# Patient Record
Sex: Female | Born: 1950 | Race: White | Hispanic: No | Marital: Married | Smoking: Former smoker
Health system: Southern US, Community
[De-identification: ages and names within clinical notes are randomized; demographics above are authoritative.]

## PROBLEM LIST (undated history)

## (undated) DIAGNOSIS — F329 Major depressive disorder, single episode, unspecified: Secondary | ICD-10-CM

## (undated) DIAGNOSIS — M797 Fibromyalgia: Secondary | ICD-10-CM

## (undated) DIAGNOSIS — J302 Other seasonal allergic rhinitis: Secondary | ICD-10-CM

## (undated) DIAGNOSIS — C801 Malignant (primary) neoplasm, unspecified: Secondary | ICD-10-CM

## (undated) DIAGNOSIS — K219 Gastro-esophageal reflux disease without esophagitis: Secondary | ICD-10-CM

## (undated) DIAGNOSIS — Z86718 Personal history of other venous thrombosis and embolism: Secondary | ICD-10-CM

## (undated) DIAGNOSIS — J449 Chronic obstructive pulmonary disease, unspecified: Secondary | ICD-10-CM

## (undated) DIAGNOSIS — R0602 Shortness of breath: Secondary | ICD-10-CM

## (undated) DIAGNOSIS — R51 Headache: Secondary | ICD-10-CM

## (undated) DIAGNOSIS — Z9981 Dependence on supplemental oxygen: Secondary | ICD-10-CM

## (undated) DIAGNOSIS — E785 Hyperlipidemia, unspecified: Secondary | ICD-10-CM

## (undated) DIAGNOSIS — I10 Essential (primary) hypertension: Secondary | ICD-10-CM

## (undated) DIAGNOSIS — F419 Anxiety disorder, unspecified: Secondary | ICD-10-CM

## (undated) DIAGNOSIS — E039 Hypothyroidism, unspecified: Secondary | ICD-10-CM

## (undated) DIAGNOSIS — J189 Pneumonia, unspecified organism: Secondary | ICD-10-CM

## (undated) DIAGNOSIS — F32A Depression, unspecified: Secondary | ICD-10-CM

## (undated) DIAGNOSIS — M419 Scoliosis, unspecified: Secondary | ICD-10-CM

## (undated) DIAGNOSIS — IMO0001 Reserved for inherently not codable concepts without codable children: Secondary | ICD-10-CM

## (undated) DIAGNOSIS — M1990 Unspecified osteoarthritis, unspecified site: Secondary | ICD-10-CM

## (undated) HISTORY — PX: KNEE ARTHROTOMY: SUR107

---
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
## 1975-11-24 HISTORY — PX: BARTHOLIN GLAND CYST EXCISION: SHX565

## 1986-11-23 HISTORY — PX: HEMORRHOID SURGERY: SHX153

## 1987-11-24 HISTORY — PX: DIAGNOSTIC LAPAROSCOPY: SUR761

## 1988-11-23 HISTORY — PX: ABDOMINAL HYSTERECTOMY: SHX81

## 1990-11-23 HISTORY — PX: OTHER SURGICAL HISTORY: SHX169

## 1999-03-11 ENCOUNTER — Ambulatory Visit (HOSPITAL_COMMUNITY): Admission: RE | Admit: 1999-03-11 | Discharge: 1999-03-11 | Payer: Self-pay | Admitting: Orthopedic Surgery

## 1999-08-28 ENCOUNTER — Ambulatory Visit (HOSPITAL_COMMUNITY): Admission: RE | Admit: 1999-08-28 | Discharge: 1999-08-28 | Payer: Self-pay | Admitting: Neurosurgery

## 1999-09-11 ENCOUNTER — Ambulatory Visit (HOSPITAL_COMMUNITY): Admission: RE | Admit: 1999-09-11 | Discharge: 1999-09-11 | Payer: Self-pay | Admitting: Neurosurgery

## 1999-10-02 ENCOUNTER — Ambulatory Visit (HOSPITAL_COMMUNITY): Admission: RE | Admit: 1999-10-02 | Discharge: 1999-10-02 | Payer: Self-pay | Admitting: Neurosurgery

## 1999-11-19 ENCOUNTER — Ambulatory Visit (HOSPITAL_COMMUNITY): Admission: RE | Admit: 1999-11-19 | Discharge: 1999-11-19 | Payer: Self-pay | Admitting: Gastroenterology

## 1999-11-19 ENCOUNTER — Encounter (INDEPENDENT_AMBULATORY_CARE_PROVIDER_SITE_OTHER): Payer: Self-pay | Admitting: *Deleted

## 2001-03-30 ENCOUNTER — Ambulatory Visit (HOSPITAL_COMMUNITY): Admission: RE | Admit: 2001-03-30 | Discharge: 2001-03-30 | Payer: Self-pay | Admitting: Gastroenterology

## 2003-04-24 ENCOUNTER — Encounter: Admission: RE | Admit: 2003-04-24 | Discharge: 2003-04-24 | Payer: Self-pay | Admitting: Orthopedic Surgery

## 2003-04-24 ENCOUNTER — Encounter: Payer: Self-pay | Admitting: Orthopedic Surgery

## 2004-11-01 ENCOUNTER — Emergency Department (HOSPITAL_COMMUNITY): Admission: EM | Admit: 2004-11-01 | Discharge: 2004-11-01 | Payer: Self-pay | Admitting: Emergency Medicine

## 2006-11-01 ENCOUNTER — Encounter: Admission: RE | Admit: 2006-11-01 | Discharge: 2006-11-01 | Payer: Self-pay | Admitting: Family Medicine

## 2006-11-22 ENCOUNTER — Encounter: Admission: RE | Admit: 2006-11-22 | Discharge: 2006-11-22 | Payer: Self-pay | Admitting: Family Medicine

## 2007-11-24 HISTORY — PX: THYROID LOBECTOMY: SHX420

## 2008-06-18 ENCOUNTER — Encounter: Admission: RE | Admit: 2008-06-18 | Discharge: 2008-06-18 | Payer: Self-pay | Admitting: Gastroenterology

## 2008-06-22 ENCOUNTER — Encounter: Admission: RE | Admit: 2008-06-22 | Discharge: 2008-06-22 | Payer: Self-pay | Admitting: Gastroenterology

## 2008-10-26 ENCOUNTER — Ambulatory Visit (HOSPITAL_BASED_OUTPATIENT_CLINIC_OR_DEPARTMENT_OTHER): Admission: RE | Admit: 2008-10-26 | Discharge: 2008-10-26 | Payer: Self-pay | Admitting: Orthopedic Surgery

## 2008-10-26 HISTORY — PX: KNEE ARTHROSCOPY: SUR90

## 2010-07-31 ENCOUNTER — Inpatient Hospital Stay (HOSPITAL_COMMUNITY): Admission: RE | Admit: 2010-07-31 | Discharge: 2010-08-05 | Payer: Self-pay | Admitting: Orthopedic Surgery

## 2010-07-31 HISTORY — PX: TOTAL KNEE ARTHROPLASTY: SHX125

## 2010-09-10 ENCOUNTER — Ambulatory Visit (HOSPITAL_BASED_OUTPATIENT_CLINIC_OR_DEPARTMENT_OTHER): Admission: RE | Admit: 2010-09-10 | Discharge: 2010-09-10 | Payer: Self-pay | Admitting: Orthopedic Surgery

## 2010-09-10 HISTORY — PX: OTHER SURGICAL HISTORY: SHX169

## 2010-10-28 ENCOUNTER — Encounter: Admission: RE | Admit: 2010-10-28 | Discharge: 2010-10-28 | Payer: Self-pay | Admitting: Orthopedic Surgery

## 2010-12-15 ENCOUNTER — Encounter: Payer: Self-pay | Admitting: Gastroenterology

## 2011-02-05 LAB — PROTIME-INR
INR: 1.41 (ref 0.00–1.49)
INR: 1.76 — ABNORMAL HIGH (ref 0.00–1.49)
INR: 2.38 — ABNORMAL HIGH (ref 0.00–1.49)
Prothrombin Time: 17.5 seconds — ABNORMAL HIGH (ref 11.6–15.2)

## 2011-02-05 LAB — CBC
HCT: 30.5 % — ABNORMAL LOW (ref 36.0–46.0)
HCT: 31.3 % — ABNORMAL LOW (ref 36.0–46.0)
Hemoglobin: 10.1 g/dL — ABNORMAL LOW (ref 12.0–15.0)
Hemoglobin: 10.5 g/dL — ABNORMAL LOW (ref 12.0–15.0)
Hemoglobin: 11.1 g/dL — ABNORMAL LOW (ref 12.0–15.0)
Hemoglobin: 12.4 g/dL (ref 12.0–15.0)
MCH: 30.9 pg (ref 26.0–34.0)
MCH: 31 pg (ref 26.0–34.0)
MCH: 31.4 pg (ref 26.0–34.0)
MCV: 91.2 fL (ref 78.0–100.0)
MCV: 93.1 fL (ref 78.0–100.0)
Platelets: 154 10*3/uL (ref 150–400)
Platelets: 173 10*3/uL (ref 150–400)
Platelets: 189 10*3/uL (ref 150–400)
RBC: 3.59 MIL/uL — ABNORMAL LOW (ref 3.87–5.11)
RBC: 4.01 MIL/uL (ref 3.87–5.11)
WBC: 10.3 10*3/uL (ref 4.0–10.5)
WBC: 14.1 10*3/uL — ABNORMAL HIGH (ref 4.0–10.5)
WBC: 16.1 10*3/uL — ABNORMAL HIGH (ref 4.0–10.5)

## 2011-02-05 LAB — RAPID URINE DRUG SCREEN, HOSP PERFORMED
Amphetamines: NOT DETECTED
Barbiturates: POSITIVE — AB
Benzodiazepines: POSITIVE — AB
Opiates: POSITIVE — AB

## 2011-02-05 LAB — BASIC METABOLIC PANEL
BUN: 6 mg/dL (ref 6–23)
Calcium: 8.2 mg/dL — ABNORMAL LOW (ref 8.4–10.5)
GFR calc Af Amer: >60 mL/min (ref >60)
GFR calc non Af Amer: >60 mL/min (ref >60)
Potassium: 3.7 mEq/L (ref 3.5–5.1)
Sodium: 141 mEq/L (ref 135–145)

## 2011-02-06 LAB — CBC
HCT: 42.4 % (ref 36.0–46.0)
Hemoglobin: 14.2 g/dL (ref 12.0–15.0)
MCV: 92.1 fL (ref 78.0–100.0)
RBC: 4.6 MIL/uL (ref 3.87–5.11)
RDW: 17.4 % — ABNORMAL HIGH (ref 11.5–15.5)
WBC: 13.9 10*3/uL — ABNORMAL HIGH (ref 4.0–10.5)

## 2011-02-06 LAB — COMPREHENSIVE METABOLIC PANEL
Albumin: 3.1 g/dL — ABNORMAL LOW (ref 3.5–5.2)
Alkaline Phosphatase: 112 U/L (ref 39–117)
BUN: 10 mg/dL (ref 6–23)
CO2: 35 mEq/L — ABNORMAL HIGH (ref 19–32)
Chloride: 102 mEq/L (ref 96–112)
GFR calc non Af Amer: >60 mL/min (ref >60)
Potassium: 4.7 mEq/L (ref 3.5–5.1)
Total Bilirubin: 0.3 mg/dL (ref 0.3–1.2)

## 2011-02-06 LAB — SURGICAL PCR SCREEN: Staphylococcus aureus: POSITIVE — AB

## 2011-04-07 NOTE — Op Note (Signed)
NAMEDONNETTE, MACMULLEN               ACCOUNT NO.:  [id number]   MEDICAL RECORD NO.:  [id number]          PATIENT TYPE:  AMB   LOCATION:  [facility]                         FACILITY:  [facility]   PHYSICIAN:  Marlowe Kays, M.D.  DATE OF BIRTH:  08/31/1951   DATE OF PROCEDURE:  10/26/2008  DATE OF DISCHARGE:                               OPERATIVE REPORT   PREOPERATIVE DIAGNOSES:  1. Two retained painful tibial screws at tibial tubercle.  2. Internal derangement right knee with suspected lateral loose body.   POSTOPERATIVE DIAGNOSES:  1. Osteoarthritis.  2. Torn medial and lateral menisci.  3. Large osteophyte/fibrous attached loose body lateral patella.  4. Retained painful tibial screws, right knee.   OPERATION:  1. Right knee arthroscopy with partial medial and lateral meniscectomy      and removal of the large osteophyte/loose body of patella.  2. Removal of 2 screws, proximal tibia.   SURGEON:  Dr. Simonne Come.   ASSISTANT:  Nurse.   ANESTHESIA:  General.   PATHOLOGY/JUSTIFICATION FOR PROCEDURE:  She has had 2 prior orthopedic  procedures on her right knee with at age 69 and the last 9 years ago at  age 33 when she had a transfer of the tibial tubercle.  She has 2  painful screws there.  Also has a very protruding bony fragment from the  lateral patella which appeared to be intra-articular.  She also has  chronic pain problems with fibromyalgia.  She is having enough pain,  however, from these 2 that I felt that surgical correction was  appropriate.   PROCEDURE:  After satisfactory general anesthesia, Ace wrap and knee  stabilizer to the left lower extremity, right lower extremity tourniquet  applied and the leg was Esmarched out nonsterilely.  Thigh stabilizer  applied and the leg prepped with DuraPrep from stabilizer to ankle and  draped in sterile field.  Superior medial saline inflow, first through  an anteromedial portal, the lateral compartment knee joint was  evaluated.   She had either calcium or steroid stippling over a  thoroughly worn lateral femoral condyle.  Also, this was coating her  entire lateral meniscus which was badly torn throughout the posterior  half.  I resected this back to stable rim with baskets and shaved it  down until smooth with a 3.5 shaver.  Looking at the lateral gutter and  suprapatellar area, she had a large bony fragment from the lateral  patella which was movable with a fibrous union to the patella.  I  removed this using both anterior medial anterolateral portals with a  combination of baskets, 4-mm oval bur, and the 3.5 shaver with pre- and  post-films being taken.  She had full-thickness wear of her patella  which did not require shaving.  I then reversed portals.  In the medial  joint, she had a good bit of synovitis anteromedially.  She had a tear  with some of the same calcium-type deposition of the posterior curve and  posterior horn which I smoothed back to stable rim with a 3.5 shaver.  I  looked in the anterior joint from  both portals and did not find any  fragments, and I think the bone projection seen on plain x-rays was an  osteophyte and not in the joint and not causing any mechanical problems.  I then irrigated her knee joint until clear and closed the 2 anterior  portals with a 4-0 nylon.  I then injected through the inflow apparatus  20 mL of 0.50% Marcaine with adrenaline and 4 mg of morphine and closed  this portal with 4-0 nylon as well.  I then made 2 vertical incisions  over the 2 protuberant screws and removed them.  They will be safe for  the patient.  These wounds were likewise infiltrated with Marcaine and  morphine and closed with interrupted 4-0 nylon.  Betadine, Adaptic, and  dry dressing were applied.  She was given 30 mg of Toradol IV, placed in  a knee immobilizer, and at the time of this dictation was on her way to  recovery in satisfactory condition with no known complications.            ______________________________  Marlowe Kays, M.D.     JA/MEDQ  D:  10/26/2008  T:  10/26/2008  Job:  [id number]

## 2011-05-12 ENCOUNTER — Other Ambulatory Visit (HOSPITAL_COMMUNITY): Payer: Self-pay | Admitting: Family Medicine

## 2011-05-12 DIAGNOSIS — M898X1 Other specified disorders of bone, shoulder: Secondary | ICD-10-CM

## 2011-05-20 ENCOUNTER — Encounter (HOSPITAL_COMMUNITY)
Admission: RE | Admit: 2011-05-20 | Discharge: 2011-05-20 | Disposition: A | Discharge status: Home / Self Care | Payer: 59 | Source: Ambulatory Visit | Attending: Family Medicine | Admitting: Family Medicine

## 2011-05-20 DIAGNOSIS — M898X1 Other specified disorders of bone, shoulder: Secondary | ICD-10-CM

## 2011-05-20 DIAGNOSIS — M25519 Pain in unspecified shoulder: Secondary | ICD-10-CM | POA: Insufficient documentation

## 2011-05-20 MED ORDER — TECHNETIUM TC 99M MEDRONATE IV KIT
25.0000 | PACK | Freq: Once | INTRAVENOUS | Status: AC | PRN
  Administered 2011-05-20: 25 via INTRAVENOUS

## 2011-08-28 LAB — POCT HEMOGLOBIN-HEMACUE: Hemoglobin: 15.4 g/dL — ABNORMAL HIGH (ref 12.0–15.0)

## 2011-09-28 DIAGNOSIS — J383 Other diseases of vocal cords: Secondary | ICD-10-CM | POA: Insufficient documentation

## 2011-09-28 DIAGNOSIS — R490 Dysphonia: Secondary | ICD-10-CM | POA: Insufficient documentation

## 2012-02-23 ENCOUNTER — Other Ambulatory Visit: Payer: Self-pay | Admitting: Orthopedic Surgery

## 2012-02-23 ENCOUNTER — Encounter (HOSPITAL_COMMUNITY): Payer: Self-pay | Admitting: Pharmacy Technician

## 2012-02-29 ENCOUNTER — Encounter (HOSPITAL_COMMUNITY): Payer: Self-pay

## 2012-02-29 ENCOUNTER — Encounter (HOSPITAL_COMMUNITY)
Admission: RE | Admit: 2012-02-29 | Discharge: 2012-02-29 | Disposition: A | Discharge status: Home / Self Care | Payer: 59 | Source: Ambulatory Visit | Attending: Orthopedic Surgery | Admitting: Orthopedic Surgery

## 2012-02-29 ENCOUNTER — Ambulatory Visit (HOSPITAL_COMMUNITY)
Admission: RE | Admit: 2012-02-29 | Discharge: 2012-02-29 | Disposition: A | Discharge status: Home / Self Care | Payer: 59 | Source: Ambulatory Visit | Attending: Orthopedic Surgery | Admitting: Orthopedic Surgery

## 2012-02-29 ENCOUNTER — Ambulatory Visit (HOSPITAL_COMMUNITY): Payer: 59

## 2012-02-29 ENCOUNTER — Other Ambulatory Visit: Payer: Self-pay

## 2012-02-29 DIAGNOSIS — J4489 Other specified chronic obstructive pulmonary disease: Secondary | ICD-10-CM | POA: Insufficient documentation

## 2012-02-29 DIAGNOSIS — Z01812 Encounter for preprocedural laboratory examination: Secondary | ICD-10-CM | POA: Insufficient documentation

## 2012-02-29 DIAGNOSIS — Z01818 Encounter for other preprocedural examination: Secondary | ICD-10-CM | POA: Insufficient documentation

## 2012-02-29 DIAGNOSIS — M4120 Other idiopathic scoliosis, site unspecified: Secondary | ICD-10-CM | POA: Insufficient documentation

## 2012-02-29 DIAGNOSIS — J984 Other disorders of lung: Secondary | ICD-10-CM | POA: Insufficient documentation

## 2012-02-29 DIAGNOSIS — Z01810 Encounter for preprocedural cardiovascular examination: Secondary | ICD-10-CM | POA: Insufficient documentation

## 2012-02-29 DIAGNOSIS — R9431 Abnormal electrocardiogram [ECG] [EKG]: Secondary | ICD-10-CM | POA: Insufficient documentation

## 2012-02-29 DIAGNOSIS — J449 Chronic obstructive pulmonary disease, unspecified: Secondary | ICD-10-CM | POA: Insufficient documentation

## 2012-02-29 DIAGNOSIS — I498 Other specified cardiac arrhythmias: Secondary | ICD-10-CM | POA: Insufficient documentation

## 2012-02-29 HISTORY — DX: Hyperlipidemia, unspecified: E78.5

## 2012-02-29 HISTORY — DX: Fibromyalgia: M79.7

## 2012-02-29 HISTORY — DX: Hypothyroidism, unspecified: E03.9

## 2012-02-29 HISTORY — DX: Other seasonal allergic rhinitis: J30.2

## 2012-02-29 HISTORY — DX: Major depressive disorder, single episode, unspecified: F32.9

## 2012-02-29 HISTORY — DX: Shortness of breath: R06.02

## 2012-02-29 HISTORY — DX: Headache: R51

## 2012-02-29 HISTORY — DX: Depression, unspecified: F32.A

## 2012-02-29 HISTORY — DX: Pneumonia, unspecified organism: J18.9

## 2012-02-29 HISTORY — DX: Anxiety disorder, unspecified: F41.9

## 2012-02-29 HISTORY — DX: Gastro-esophageal reflux disease without esophagitis: K21.9

## 2012-02-29 HISTORY — DX: Chronic obstructive pulmonary disease, unspecified: J44.9

## 2012-02-29 LAB — CBC
MCH: 30 pg (ref 26.0–34.0)
MCHC: 32.6 g/dL (ref 30.0–36.0)
Platelets: 252 10*3/uL (ref 150–400)

## 2012-02-29 LAB — SURGICAL PCR SCREEN
MRSA, PCR: POSITIVE — AB
Staphylococcus aureus: POSITIVE — AB

## 2012-02-29 LAB — BASIC METABOLIC PANEL
Calcium: 8.5 mg/dL (ref 8.4–10.5)
GFR calc Af Amer: >90 mL/min (ref >90)
GFR calc non Af Amer: >90 mL/min (ref >90)
Sodium: 143 mEq/L (ref 135–145)

## 2012-02-29 NOTE — Patient Instructions (Signed)
[address]  02/29/2012   Your procedure is scheduled on:  03/02/12   Surgery    1200-1330  Wednesday      Report to [facility] at     1000  AM.  Call this number if you have problems the morning of surgery: [phone]     Or PST   [id number]  Deliah Goody   Remember:              Janan Halter WITH YOU TO HOSPITAL  Do not eat food:After Midnight.tuesday night  May have clear liquids: until midnight Tuesday NIGHT  Clear liquids include soda, tea, black coffee, apple or grape juice, broth.  Take these medicines the morning of surgery with A SIP OF WATER:  ACIPHEX,SYNTHROID,WELLBUTRIN, ALLEGRA, LEXAPRO, NEURONTIN                              MAY TAKE   MORPHINE, IR, ROBAXIN, FENTANYL patch-      MAY TAKE KLONIPIN  IF DR APLINGTON HAS NOT GIVEN ANY OTHER ANXIETY MED   Do not wear jewelry, make-up or nail polish.  Do not wear lotions, powders, or perfumes. You may wear deodorant.  Do not shave 48 hours prior to surgery.  Do not bring valuables to the hospital.  Contacts, dentures or bridgework may not be worn into surgery.  Leave suitcase in the car. After surgery it may be brought to your room.  For patients admitted to the hospital, checkout time is 11:00 AM the day of discharge.   Patients discharged the day of surgery will not be allowed to drive home.  Name and phone number of your driver:                                                                      Special Instructions: CHG Shower Use Special Wash: 1/2 bottle night before surgery and 1/2 bottle morning of surgery. REGULAR SOAP FACE AND PRIVATES              LADIES- NO SHAVING 48 HOURS BEFORE USING BETASEPT SOAP.                 MEN-MAY SHAVE FACE MORNING OF SURGERY  Please read over the following fact sheets that you were given: MRSA Information

## 2012-03-01 ENCOUNTER — Encounter (HOSPITAL_COMMUNITY): Payer: Self-pay

## 2012-03-01 NOTE — Pre-Procedure Instructions (Signed)
Spoke with husband at his request- states [name] was sleeping- notified him of pos nasal swab and she needs to  Begin Mupirocin today as instructed and utilize good handwashing

## 2012-03-01 NOTE — Pre-Procedure Instructions (Signed)
Preliminary EKG report reviewed with old EKG from 8/11 pre Dr Payton Doughty -   No final copy as of yet-   OK for OR

## 2012-03-02 ENCOUNTER — Encounter (HOSPITAL_COMMUNITY)
Admission: RE | Disposition: A | Discharge status: Home / Self Care | Payer: Self-pay | Source: Ambulatory Visit | Attending: Orthopedic Surgery

## 2012-03-02 ENCOUNTER — Ambulatory Visit (HOSPITAL_COMMUNITY): Payer: 59 | Admitting: Anesthesiology

## 2012-03-02 ENCOUNTER — Ambulatory Visit (HOSPITAL_COMMUNITY)
Admission: RE | Admit: 2012-03-02 | Discharge: 2012-03-03 | Disposition: A | Discharge status: Home / Self Care | Payer: 59 | Source: Ambulatory Visit | Attending: Orthopedic Surgery | Admitting: Orthopedic Surgery

## 2012-03-02 ENCOUNTER — Encounter (HOSPITAL_COMMUNITY): Payer: Self-pay | Admitting: Anesthesiology

## 2012-03-02 ENCOUNTER — Encounter (HOSPITAL_COMMUNITY): Payer: Self-pay | Admitting: *Deleted

## 2012-03-02 DIAGNOSIS — J4489 Other specified chronic obstructive pulmonary disease: Secondary | ICD-10-CM | POA: Insufficient documentation

## 2012-03-02 DIAGNOSIS — Z9889 Other specified postprocedural states: Secondary | ICD-10-CM

## 2012-03-02 DIAGNOSIS — M25819 Other specified joint disorders, unspecified shoulder: Secondary | ICD-10-CM | POA: Insufficient documentation

## 2012-03-02 DIAGNOSIS — M719 Bursopathy, unspecified: Secondary | ICD-10-CM | POA: Insufficient documentation

## 2012-03-02 DIAGNOSIS — Z79899 Other long term (current) drug therapy: Secondary | ICD-10-CM | POA: Insufficient documentation

## 2012-03-02 DIAGNOSIS — E039 Hypothyroidism, unspecified: Secondary | ICD-10-CM | POA: Insufficient documentation

## 2012-03-02 DIAGNOSIS — E785 Hyperlipidemia, unspecified: Secondary | ICD-10-CM | POA: Insufficient documentation

## 2012-03-02 DIAGNOSIS — M67919 Unspecified disorder of synovium and tendon, unspecified shoulder: Secondary | ICD-10-CM | POA: Insufficient documentation

## 2012-03-02 DIAGNOSIS — K219 Gastro-esophageal reflux disease without esophagitis: Secondary | ICD-10-CM | POA: Insufficient documentation

## 2012-03-02 DIAGNOSIS — J449 Chronic obstructive pulmonary disease, unspecified: Secondary | ICD-10-CM | POA: Insufficient documentation

## 2012-03-02 DIAGNOSIS — M24119 Other articular cartilage disorders, unspecified shoulder: Secondary | ICD-10-CM | POA: Insufficient documentation

## 2012-03-02 DIAGNOSIS — IMO0001 Reserved for inherently not codable concepts without codable children: Secondary | ICD-10-CM | POA: Insufficient documentation

## 2012-03-02 HISTORY — PX: OTHER SURGICAL HISTORY: SHX169

## 2012-03-02 SURGERY — SHOULDER ARTHROSCOPY WITH SUBACROMIAL DECOMPRESSION
Anesthesia: General | Site: Shoulder | Laterality: Left | Wound class: Clean

## 2012-03-02 MED ORDER — LACTATED RINGERS IV SOLN
INTRAVENOUS | Status: DC
  Administered 2012-03-02: 1000 mL via INTRAVENOUS

## 2012-03-02 MED ORDER — LORATADINE 10 MG PO TABS
10.0000 mg | ORAL_TABLET | Freq: Every day | ORAL | Status: DC
  Filled 2012-03-02 (×2): qty 1

## 2012-03-02 MED ORDER — MORPHINE SULFATE 15 MG PO TABS
15.0000 mg | ORAL_TABLET | ORAL | Status: DC | PRN
  Administered 2012-03-03: 15 mg via ORAL
  Filled 2012-03-02: qty 1

## 2012-03-02 MED ORDER — PHENYLEPHRINE HCL 10 MG/ML IJ SOLN
INTRAMUSCULAR | Status: DC | PRN
  Administered 2012-03-02: 80 ug via INTRAVENOUS
  Administered 2012-03-02: 160 ug via INTRAVENOUS

## 2012-03-02 MED ORDER — PROPOFOL 10 MG/ML IV EMUL
INTRAVENOUS | Status: DC | PRN
  Administered 2012-03-02: 200 mg via INTRAVENOUS

## 2012-03-02 MED ORDER — FENTANYL 75 MCG/HR TD PT72: 75.0000 ug | MEDICATED_PATCH | TRANSDERMAL | Status: DC

## 2012-03-02 MED ORDER — ONDANSETRON HCL 4 MG/2ML IJ SOLN
INTRAMUSCULAR | Status: DC | PRN
  Administered 2012-03-02: 4 mg via INTRAVENOUS

## 2012-03-02 MED ORDER — SUMATRIPTAN SUCCINATE 50 MG PO TABS
50.0000 mg | ORAL_TABLET | Freq: Once | ORAL | Status: AC
  Administered 2012-03-02: 50 mg via ORAL
  Filled 2012-03-02: qty 1

## 2012-03-02 MED ORDER — ACETAMINOPHEN 650 MG RE SUPP: 650.0000 mg | Freq: Four times a day (QID) | RECTAL | Status: DC | PRN

## 2012-03-02 MED ORDER — ACETAMINOPHEN 325 MG PO TABS: 650.0000 mg | ORAL_TABLET | Freq: Four times a day (QID) | ORAL | Status: DC | PRN

## 2012-03-02 MED ORDER — EPINEPHRINE HCL 1 MG/ML IJ SOLN
INTRAMUSCULAR | Status: AC
  Filled 2012-03-02: qty 2

## 2012-03-02 MED ORDER — LEVOTHYROXINE SODIUM 25 MCG PO TABS
25.0000 ug | ORAL_TABLET | Freq: Every day | ORAL | Status: DC
  Administered 2012-03-03: 25 ug via ORAL
  Filled 2012-03-02: qty 1

## 2012-03-02 MED ORDER — PHENOL 1.4 % MT LIQD: 1.0000 | OROMUCOSAL | Status: DC | PRN

## 2012-03-02 MED ORDER — EPINEPHRINE HCL 1 MG/ML IJ SOLN
INTRAMUSCULAR | Status: DC | PRN
  Administered 2012-03-02: 1 mg

## 2012-03-02 MED ORDER — PROPOFOL 10 MG/ML IV EMUL
INTRAVENOUS | Status: DC | PRN
  Administered 2012-03-02: 50 ug/kg/min via INTRAVENOUS

## 2012-03-02 MED ORDER — ONDANSETRON HCL 4 MG PO TABS: 4.0000 mg | ORAL_TABLET | Freq: Four times a day (QID) | ORAL | Status: DC | PRN

## 2012-03-02 MED ORDER — CLONAZEPAM 0.5 MG PO TABS
0.5000 mg | ORAL_TABLET | Freq: Two times a day (BID) | ORAL | Status: DC | PRN
  Administered 2012-03-02: 0.5 mg via ORAL
  Filled 2012-03-02: qty 1

## 2012-03-02 MED ORDER — HYDROMORPHONE HCL PF 1 MG/ML IJ SOLN
INTRAMUSCULAR | Status: AC
  Filled 2012-03-02: qty 1

## 2012-03-02 MED ORDER — MEPERIDINE HCL 50 MG/ML IJ SOLN: 6.2500 mg | INTRAMUSCULAR | Status: DC | PRN

## 2012-03-02 MED ORDER — DEXTROSE IN LACTATED RINGERS 5 % IV SOLN
INTRAVENOUS | Status: DC
  Administered 2012-03-02: 1000 mL via INTRAVENOUS

## 2012-03-02 MED ORDER — ONDANSETRON HCL 4 MG/2ML IJ SOLN: 4.0000 mg | Freq: Four times a day (QID) | INTRAMUSCULAR | Status: DC | PRN

## 2012-03-02 MED ORDER — SUFENTANIL CITRATE 50 MCG/ML IV SOLN
INTRAVENOUS | Status: DC | PRN
  Administered 2012-03-02: 10 ug via INTRAVENOUS
  Administered 2012-03-02: 5 ug via INTRAVENOUS
  Administered 2012-03-02 (×2): 10 ug via INTRAVENOUS

## 2012-03-02 MED ORDER — PROMETHAZINE HCL 25 MG/ML IJ SOLN: 6.2500 mg | INTRAMUSCULAR | Status: DC | PRN

## 2012-03-02 MED ORDER — MIDAZOLAM HCL 2 MG/2ML IJ SOLN
INTRAMUSCULAR | Status: AC
  Filled 2012-03-02: qty 2

## 2012-03-02 MED ORDER — LIDOCAINE HCL (CARDIAC) 20 MG/ML IV SOLN
INTRAVENOUS | Status: DC | PRN
  Administered 2012-03-02: 50 mg via INTRAVENOUS

## 2012-03-02 MED ORDER — CEFAZOLIN SODIUM 1-5 GM-% IV SOLN
1.0000 g | INTRAVENOUS | Status: AC
  Administered 2012-03-02: 1 g via INTRAVENOUS

## 2012-03-02 MED ORDER — GABAPENTIN 300 MG PO CAPS
300.0000 mg | ORAL_CAPSULE | Freq: Two times a day (BID) | ORAL | Status: DC
  Administered 2012-03-02: 300 mg via ORAL
  Filled 2012-03-02 (×3): qty 1

## 2012-03-02 MED ORDER — MENTHOL 3 MG MT LOZG: 1.0000 | LOZENGE | OROMUCOSAL | Status: DC | PRN

## 2012-03-02 MED ORDER — METHOCARBAMOL 500 MG PO TABS
750.0000 mg | ORAL_TABLET | ORAL | Status: DC | PRN
  Administered 2012-03-02 – 2012-03-03 (×4): 750 mg via ORAL
  Filled 2012-03-02: qty 1
  Filled 2012-03-02: qty 1.5
  Filled 2012-03-02: qty 2
  Filled 2012-03-02: qty 1
  Filled 2012-03-02: qty 2

## 2012-03-02 MED ORDER — OXYCODONE HCL 5 MG PO TABS
5.0000 mg | ORAL_TABLET | ORAL | Status: DC | PRN
  Administered 2012-03-02 – 2012-03-03 (×6): 10 mg via ORAL
  Filled 2012-03-02 (×6): qty 2

## 2012-03-02 MED ORDER — SUCCINYLCHOLINE CHLORIDE 20 MG/ML IJ SOLN
INTRAMUSCULAR | Status: DC | PRN
  Administered 2012-03-02: 80 mg via INTRAVENOUS

## 2012-03-02 MED ORDER — METOCLOPRAMIDE HCL 10 MG PO TABS: 5.0000 mg | ORAL_TABLET | Freq: Three times a day (TID) | ORAL | Status: DC | PRN

## 2012-03-02 MED ORDER — ALBUTEROL SULFATE HFA 108 (90 BASE) MCG/ACT IN AERS
2.0000 | INHALATION_SPRAY | Freq: Four times a day (QID) | RESPIRATORY_TRACT | Status: DC | PRN
  Filled 2012-03-02: qty 6.7

## 2012-03-02 MED ORDER — HYDROMORPHONE HCL PF 1 MG/ML IJ SOLN
0.2500 mg | INTRAMUSCULAR | Status: DC | PRN
  Administered 2012-03-02 (×4): 0.5 mg via INTRAVENOUS

## 2012-03-02 MED ORDER — HYDROMORPHONE HCL PF 1 MG/ML IJ SOLN
INTRAMUSCULAR | Status: DC | PRN
  Administered 2012-03-02 (×2): 1 mg via INTRAVENOUS

## 2012-03-02 MED ORDER — LACTATED RINGERS IV SOLN: INTRAVENOUS | Status: DC

## 2012-03-02 MED ORDER — KETAMINE HCL 10 MG/ML IJ SOLN
INTRAMUSCULAR | Status: DC | PRN
  Administered 2012-03-02: 30 mg via INTRAVENOUS

## 2012-03-02 MED ORDER — ESCITALOPRAM OXALATE 20 MG PO TABS
20.0000 mg | ORAL_TABLET | Freq: Every morning | ORAL | Status: DC
  Filled 2012-03-02: qty 1

## 2012-03-02 MED ORDER — QUETIAPINE FUMARATE 100 MG PO TABS
100.0000 mg | ORAL_TABLET | Freq: Every day | ORAL | Status: DC
  Administered 2012-03-02: 100 mg via ORAL
  Filled 2012-03-02 (×2): qty 1

## 2012-03-02 MED ORDER — MIDAZOLAM HCL 2 MG/2ML IJ SOLN
1.0000 mg | INTRAMUSCULAR | Status: DC | PRN
  Administered 2012-03-02: 2 mg via INTRAVENOUS

## 2012-03-02 MED ORDER — METOCLOPRAMIDE HCL 5 MG/ML IJ SOLN: 5.0000 mg | Freq: Three times a day (TID) | INTRAMUSCULAR | Status: DC | PRN

## 2012-03-02 MED ORDER — PANTOPRAZOLE SODIUM 40 MG PO TBEC
40.0000 mg | DELAYED_RELEASE_TABLET | Freq: Every day | ORAL | Status: DC
  Filled 2012-03-02: qty 1

## 2012-03-02 MED ORDER — BUPIVACAINE-EPINEPHRINE (PF) 0.5% -1:200000 IJ SOLN
INTRAMUSCULAR | Status: AC
  Filled 2012-03-02: qty 10

## 2012-03-02 MED ORDER — POVIDONE-IODINE 7.5 % EX SOLN: Freq: Once | CUTANEOUS | Status: DC

## 2012-03-02 MED ORDER — MIDAZOLAM HCL 5 MG/5ML IJ SOLN
INTRAMUSCULAR | Status: DC | PRN
  Administered 2012-03-02 (×3): 2 mg via INTRAVENOUS

## 2012-03-02 MED ORDER — CEFAZOLIN SODIUM 1-5 GM-% IV SOLN
INTRAVENOUS | Status: AC
  Filled 2012-03-02: qty 50

## 2012-03-02 MED ORDER — BUPIVACAINE-EPINEPHRINE 0.5% -1:200000 IJ SOLN
INTRAMUSCULAR | Status: DC | PRN
  Administered 2012-03-02: 30 mL

## 2012-03-02 MED ORDER — CEFAZOLIN SODIUM 1-5 GM-% IV SOLN
1.0000 g | Freq: Four times a day (QID) | INTRAVENOUS | Status: AC
  Administered 2012-03-02 – 2012-03-03 (×3): 1 g via INTRAVENOUS
  Filled 2012-03-02 (×3): qty 50

## 2012-03-02 MED ORDER — LACTATED RINGERS IR SOLN
Status: DC | PRN
  Administered 2012-03-02 (×2): 3000 mL

## 2012-03-02 SURGICAL SUPPLY — 32 items
BLADE 4.2CUDA (BLADE) ×2 IMPLANT
BLADE SURG SZ11 CARB STEEL (BLADE) ×2 IMPLANT
BOOTIES KNEE HIGH SLOAN (MISCELLANEOUS) ×4 IMPLANT
BUR OVAL 4.0 (BURR) ×2 IMPLANT
CLOTH BEACON ORANGE TIMEOUT ST (SAFETY) ×2 IMPLANT
DRAPE SHOULDER BEACH CHAIR (DRAPES) ×2 IMPLANT
DRAPE U-SHAPE 47X51 STRL (DRAPES) ×2 IMPLANT
DRSG EMULSION OIL 3X3 NADH (GAUZE/BANDAGES/DRESSINGS) ×1 IMPLANT
DRSG PAD ABDOMINAL 8X10 ST (GAUZE/BANDAGES/DRESSINGS) ×1 IMPLANT
DURAPREP 26ML APPLICATOR (WOUND CARE) ×2 IMPLANT
GLOVE BIO SURGEON STRL SZ7.5 (GLOVE) ×2 IMPLANT
GLOVE BIO SURGEON STRL SZ8 (GLOVE) ×4 IMPLANT
GLOVE BIOGEL M 8.0 STRL (GLOVE) ×2 IMPLANT
GLOVE ECLIPSE 8.0 STRL XLNG CF (GLOVE) ×2 IMPLANT
GLOVE INDICATOR 8.0 STRL GRN (GLOVE) ×4 IMPLANT
GOWN STRL REIN XL XLG (GOWN DISPOSABLE) ×4 IMPLANT
KIT POSITION SHOULDER SCHLEI (MISCELLANEOUS) ×2 IMPLANT
MANIFOLD NEPTUNE II (INSTRUMENTS) ×2 IMPLANT
NDL SPNL 18GX3.5 QUINCKE PK (NEEDLE) ×1 IMPLANT
NEEDLE SPNL 18GX3.5 QUINCKE PK (NEEDLE) ×2 IMPLANT
PACK SHOULDER CUSTOM OPM052 (CUSTOM PROCEDURE TRAY) ×2 IMPLANT
POSITIONER SURGICAL ARM (MISCELLANEOUS) ×2 IMPLANT
SET ARTHROSCOPY TUBING (MISCELLANEOUS) ×2
SET ARTHROSCOPY TUBING LN (MISCELLANEOUS) ×1 IMPLANT
SLING ARM IMMOBILIZER LRG (SOFTGOODS) IMPLANT
SLING ARM IMMOBILIZER MED (SOFTGOODS) ×2 IMPLANT
SPONGE GAUZE 4X4 12PLY (GAUZE/BANDAGES/DRESSINGS) ×1 IMPLANT
STRAP CHIN BCCS-OSI (MISCELLANEOUS) ×2 IMPLANT
SUT ETHILON 4 0 PS 2 18 (SUTURE) ×2 IMPLANT
TAPE CLOTH SURG 6X10 WHT LF (GAUZE/BANDAGES/DRESSINGS) ×1 IMPLANT
TUBING CONNECTING 10 (TUBING) ×2 IMPLANT
WAND 90 DEG TURBOVAC W/CORD (SURGICAL WAND) ×2 IMPLANT

## 2012-03-02 NOTE — Brief Op Note (Signed)
03/02/2012  2:14 PM  PATIENT:  [name]  61 y.o. female  PRE-OPERATIVE DIAGNOSIS:  left shoulder labral tear and rotator cuff tendinosis due to impingemen  POST-OPERATIVE DIAGNOSIS: same  PROCEDURE:  Procedure(s) (LRB): SHOULDER ARTHROSCOPY WITH SUBACROMIAL DECOMPRESSION (Left) and labral debridement  SURGEON:  Surgeon(s) and Role:    * Illene Labrador Ehsan Corvin MD - Primary  PHYSICIAN ASSISTANT:   ASSISTANTS: Chinita Pester [facility] ANESTHESIA:   general  EBL:  Total I/O In: 1000 [I.V.:1000] Out: -   BLOOD ADMINISTERED:none  DRAINS: none   LOCAL MEDICATIONS USED:  MARCAINE     SPECIMEN:  No Specimen  DISPOSITION OF SPECIMEN:  N/A  COUNTS:  YES  TOURNIQUET:  * No tourniquets in log *  DICTATION: .Other Dictation: Dictation Number [id number]  PLAN OF CARE: Admit for overnight observation  PATIENT DISPOSITION:  PACU - hemodynamically stable.   Delay start of Pharmacological VTE agent (>24hrs) due to surgical blood loss or risk of bleeding: not applicable

## 2012-03-02 NOTE — H&P (Signed)
[name] is an 61 y.o. female.   Chief Complaint: Pain in L. Shoulder  [id number] pain with ROM L. Shoulder. MRI shows labral tear and impingment.  Past Medical History  Diagnosis Date  . Peripheral vascular disease 1972    DVT RIGHT LEG requiring antioagulation  . Asthma   . Shortness of breath     oxygen at 2 liters at night  . Pneumonia 2008  . Hypothyroidism   . GERD (gastroesophageal reflux disease)   . Headache     migraines  . Neuromuscular disorder     PCP Dr Consuella Lose Griffin/   FIBROMYALGIA  . Depression   . Seasonal allergies     sgtates clear nasal drainage x 3- days- states no fever  . Anxiety     states frequent  anxiety attacks  . Fibromyalgia     states has had trigger point injections shoulders- ? last time  . Arthritis     CHRONIC FATIGUE, " abnormally curved spine"-  . COPD (chronic obstructive pulmonary disease)     LOV with PFT 1/13  Dr Cherie Ouch on chart-   pt states no changes since last saw pulmonary  . Hyperlipidemia     Past Surgical History  Procedure Date  . Tonsillectomy   . Joint replacement 2010    right knee  . Bartholin gland cyst excision 1975  . Thyroid lobectomy 2009    left lobectomy  . Diagnostic laparoscopy     x2-3 with ovarian cystectomy  . Abdominal hysterectomy     bilateral salpingo-oophorectomy  . Knee arthrotomy     right x 3 prior to replacement    History reviewed. No pertinent family history. Social History:  reports that she has been smoking Cigarettes.  She has a 86 pack-year smoking history. She has never used smokeless tobacco. She reports that she does not drink alcohol or use illicit drugs.  Allergies:  Allergies  Allergen Reactions  . Codeine Nausea And Vomiting  . Penicillins Other (See Comments)    Family can not take  . Tylenol (Acetaminophen) Other (See Comments)    Can not take with neurontin   . Vistaril (Hydroxyzine Hcl) Other (See Comments)     Hallucinations    Medications  Prior to Admission  Medication Dose Route Frequency Provider Last Rate Last Dose  . ceFAZolin (ANCEF) IVPB 1 g/50 mL premix  1 g Intravenous 60 min Pre-Op Illene Labrador Lamia Mariner, MD      . lactated ringers infusion   Intravenous Continuous Phillips Grout, MD 100 mL/hr at 03/02/12 1115 1,000 mL at 03/02/12 1115  . midazolam (VERSED) injection 1-2 mg  1-2 mg Intravenous PRN Phillips Grout, MD   2 mg at 03/02/12 1130  . povidone-iodine (BETADINE) 7.5 % scrub   Topical Once Drucilla Schmidt, MD       Medications Prior to Admission  Medication Sig Dispense Refill  . escitalopram (LEXAPRO) 20 MG tablet Take 20 mg by mouth every morning.        Results for orders placed during the hospital encounter of 02/29/12 (from the past 48 hour(s))  SURGICAL PCR SCREEN     Status: Abnormal   Collection Time   02/29/12  2:01 PM      Component Value Range Comment   MRSA, PCR POSITIVE (*) NEGATIVE     Staphylococcus aureus POSITIVE (*) NEGATIVE    CBC     Status: Abnormal   Collection Time   02/29/12  3:25 PM  Component Value Range Comment   WBC 8.7  4.0 - 10.5 (K/uL)    RBC 4.66  3.87 - 5.11 (MIL/uL)    Hemoglobin 14.0  12.0 - 15.0 (g/dL)    HCT 98.1  19.1 - 47.8 (%)    MCV 92.3  78.0 - 100.0 (fL)    MCH 30.0  26.0 - 34.0 (pg)    MCHC 32.6  30.0 - 36.0 (g/dL)    RDW 29.5 (*) 62.1 - 15.5 (%)    Platelets 252  150 - 400 (K/uL)   BASIC METABOLIC PANEL     Status: Abnormal   Collection Time   02/29/12  3:25 PM      Component Value Range Comment   Sodium 143  135 - 145 (mEq/L)    Potassium 4.6  3.5 - 5.1 (mEq/L)    Chloride 104  96 - 112 (mEq/L)    CO2 33 (*) 19 - 32 (mEq/L)    Glucose, Bld 68 (*) 70 - 99 (mg/dL)    BUN 11  6 - 23 (mg/dL)    Creatinine, Ser 3.08  0.50 - 1.10 (mg/dL)    Calcium 8.5  8.4 - 10.5 (mg/dL)    GFR calc non Af Amer >90  >90 (mL/min)    GFR calc Af Amer >90  >90 (mL/min)    Dg Chest 2 View  02/29/2012  *RADIOLOGY REPORT*  Clinical Data: Preoperative respiratory examination  for shoulder surgery.  History of asthma and COPD.  CHEST - 2 VIEW  Comparison: 11/01/2006.  Findings: The heart size and mediastinal contours are stable. There is a moderate biconvex thoracolumbar scoliosis.  There is mild scarring in the left perihilar region with mild blunting of the left costophrenic angle.  No significant pleural effusion is seen and the right lung is clear.  IMPRESSION: Left perihilar scarring and thoracolumbar scoliosis.  No acute cardiopulmonary process.  Original Report Authenticated By: Gerrianne Scale, M.D.    Review of Systems  Constitutional: Negative.   HENT: Negative.   Eyes: Negative.   Respiratory: Negative.   Cardiovascular: Negative.   Gastrointestinal: Negative.   Musculoskeletal: Positive for joint pain.  Skin: Negative.   Endo/Heme/Allergies: Bruises/bleeds easily.  Psychiatric/Behavioral: Negative.     Blood pressure 92/65, pulse 76, temperature 97.4 F (36.3 C), resp. rate 20, SpO2 93.00%. Physical Exam  Constitutional: She is oriented to person, place, and time. She appears well-developed and well-nourished.  HENT:  Head: Normocephalic and atraumatic.  Eyes: Conjunctivae are normal. Pupils are equal, round, and reactive to light.  Neck: Normal range of motion. Neck supple.  Cardiovascular: Normal rate.   Respiratory: Effort normal and breath sounds normal.  GI: Soft. Bowel sounds are normal.  Musculoskeletal: She exhibits tenderness.  Neurological: She is alert and oriented to person, place, and time.  Skin: Skin is warm.  Psychiatric: She has a normal mood and affect.     Assessment/Plan Impingement and labral tear L. Shoulder. Plan Scope and debridement labrum and SAD.  UNDERWOOD III,DOOLEY L 03/02/2012, 12:07 PM

## 2012-03-02 NOTE — Anesthesia Postprocedure Evaluation (Signed)
  Anesthesia Post-op Note  Patient: [name]  Procedure(s) Performed: Procedure(s) (LRB): SHOULDER ARTHROSCOPY WITH SUBACROMIAL DECOMPRESSION (Left)  Patient Location: PACU  Anesthesia Type: General  Level of Consciousness: awake and alert   Airway and Oxygen Therapy: Patient Spontanous Breathing  Post-op Pain: mild  Post-op Assessment: Post-op Vital signs reviewed, Patient's Cardiovascular Status Stable, Respiratory Function Stable, Patent Airway and No signs of Nausea or vomiting  Post-op Vital Signs: stable  Complications: No apparent anesthesia complications

## 2012-03-02 NOTE — Consult Note (Signed)
I have examined the patient and agree with HandP

## 2012-03-02 NOTE — Anesthesia Preprocedure Evaluation (Addendum)
Anesthesia Evaluation  Patient identified by MRN, date of birth, ID band Patient awake    Reviewed: Allergy & Precautions, H&P , NPO status , Patient's Chart, lab work & pertinent test results  Airway Mallampati: II TM Distance: >3 FB Neck ROM: Full    Dental No notable dental hx.    Pulmonary neg pulmonary ROS, asthma , COPDCurrent Smoker,  breath sounds clear to auscultation  Pulmonary exam normal       Cardiovascular negative cardio ROS  Rhythm:Regular Rate:Normal     Neuro/Psych  Headaches, PSYCHIATRIC DISORDERS Anxiety Depression  Neuromuscular disease negative neurological ROS  negative psych ROS   GI/Hepatic negative GI ROS, Neg liver ROS, GERD-  ,  Endo/Other  negative endocrine ROSHypothyroidism   Renal/GU negative Renal ROS  negative genitourinary   Musculoskeletal negative musculoskeletal ROS (+) Fibromyalgia -  Abdominal   Peds negative pediatric ROS (+)  Hematology negative hematology ROS (+)   Anesthesia Other Findings   Reproductive/Obstetrics negative OB ROS                           Anesthesia Physical Anesthesia Plan  ASA: III  Anesthesia Plan: General   Post-op Pain Management:    Induction: Intravenous  Airway Management Planned: Oral ETT  Additional Equipment:   Intra-op Plan:   Post-operative Plan: Extubation in OR  Informed Consent: I have reviewed the patients History and Physical, chart, labs and discussed the procedure including the risks, benefits and alternatives for the proposed anesthesia with the patient or authorized representative who has indicated his/her understanding and acceptance.   Dental advisory given  Plan Discussed with: CRNA  Anesthesia Plan Comments: (Not a candidate for brachial plexus block d/t fragile pulmonary status. Risk of post-op ventialtion)       Anesthesia Quick Evaluation

## 2012-03-02 NOTE — Transfer of Care (Signed)
Immediate Anesthesia Transfer of Care Note  Patient: [name]  Procedure(s) Performed: Procedure(s) (LRB): SHOULDER ARTHROSCOPY WITH SUBACROMIAL DECOMPRESSION (Left)  Patient Location: PACU  Anesthesia Type: General  Level of Consciousness: awake, alert , oriented, patient cooperative and responds to stimulation  Airway & Oxygen Therapy: Patient Spontanous Breathing and Patient connected to face mask oxygen  Post-op Assessment: Report given to PACU RN and Post -op Vital signs reviewed and stable  Post vital signs: stable  Complications: No apparent anesthesia complications

## 2012-03-03 MED ORDER — OXYCODONE-ACETAMINOPHEN 7.5-325 MG PO TABS: 1.0000 | ORAL_TABLET | ORAL | Status: AC | PRN

## 2012-03-03 NOTE — Discharge Summary (Signed)
NAMEROSELIA, SNIPE NO.:  [id number]  MEDICAL RECORD NO.:  [id number]  LOCATION:  1524                         FACILITY:  [facility]  PHYSICIAN:  Marlowe Kays, M.D.  DATE OF BIRTH:  [birth year]  DATE OF ADMISSION:  03/02/2012 DATE OF DISCHARGE:  03/03/2012                              DISCHARGE SUMMARY   ADMISSION DIAGNOSES: 1. 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 2. Labral tear, left shoulder.  DISCHARGE DIAGNOSES: 1. 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 2. Labral tear, left shoulder.  OPERATION:  Left shoulder arthroscopy with debridement of labrum and subacromial decompression on March 02, 2012.  SUMMARY:  This 61 year old female has had chronic progressive pain in her left shoulder with an MRI demonstrating the admission diagnoses. Because of this she was here for the above-mentioned surgery.  Her preoperative studies were all unremarkable.  The surgery went uneventfully.  Today she is comfortable and discharged on all her admission medications as well as on Percocet 7.5 mg.  Plan is to have her return to my office on April 15.  Instructions were given about keeping the dressing dry and using the sling for comfort.  CONDITION ON DISCHARGE:  Stable and improved.          ______________________________ Marlowe Kays, M.D.     JA/MEDQ  D:  03/03/2012  T:  03/03/2012  Job:  [id number]

## 2012-03-03 NOTE — Progress Notes (Signed)
Pt seen for initial OT shoulder evaluation. See shadow chart for complete details.  All sling and ADL education complete.  Pt safe to d/c home and follow up with therapy as ortho MD orders.   Tory Emerald, [place] [phone]

## 2012-03-03 NOTE — Op Note (Signed)
[name], [name] NO.:  [id number]  MEDICAL RECORD NO.:  [id number]  LOCATION:  1524                         FACILITY:  [facility]  PHYSICIAN:  Marlowe Kays, M.D.  DATE OF BIRTH:  Apr 06, 1951  DATE OF PROCEDURE:  03/02/2012 DATE OF DISCHARGE:                              OPERATIVE REPORT   PREOPERATIVE DIAGNOSES: 1. Labral tear. 2. 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  POSTOPERATIVE DIAGNOSES: 1. Labral tear. 2. 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  OPERATION: 1. Left shoulder arthroscopy with debridement of labrum. 2. Arthroscopic subacromial decompression.  SURGEON:  Marlowe Kays, M.D.  ASSISTANTDruscilla Brownie. Idolina Primer, P.A.C.  ANESTHESIA:  General.  PATHOLOGY AND JUSTIFICATION FOR PROCEDURE:  Painful left shoulder with MRI demonstrating the above mentioned preoperative abnormalities.  At surgery, she was found to have significant wear in the glenohumeral joint, with breakdown of the articular cartilage of the glenoid and several areas of complete loss of articular cartilage of the humeral head.  All this was pictured.  In the subacromial space, she had a very thick bursa.  She did not have an interscalene block anesthesia. Anesthesiologist was concerned about diaphragmatic dysfunction with COPD.  Mr. Angie Fava assistance was required for holding of the arm, manipulating the arm, and managing equipment during the case.  PROCEDURE IN DETAIL:  Prophylactic antibiotics, satisfied general anesthesia, beachchair position on the sliding frame.  The shoulder was prepped with DuraPrep, draped in sterile field.  Time-out performed. Anatomy of the shoulder joint was marked out and I infiltrated the anticipated portal up posterior and lateral portals in the subacromial space.  Through posterior soft spot portal, I atraumatically entered the glenohumeral joint.  With the findings described above were noted  with the labral tear as depicted on the MRI.  I advanced the scope between the biceps tendon and subscapularis, and using a switching stick, placed a metal cannula over it followed by 4.2 shaver into the joint.  I then debrided down the labrum with final pictures being taken.  I then directed the scope to the subacromial space through a lateral portal, introduced a 4.2 shaver.  Spent good amount of time trying to remove with very thick tenacious subacromial bursa.  I then brought in the 90- degree ArthroCare vaporizer and began removing soft tissue from the undersurface of the acromion back to the [facility] joint.  There was no evidence of impingement at the [facility] joint on the rotator cuff.  I followed this with a 4 mm oval bur, gradually burring down the undersurface of the acromion.  I went back and forth between the bur and the vaporizer until we had a wide decompression.  Lastly, I used a 4.2 shaver, smoothed down the bursal surface of the rotator cuff which was abraded. There was no evidence for any major tear, however.  When we completed decompression, all fluid possible was removed from the subacromial space.  I then injected all the portals and subacromial space with 0.5% Marcaine with adrenaline.  I closed the portals with 4-0 nylon. Betadine, Adaptic, dry sterile dressing, and shoulder immobilizer were applied.  She tolerated the procedure well, was taken to the recovery room in  satisfied condition with no known complications.          ______________________________ Marlowe Kays, M.D.     JA/MEDQ  D:  03/02/2012  T:  03/03/2012  Job:  [id number]

## 2012-03-03 NOTE — Progress Notes (Signed)
Pt refused to wear her Pas hose stating that she is " ambulatory and doesen't need them".

## 2012-05-30 DIAGNOSIS — K130 Diseases of lips: Secondary | ICD-10-CM | POA: Insufficient documentation

## 2012-07-20 ENCOUNTER — Other Ambulatory Visit: Payer: Self-pay | Admitting: Orthopedic Surgery

## 2012-07-21 ENCOUNTER — Encounter (HOSPITAL_BASED_OUTPATIENT_CLINIC_OR_DEPARTMENT_OTHER): Payer: Self-pay | Admitting: *Deleted

## 2012-07-26 ENCOUNTER — Encounter (HOSPITAL_COMMUNITY): Payer: Self-pay | Admitting: *Deleted

## 2012-07-26 ENCOUNTER — Encounter (HOSPITAL_COMMUNITY): Payer: Self-pay | Admitting: Pharmacy Technician

## 2012-07-26 NOTE — Pre-Procedure Instructions (Signed)
Faxed 07-24-2012 chest xray results to dr Simonne Come, fax confirmation received and placed on pt chart. Left wendy cayton message that abnormal chest 07-24-2012 faxed to dr Simonne Come

## 2012-07-26 NOTE — Pre-Procedure Instructions (Addendum)
Pt to call for chest xray to be faxed from [facility] done on 07-24-2012, pt states dr Simonne Come aware of cold and that pt on doxycycline. ekg 02-29-2012 epic

## 2012-07-26 NOTE — Pre-Procedure Instructions (Signed)
Fax received and placed on pt chart dr Simonne Come aware chest 07-24-2012 chest xray results

## 2012-07-28 ENCOUNTER — Encounter (HOSPITAL_BASED_OUTPATIENT_CLINIC_OR_DEPARTMENT_OTHER): Admission: RE | Payer: Self-pay | Source: Ambulatory Visit

## 2012-07-28 ENCOUNTER — Ambulatory Visit (HOSPITAL_BASED_OUTPATIENT_CLINIC_OR_DEPARTMENT_OTHER): Admission: RE | Admit: 2012-07-28 | Payer: 59 | Source: Ambulatory Visit | Admitting: Orthopedic Surgery

## 2012-07-28 HISTORY — DX: Unspecified osteoarthritis, unspecified site: M19.90

## 2012-07-28 HISTORY — DX: Personal history of other venous thrombosis and embolism: Z86.718

## 2012-07-28 SURGERY — CARPOMETACARPEL (CMC) FUSION OF THUMB WITH AUTOGRAFT FROM RADIUS
Anesthesia: General | Laterality: Left

## 2012-07-29 ENCOUNTER — Encounter (HOSPITAL_COMMUNITY)
Admission: RE | Disposition: A | Discharge status: Home / Self Care | Payer: Self-pay | Source: Ambulatory Visit | Attending: Orthopedic Surgery

## 2012-07-29 ENCOUNTER — Ambulatory Visit (HOSPITAL_COMMUNITY): Payer: 59

## 2012-07-29 ENCOUNTER — Encounter (HOSPITAL_COMMUNITY): Payer: Self-pay | Admitting: Anesthesiology

## 2012-07-29 ENCOUNTER — Encounter (HOSPITAL_COMMUNITY): Payer: Self-pay | Admitting: *Deleted

## 2012-07-29 ENCOUNTER — Ambulatory Visit (HOSPITAL_COMMUNITY): Payer: 59 | Admitting: Anesthesiology

## 2012-07-29 ENCOUNTER — Encounter (HOSPITAL_COMMUNITY): Payer: Self-pay | Admitting: Orthopedic Surgery

## 2012-07-29 ENCOUNTER — Observation Stay (HOSPITAL_COMMUNITY)
Admission: RE | Admit: 2012-07-29 | Discharge: 2012-07-31 | Disposition: A | Discharge status: Home / Self Care | Payer: 59 | Source: Ambulatory Visit | Attending: Orthopedic Surgery | Admitting: Orthopedic Surgery

## 2012-07-29 DIAGNOSIS — K219 Gastro-esophageal reflux disease without esophagitis: Secondary | ICD-10-CM | POA: Insufficient documentation

## 2012-07-29 DIAGNOSIS — E785 Hyperlipidemia, unspecified: Secondary | ICD-10-CM | POA: Insufficient documentation

## 2012-07-29 DIAGNOSIS — J438 Other emphysema: Secondary | ICD-10-CM | POA: Insufficient documentation

## 2012-07-29 DIAGNOSIS — Z96659 Presence of unspecified artificial knee joint: Secondary | ICD-10-CM | POA: Insufficient documentation

## 2012-07-29 DIAGNOSIS — Z79899 Other long term (current) drug therapy: Secondary | ICD-10-CM | POA: Insufficient documentation

## 2012-07-29 DIAGNOSIS — Z86718 Personal history of other venous thrombosis and embolism: Secondary | ICD-10-CM | POA: Insufficient documentation

## 2012-07-29 DIAGNOSIS — IMO0001 Reserved for inherently not codable concepts without codable children: Secondary | ICD-10-CM | POA: Insufficient documentation

## 2012-07-29 DIAGNOSIS — E039 Hypothyroidism, unspecified: Secondary | ICD-10-CM | POA: Insufficient documentation

## 2012-07-29 DIAGNOSIS — M19049 Primary osteoarthritis, unspecified hand: Principal | ICD-10-CM | POA: Insufficient documentation

## 2012-07-29 HISTORY — PX: FINGER ARTHROPLASTY: SHX5017

## 2012-07-29 HISTORY — DX: Dependence on supplemental oxygen: Z99.81

## 2012-07-29 HISTORY — DX: Reserved for inherently not codable concepts without codable children: IMO0001

## 2012-07-29 LAB — BASIC METABOLIC PANEL
BUN: 10 mg/dL (ref 6–23)
Calcium: 8.7 mg/dL (ref 8.4–10.5)
GFR calc non Af Amer: >90 mL/min (ref >90)
Glucose, Bld: 86 mg/dL (ref 70–99)
Sodium: 139 mEq/L (ref 135–145)

## 2012-07-29 LAB — CBC
Hemoglobin: 13.9 g/dL (ref 12.0–15.0)
MCH: 30.2 pg (ref 26.0–34.0)
MCHC: 32.7 g/dL (ref 30.0–36.0)

## 2012-07-29 LAB — SURGICAL PCR SCREEN: MRSA, PCR: POSITIVE — AB

## 2012-07-29 SURGERY — ARTHROPLASTY, FINGER
Anesthesia: General | Site: Hand | Laterality: Left | Wound class: Clean

## 2012-07-29 MED ORDER — SALINE SPRAY 0.65 % NA SOLN
1.0000 | Freq: Every day | NASAL | Status: DC | PRN
  Filled 2012-07-29: qty 44

## 2012-07-29 MED ORDER — METOCLOPRAMIDE HCL 10 MG PO TABS: 5.0000 mg | ORAL_TABLET | Freq: Three times a day (TID) | ORAL | Status: DC | PRN

## 2012-07-29 MED ORDER — MIDAZOLAM HCL 2 MG/2ML IJ SOLN
INTRAMUSCULAR | Status: AC
  Filled 2012-07-29: qty 2

## 2012-07-29 MED ORDER — ONDANSETRON HCL 4 MG PO TABS: 4.0000 mg | ORAL_TABLET | Freq: Four times a day (QID) | ORAL | Status: DC | PRN

## 2012-07-29 MED ORDER — ALBUTEROL SULFATE (5 MG/ML) 0.5% IN NEBU
2.5000 mg | INHALATION_SOLUTION | Freq: Once | RESPIRATORY_TRACT | Status: AC
  Administered 2012-07-29: 2.5 mg via RESPIRATORY_TRACT

## 2012-07-29 MED ORDER — VITAMIN C 500 MG PO TABS
1000.0000 mg | ORAL_TABLET | Freq: Two times a day (BID) | ORAL | Status: DC
  Administered 2012-07-30 – 2012-07-31 (×3): 1000 mg via ORAL
  Filled 2012-07-29 (×5): qty 2

## 2012-07-29 MED ORDER — CEFAZOLIN SODIUM-DEXTROSE 2-3 GM-% IV SOLR
INTRAVENOUS | Status: AC
  Filled 2012-07-29: qty 50

## 2012-07-29 MED ORDER — VANCOMYCIN HCL IN DEXTROSE 1-5 GM/200ML-% IV SOLN
INTRAVENOUS | Status: AC
  Filled 2012-07-29: qty 200

## 2012-07-29 MED ORDER — AZITHROMYCIN 250 MG PO TABS
250.0000 mg | ORAL_TABLET | Freq: Every day | ORAL | Status: AC
  Administered 2012-07-29 – 2012-07-31 (×3): 250 mg via ORAL
  Filled 2012-07-29 (×3): qty 1

## 2012-07-29 MED ORDER — VANCOMYCIN HCL IN DEXTROSE 1-5 GM/200ML-% IV SOLN
1000.0000 mg | Freq: Two times a day (BID) | INTRAVENOUS | Status: AC
  Administered 2012-07-30: 1000 mg via INTRAVENOUS
  Filled 2012-07-29: qty 200

## 2012-07-29 MED ORDER — VALACYCLOVIR HCL 500 MG PO TABS
1000.0000 mg | ORAL_TABLET | Freq: Two times a day (BID) | ORAL | Status: DC | PRN
  Filled 2012-07-29: qty 2

## 2012-07-29 MED ORDER — BUPROPION HCL ER (SR) 150 MG PO TB12
150.0000 mg | ORAL_TABLET | Freq: Two times a day (BID) | ORAL | Status: DC
Start: 2012-07-30 — End: 2012-07-31
  Administered 2012-07-30 – 2012-07-31 (×3): 150 mg via ORAL
  Filled 2012-07-29 (×4): qty 1

## 2012-07-29 MED ORDER — PROPOFOL 10 MG/ML IV BOLUS
INTRAVENOUS | Status: DC | PRN
  Administered 2012-07-29: 200 mg via INTRAVENOUS

## 2012-07-29 MED ORDER — PHENYLEPHRINE HCL 10 MG/ML IJ SOLN
INTRAMUSCULAR | Status: DC | PRN
  Administered 2012-07-29: 80 ug via INTRAVENOUS

## 2012-07-29 MED ORDER — HYDROMORPHONE HCL PF 1 MG/ML IJ SOLN
INTRAMUSCULAR | Status: AC
  Filled 2012-07-29: qty 1

## 2012-07-29 MED ORDER — GABAPENTIN 300 MG PO CAPS
300.0000 mg | ORAL_CAPSULE | Freq: Two times a day (BID) | ORAL | Status: DC
  Administered 2012-07-29 – 2012-07-31 (×4): 300 mg via ORAL
  Filled 2012-07-29 (×5): qty 1

## 2012-07-29 MED ORDER — LACTATED RINGERS IV SOLN
INTRAVENOUS | Status: DC
  Administered 2012-07-29: 1000 mL via INTRAVENOUS
  Administered 2012-07-29: 14:00:00 via INTRAVENOUS

## 2012-07-29 MED ORDER — BUPIVACAINE HCL 0.5 % IJ SOLN
INTRAMUSCULAR | Status: DC | PRN
  Administered 2012-07-29: 3 mL

## 2012-07-29 MED ORDER — ESCITALOPRAM OXALATE 20 MG PO TABS
20.0000 mg | ORAL_TABLET | Freq: Every morning | ORAL | Status: DC
  Administered 2012-07-30 – 2012-07-31 (×2): 20 mg via ORAL
  Filled 2012-07-29 (×2): qty 1

## 2012-07-29 MED ORDER — CEFAZOLIN SODIUM-DEXTROSE 2-3 GM-% IV SOLR
2.0000 g | INTRAVENOUS | Status: AC
  Administered 2012-07-29: 2 g via INTRAVENOUS

## 2012-07-29 MED ORDER — ESTROGENS CONJUGATED 0.3 MG PO TABS
0.3000 mg | ORAL_TABLET | Freq: Every day | ORAL | Status: DC
  Administered 2012-07-29 – 2012-07-30 (×2): 0.3 mg via ORAL
  Filled 2012-07-29 (×3): qty 1

## 2012-07-29 MED ORDER — LACTATED RINGERS IV SOLN: INTRAVENOUS | Status: DC

## 2012-07-29 MED ORDER — QUETIAPINE FUMARATE 50 MG PO TABS
50.0000 mg | ORAL_TABLET | Freq: Every day | ORAL | Status: DC
  Administered 2012-07-29 – 2012-07-30 (×2): 50 mg via ORAL
  Filled 2012-07-29 (×3): qty 1

## 2012-07-29 MED ORDER — METOCLOPRAMIDE HCL 5 MG/ML IJ SOLN: 5.0000 mg | Freq: Three times a day (TID) | INTRAMUSCULAR | Status: DC | PRN

## 2012-07-29 MED ORDER — METHOCARBAMOL 750 MG PO TABS
750.0000 mg | ORAL_TABLET | Freq: Four times a day (QID) | ORAL | Status: DC | PRN
Start: 2012-07-29 — End: 2012-07-31
  Administered 2012-07-29 – 2012-07-31 (×6): 750 mg via ORAL
  Filled 2012-07-29: qty 1
  Filled 2012-07-29: qty 1.5
  Filled 2012-07-29 (×4): qty 1

## 2012-07-29 MED ORDER — NALOXONE HCL 0.4 MG/ML IJ SOLN: 0.4000 mg | INTRAMUSCULAR | Status: DC | PRN

## 2012-07-29 MED ORDER — 0.9 % SODIUM CHLORIDE (POUR BTL) OPTIME
TOPICAL | Status: DC | PRN
  Administered 2012-07-29: 1000 mL

## 2012-07-29 MED ORDER — LORATADINE 10 MG PO TABS
10.0000 mg | ORAL_TABLET | Freq: Every day | ORAL | Status: DC
  Administered 2012-07-30 – 2012-07-31 (×2): 10 mg via ORAL
  Filled 2012-07-29 (×2): qty 1

## 2012-07-29 MED ORDER — GLYCOPYRROLATE 0.2 MG/ML IJ SOLN
INTRAMUSCULAR | Status: DC | PRN
  Administered 2012-07-29: 0.1 mg via INTRAVENOUS

## 2012-07-29 MED ORDER — DOXYCYCLINE HYCLATE 100 MG PO TABS: 100.0000 mg | ORAL_TABLET | Freq: Two times a day (BID) | ORAL | Status: DC

## 2012-07-29 MED ORDER — SUMATRIPTAN SUCCINATE 50 MG PO TABS
50.0000 mg | ORAL_TABLET | ORAL | Status: DC | PRN
  Administered 2012-07-29 – 2012-07-30 (×3): 50 mg via ORAL
  Filled 2012-07-29 (×3): qty 1

## 2012-07-29 MED ORDER — CLONAZEPAM 0.5 MG PO TABS
0.2500 mg | ORAL_TABLET | Freq: Three times a day (TID) | ORAL | Status: DC | PRN
  Administered 2012-07-29 – 2012-07-31 (×5): 0.5 mg via ORAL
  Filled 2012-07-29 (×5): qty 1

## 2012-07-29 MED ORDER — MORPHINE SULFATE (PF) 1 MG/ML IV SOLN
INTRAVENOUS | Status: AC
  Filled 2012-07-29: qty 25

## 2012-07-29 MED ORDER — LIDOCAINE HCL (CARDIAC) 10 MG/ML IV SOLN
INTRAVENOUS | Status: DC | PRN
  Administered 2012-07-29: 20 mg via INTRAVENOUS

## 2012-07-29 MED ORDER — ONDANSETRON HCL 4 MG/2ML IJ SOLN: 4.0000 mg | Freq: Four times a day (QID) | INTRAMUSCULAR | Status: DC | PRN

## 2012-07-29 MED ORDER — ACETAMINOPHEN 10 MG/ML IV SOLN
INTRAVENOUS | Status: AC
  Filled 2012-07-29: qty 100

## 2012-07-29 MED ORDER — B COMPLEX-C PO TABS
1.0000 | ORAL_TABLET | Freq: Every day | ORAL | Status: DC
  Administered 2012-07-29 – 2012-07-31 (×3): 1 via ORAL
  Filled 2012-07-29 (×3): qty 1

## 2012-07-29 MED ORDER — ATORVASTATIN CALCIUM 10 MG PO TABS
10.0000 mg | ORAL_TABLET | Freq: Every day | ORAL | Status: DC
  Administered 2012-07-30 (×2): 10 mg via ORAL
  Filled 2012-07-29 (×3): qty 1

## 2012-07-29 MED ORDER — EPHEDRINE SULFATE 50 MG/ML IJ SOLN
INTRAMUSCULAR | Status: DC | PRN
  Administered 2012-07-29: 5 mg via INTRAVENOUS

## 2012-07-29 MED ORDER — NICOTINE 21 MG/24HR TD PT24
21.0000 mg | MEDICATED_PATCH | Freq: Every day | TRANSDERMAL | Status: DC
  Filled 2012-07-29 (×3): qty 1

## 2012-07-29 MED ORDER — MIDAZOLAM HCL 5 MG/5ML IJ SOLN
INTRAMUSCULAR | Status: DC | PRN
  Administered 2012-07-29: 2 mg via INTRAVENOUS
  Administered 2012-07-29: 1 mg via INTRAVENOUS
  Administered 2012-07-29: 2 mg via INTRAVENOUS

## 2012-07-29 MED ORDER — MORPHINE SULFATE 15 MG PO TABS
15.0000 mg | ORAL_TABLET | ORAL | Status: DC | PRN
  Administered 2012-07-29 – 2012-07-31 (×8): 15 mg via ORAL
  Filled 2012-07-29 (×8): qty 1

## 2012-07-29 MED ORDER — ASPIRIN 325 MG PO TABS
325.0000 mg | ORAL_TABLET | Freq: Every day | ORAL | Status: DC
  Administered 2012-07-29 – 2012-07-31 (×3): 325 mg via ORAL
  Filled 2012-07-29 (×3): qty 1

## 2012-07-29 MED ORDER — BUPIVACAINE HCL (PF) 0.5 % IJ SOLN
INTRAMUSCULAR | Status: AC
  Filled 2012-07-29: qty 30

## 2012-07-29 MED ORDER — AZITHROMYCIN 1 G PO PACK: 1.0000 | PACK | Freq: Once | ORAL | Status: DC

## 2012-07-29 MED ORDER — DIPHENHYDRAMINE HCL 50 MG/ML IJ SOLN: 12.5000 mg | Freq: Four times a day (QID) | INTRAMUSCULAR | Status: DC | PRN

## 2012-07-29 MED ORDER — PANTOPRAZOLE SODIUM 40 MG PO TBEC
40.0000 mg | DELAYED_RELEASE_TABLET | Freq: Every day | ORAL | Status: DC
  Administered 2012-07-30 – 2012-07-31 (×2): 40 mg via ORAL
  Filled 2012-07-29 (×2): qty 1

## 2012-07-29 MED ORDER — LEVOTHYROXINE SODIUM 25 MCG PO TABS
25.0000 ug | ORAL_TABLET | Freq: Every day | ORAL | Status: DC
  Administered 2012-07-30 – 2012-07-31 (×2): 25 ug via ORAL
  Filled 2012-07-29 (×3): qty 1

## 2012-07-29 MED ORDER — FENTANYL 75 MCG/HR TD PT72
75.0000 ug | MEDICATED_PATCH | TRANSDERMAL | Status: DC
  Administered 2012-07-31: 75 ug via TRANSDERMAL
  Filled 2012-07-29: qty 1

## 2012-07-29 MED ORDER — SODIUM CHLORIDE 0.9 % IJ SOLN: 9.0000 mL | INTRAMUSCULAR | Status: DC | PRN

## 2012-07-29 MED ORDER — ONDANSETRON HCL 4 MG/2ML IJ SOLN
INTRAMUSCULAR | Status: DC | PRN
  Administered 2012-07-29: 4 mg via INTRAVENOUS

## 2012-07-29 MED ORDER — POVIDONE-IODINE 7.5 % EX SOLN: Freq: Once | CUTANEOUS | Status: DC

## 2012-07-29 MED ORDER — DIPHENHYDRAMINE HCL 12.5 MG/5ML PO ELIX: 12.5000 mg | ORAL_SOLUTION | Freq: Four times a day (QID) | ORAL | Status: DC | PRN

## 2012-07-29 MED ORDER — MUPIROCIN 2 % EX OINT
TOPICAL_OINTMENT | Freq: Two times a day (BID) | CUTANEOUS | Status: DC
  Administered 2012-07-29: 14:00:00 via NASAL
  Filled 2012-07-29: qty 22

## 2012-07-29 MED ORDER — ALBUTEROL SULFATE (5 MG/ML) 0.5% IN NEBU
INHALATION_SOLUTION | RESPIRATORY_TRACT | Status: AC
  Filled 2012-07-29: qty 0.5

## 2012-07-29 MED ORDER — ALBUTEROL SULFATE HFA 108 (90 BASE) MCG/ACT IN AERS
2.0000 | INHALATION_SPRAY | Freq: Four times a day (QID) | RESPIRATORY_TRACT | Status: DC | PRN
  Filled 2012-07-29: qty 6.7

## 2012-07-29 MED ORDER — VANCOMYCIN HCL 1000 MG IV SOLR
1000.0000 mg | INTRAVENOUS | Status: DC | PRN
  Administered 2012-07-29: 1000 mg via INTRAVENOUS

## 2012-07-29 MED ORDER — FENTANYL CITRATE 0.05 MG/ML IJ SOLN
INTRAMUSCULAR | Status: DC | PRN
  Administered 2012-07-29 (×6): 50 ug via INTRAVENOUS

## 2012-07-29 MED ORDER — ALBUTEROL SULFATE (5 MG/ML) 0.5% IN NEBU
2.5000 mg | INHALATION_SOLUTION | Freq: Once | RESPIRATORY_TRACT | Status: AC
  Administered 2012-07-29: 2.5 mg via RESPIRATORY_TRACT
  Filled 2012-07-29: qty 0.5

## 2012-07-29 MED ORDER — PROMETHAZINE HCL 25 MG/ML IJ SOLN: 6.2500 mg | INTRAMUSCULAR | Status: DC | PRN

## 2012-07-29 MED ORDER — BUPROPION HCL ER (SR) 150 MG PO TB12: 300.0000 mg | ORAL_TABLET | Freq: Every day | ORAL | Status: DC

## 2012-07-29 MED ORDER — MORPHINE SULFATE (PF) 1 MG/ML IV SOLN
INTRAVENOUS | Status: DC
  Administered 2012-07-29: 1 mg via INTRAVENOUS
  Administered 2012-07-29: 23.25 mg via INTRAVENOUS
  Administered 2012-07-30: 12:00:00 via INTRAVENOUS
  Administered 2012-07-30: 9 mg via INTRAVENOUS
  Administered 2012-07-30 (×2): via INTRAVENOUS
  Administered 2012-07-30: 18.53 mg via INTRAVENOUS
  Filled 2012-07-29 (×6): qty 25

## 2012-07-29 MED ORDER — HYDROMORPHONE HCL PF 1 MG/ML IJ SOLN
0.2500 mg | INTRAMUSCULAR | Status: DC | PRN
  Administered 2012-07-29 (×4): 0.5 mg via INTRAVENOUS

## 2012-07-29 MED ORDER — ROPINIROLE HCL 1 MG PO TABS
4.0000 mg | ORAL_TABLET | Freq: Every day | ORAL | Status: DC
  Administered 2012-07-29 – 2012-07-30 (×2): 4 mg via ORAL
  Filled 2012-07-29 (×3): qty 4

## 2012-07-29 SURGICAL SUPPLY — 45 items
BAG SPEC THK2 15X12 ZIP CLS (MISCELLANEOUS) ×1
BAG ZIPLOCK 12X15 (MISCELLANEOUS) ×2 IMPLANT
BANDAGE COBAN STERILE 2 (GAUZE/BANDAGES/DRESSINGS) ×2 IMPLANT
BANDAGE CONFORM 3  STR LF (GAUZE/BANDAGES/DRESSINGS) ×2 IMPLANT
BANDAGE ELASTIC 3 VELCRO ST LF (GAUZE/BANDAGES/DRESSINGS) ×1 IMPLANT
BANDAGE GAUZE ELAST BULKY 4 IN (GAUZE/BANDAGES/DRESSINGS) ×2 IMPLANT
BLADE OSC/SAGITTAL MD 5.5X18 (BLADE) ×1 IMPLANT
BLADE SURG SZ10 CARB STEEL (BLADE) ×2 IMPLANT
CLOTH BEACON ORANGE TIMEOUT ST (SAFETY) ×2 IMPLANT
CORDS BIPOLAR (ELECTRODE) ×2 IMPLANT
CUFF TOURN SGL QUICK 18 (TOURNIQUET CUFF) ×1 IMPLANT
DECANTER SPIKE VIAL GLASS SM (MISCELLANEOUS) ×1 IMPLANT
DRAPE SURG 17X11 SM STRL (DRAPES) ×2 IMPLANT
DRSG EMULSION OIL 3X3 NADH (GAUZE/BANDAGES/DRESSINGS) ×2 IMPLANT
DURAPREP 26ML APPLICATOR (WOUND CARE) ×2 IMPLANT
GLOVE BIO SURGEON STRL SZ7.5 (GLOVE) ×2 IMPLANT
GLOVE BIO SURGEON STRL SZ8 (GLOVE) ×4 IMPLANT
GLOVE ECLIPSE 8.0 STRL XLNG CF (GLOVE) ×2 IMPLANT
GLOVE INDICATOR 8.0 STRL GRN (GLOVE) ×4 IMPLANT
GOWN STRL REIN XL XLG (GOWN DISPOSABLE) ×2 IMPLANT
K-WIRE CAPS STERILE WHITE .045 (WIRE) ×1 IMPLANT
KIT BASIN OR (CUSTOM PROCEDURE TRAY) ×2 IMPLANT
KWIRE 4.0 X .045IN (WIRE) ×1 IMPLANT
MANIFOLD NEPTUNE II (INSTRUMENTS) ×2 IMPLANT
NEEDLE HYPO 22GX1.5 SAFETY (NEEDLE) ×2 IMPLANT
NS IRRIG 1000ML POUR BTL (IV SOLUTION) ×2 IMPLANT
PACK LOWER EXTREMITY WL (CUSTOM PROCEDURE TRAY) ×2 IMPLANT
PAD CAST 3X4 CTTN HI CHSV (CAST SUPPLIES) ×1 IMPLANT
PADDING CAST ABS 3INX4YD NS (CAST SUPPLIES) ×1
PADDING CAST ABS COTTON 3X4 (CAST SUPPLIES) IMPLANT
PADDING CAST COTTON 3X4 STRL (CAST SUPPLIES) ×2
POSITIONER SURGICAL ARM (MISCELLANEOUS) ×2 IMPLANT
SPLINT PLASTER EXTRA FAST 3X15 (CAST SUPPLIES) ×20
SPLINT PLASTER GYPS XFAST 3X15 (CAST SUPPLIES) IMPLANT
SPONGE GAUZE 4X4 12PLY (GAUZE/BANDAGES/DRESSINGS) ×4 IMPLANT
SPONGE SURGIFOAM ABS GEL 100 (HEMOSTASIS) ×2 IMPLANT
SPONGE SURGIFOAM ABS GEL 12-7 (HEMOSTASIS) ×1 IMPLANT
SUT ETHILON 3 0 PS 1 (SUTURE) ×1 IMPLANT
SUT ETHILON 4 0 PS 2 18 (SUTURE) ×4 IMPLANT
SUT VIC AB 3-0 SH 27 (SUTURE) ×4
SUT VIC AB 3-0 SH 27X BRD (SUTURE) ×2 IMPLANT
SYR CONTROL 10ML LL (SYRINGE) ×2 IMPLANT
TOWEL OR 17X26 10 PK STRL BLUE (TOWEL DISPOSABLE) ×2 IMPLANT
WATER STERILE IRR 1500ML POUR (IV SOLUTION) ×1 IMPLANT
k-wire 4.0x0.045 ×1 IMPLANT

## 2012-07-29 NOTE — Anesthesia Postprocedure Evaluation (Signed)
Anesthesia Post Note  Patient: [name]  Procedure(s) Performed: Procedure(s) (LRB): FINGER ARTHROPLASTY (Left)  Anesthesia type: General  Patient location: PACU  Post pain: Pain level controlled  Post assessment: Post-op Vital signs reviewed  Last Vitals:  Filed Vitals:   07/29/12 1917  BP:   Pulse:   Temp:   Resp: 11    Post vital signs: Reviewed  Level of consciousness: sedated  Complications: No apparent anesthesia complications

## 2012-07-29 NOTE — H&P (Signed)
[name] is an 61 y.o. female.   Chief Complaint: painful lt thumb WUJ:[id number] c-mcjoint lt thumb--no significant relief with medication and injection  Past Medical History  Diagnosis Date  . Asthma   . Shortness of breath     oxygen at 2 liters at night  . Hypothyroidism   . GERD (gastroesophageal reflux disease)   . Headache     migraines  . Depression   . Seasonal allergies     sgtates clear nasal drainage x 3- days- states no fever  . Anxiety     states frequent  anxiety attacks  . COPD (chronic obstructive pulmonary disease)     LOV with PFT 1/13  Dr Cherie Ouch on chart-   pt states no changes since last saw pulmonary  . Hyperlipidemia   . History of DVT of lower extremity 1972    right leg  . Fibromyalgia     states has had trigger point injections shoulders-   . OA (osteoarthritis)   . Cold no drainage    since thursday 07-21-12, chest xray done 07-24-2012   . On home oxygen therapy     2 liters per minute per [place] as needed    Past Surgical History  Procedure Date  . Bartholin gland cyst excision 1977    PRIOR MARSIPULIZATION IN 1974  . Thyroid lobectomy 2009    PARTIAL LEFT LOWER LOBECTOMY  . Knee arthrotomy LAST ONE 2000    right x 3 prior to replacement  . Closed manipulation right knee replacement 09-10-2010  . Total knee arthroplasty 07-31-2010    OA  RIGHT KNEE  . Knee arthroscopy 10-26-2008    AND REMOVAL 2 SCREWS PROXIMAL TIBIA  OF RIGHT KNEE  . Bowel surgery for obstruction 1992  . Hemorrhoid surgery 1988  . Left shoulder arthroscopy 03-02-2012  . Diagnostic laparoscopy 1989    x2-3 with ovarian cystectomy  . Tonsillectomy 1959  . Abdominal hysterectomy 1990    bilateral salpingo-oophorectomy    History reviewed. No pertinent family history. Social History:  reports that she has been smoking Cigarettes.  She has a 86 pack-year smoking history. She has never used smokeless tobacco. She reports that she does not drink alcohol or  use illicit drugs.  Allergies:  Allergies  Allergen Reactions  . Codeine Nausea And Vomiting  . Doxycycline Nausea And Vomiting  . Penicillins Other (See Comments)    Family cannot take / update 03/02/12 - pt. does not remember the reaction.  . Tylenol (Acetaminophen) Other (See Comments)    Cannot take with Neurontin / update 03/02/12 - causes diarrhea  . Vistaril (Hydroxyzine Hcl) Other (See Comments)     Hallucinations    Medications Prior to Admission  Medication Sig Dispense Refill  . albuterol (2.5 MG/3ML) 0.083% NEBU 3 mL, albuterol (5 MG/ML) 0.5% NEBU 0.5 mL Inhale into the lungs as needed.      Marland Kitchen albuterol (PROVENTIL HFA;VENTOLIN HFA) 108 (90 BASE) MCG/ACT inhaler Inhale 2 puffs into the lungs every 6 (six) hours as needed. Wheezing      . atorvastatin (LIPITOR) 20 MG tablet Take 10 mg by mouth daily before breakfast. Tales 1/2 tablet      . azithromycin (ZITHROMAX) 1 G powder Take 1 packet by mouth once.      . B Complex-C (B-COMPLEX WITH VITAMIN C) tablet Take 1 tablet by mouth daily.      Marland Kitchen buPROPion (WELLBUTRIN SR) 150 MG 12 hr tablet Take 300 mg by mouth daily  before breakfast.      . butalbital-acetaminophen-caffeine (FIORICET, ESGIC) 50-325-40 MG per tablet Take 1 tablet by mouth 2 (two) times daily as needed. Headache      . clonazePAM (KLONOPIN) 0.5 MG tablet Take 0.25-0.5 mg by mouth 3 (three) times daily as needed. Takes 1/2 tablet in the morning and whole tablet the other times-Anxiety      . escitalopram (LEXAPRO) 20 MG tablet Take 20 mg by mouth every morning.      . estrogens, conjugated, (PREMARIN) 0.3 MG tablet Take 0.3 mg by mouth daily. PT STATES TAKES DAILY      . fentaNYL (DURAGESIC - DOSED MCG/HR) 75 MCG/HR Place 1 patch onto the skin every other day.       . fexofenadine (ALLEGRA) 180 MG tablet Take 180 mg by mouth daily.      . frovatriptan (FROVA) 2.5 MG tablet Take 2.5 mg by mouth as needed. If recurs, may repeat after 2 hours. Max of 3 tabs in 24 hours.  Headache      . gabapentin (NEURONTIN) 300 MG capsule Take 300 mg by mouth 2 (two) times daily.      Marland Kitchen levothyroxine (SYNTHROID, LEVOTHROID) 25 MCG tablet Take 25 mcg by mouth daily before breakfast.      . methocarbamol (ROBAXIN) 750 MG tablet Take 750 mg by mouth as needed. Pain      . morphine (MSIR) 15 MG tablet Take 15 mg by mouth every 4 (four) hours as needed. Pain      . QUEtiapine (SEROQUEL) 50 MG tablet Take 50 mg by mouth at bedtime.       . RABEprazole (ACIPHEX) 20 MG tablet Take 20 mg by mouth daily.      . valACYclovir (VALTREX) 1000 MG tablet Take 1,000 mg by mouth 2 (two) times daily as needed. Outbreak       . Ascorbic Acid (VITAMIN C) 1000 MG tablet Take 1,000 mg by mouth 2 (two) times daily.      Marland Kitchen aspirin 325 MG tablet Take 325 mg by mouth daily as needed. Pain      . Calcium Carbonate-Vitamin D (CALCIUM + D PO) Take 1 tablet by mouth 2 (two) times daily.      Marland Kitchen doxycycline (VIBRA-TABS) 100 MG tablet Take 100 mg by mouth 2 (two) times daily.      Marland Kitchen rOPINIRole (REQUIP) 2 MG tablet Take 4 mg by mouth at bedtime.      . sodium chloride (OCEAN) 0.65 % nasal spray Place 1 spray into the nose daily as needed. Allergies        Results for orders placed during the hospital encounter of 07/29/12 (from the past 48 hour(s))  CBC     Status: Normal   Collection Time   07/29/12 11:00 AM      Component Value Range Comment   WBC 7.5  4.0 - 10.5 K/uL    RBC 4.60  3.87 - 5.11 MIL/uL    Hemoglobin 13.9  12.0 - 15.0 g/dL    HCT 16.1  09.6 - 04.5 %    MCV 92.4  78.0 - 100.0 fL    MCH 30.2  26.0 - 34.0 pg    MCHC 32.7  30.0 - 36.0 g/dL    RDW 40.9  81.1 - 91.4 %    Platelets 181  150 - 400 K/uL   BASIC METABOLIC PANEL     Status: Normal   Collection Time   07/29/12 11:00 AM  Component Value Range Comment   Sodium 139  135 - 145 mEq/L    Potassium 3.7  3.5 - 5.1 mEq/L    Chloride 103  96 - 112 mEq/L    CO2 30  19 - 32 mEq/L    Glucose, Bld 86  70 - 99 mg/dL    BUN 10  6 - 23  mg/dL    Creatinine, Ser 9.60  0.50 - 1.10 mg/dL    Calcium 8.7  8.4 - 45.4 mg/dL    GFR calc non Af Amer >90  >90 mL/min    GFR calc Af Amer >90  >90 mL/min    Dg Chest 2 View  07/29/2012  *RADIOLOGY REPORT*  Clinical Data: Preop left thumb arthroplasty  CHEST - 2 VIEW  Comparison: 02/29/2012  Findings: Chronic interstitial markings/emphysematous changes. Left upper lobe scarring/fibrosis.  No pleural effusion or pneumothorax.  Cardiomediastinal silhouette is within normal limits.  Thoracolumbar scoliosis.  IMPRESSION: No evidence of acute cardiopulmonary disease.  Chronic interstitial markings/emphysematous changes.   Original Report Authenticated By: Charline Bills, M.D.     ROS  Blood pressure 111/66, pulse 64, temperature 97.5 F (36.4 C), resp. rate 20, height 5\' 3"  (1.6 m), weight 51.71 kg (114 lb), SpO2 94.00%. Physical Exam  Constitutional: She is oriented to person, place, and time. She appears well-developed and well-nourished.  HENT:  Head: Normocephalic and atraumatic.  Right Ear: External ear normal.  Left Ear: External ear normal.  Nose: Nose normal.  Mouth/Throat: Oropharynx is clear and moist.  Eyes: Conjunctivae and EOM are normal. Pupils are equal, round, and reactive to light.  Neck: Normal range of motion. Neck supple.  Cardiovascular: Normal rate, regular rhythm, normal heart sounds and intact distal pulses.   Respiratory: Effort normal and breath sounds normal.  GI: Soft. Bowel sounds are normal.  Musculoskeletal: Normal range of motion. She exhibits tenderness.       Tender c-mc joint lt thumb  Neurological: She is alert and oriented to person, place, and time. She has normal reflexes.  Skin: Skin is warm and dry.  Psychiatric: She has a normal mood and affect. Her behavior is normal. Judgment and thought content normal.     Assessment/Plan Painful osteoarthritis c-mc joint lt thumb Interpositional arthroplasty c-mc joint lt thumb  [name]  P 07/29/2012, 12:37 PM

## 2012-07-29 NOTE — Transfer of Care (Signed)
Immediate Anesthesia Transfer of Care Note  Patient: Geneveive Furness  Procedure(s) Performed: Procedure(s) (LRB) with comments: FINGER ARTHROPLASTY (Left) - Left Thumb, CMC Interpositional Arthroplasty Left Thumb,with Palmaris Longis Tendon Graft, IPSI Lateral     Patient Location: PACU  Anesthesia Type: General  Level of Consciousness: awake, alert , oriented, patient cooperative and responds to stimulation  Airway & Oxygen Therapy: Patient Spontanous Breathing and Patient connected to face mask oxygen  Post-op Assessment: Report given to PACU RN, Post -op Vital signs reviewed and stable and Patient moving all extremities X 4  Post vital signs: stable  Complications: No apparent anesthesia complications

## 2012-07-29 NOTE — Anesthesia Preprocedure Evaluation (Signed)
Anesthesia Evaluation  Patient identified by MRN, date of birth, ID band Patient awake    Reviewed: Allergy & Precautions, H&P , NPO status , Patient's Chart, lab work & pertinent test results  History of Anesthesia Complications Negative for: history of anesthetic complications  Airway Mallampati: II TM Distance: >3 FB Neck ROM: Full    Dental  (+) Edentulous Upper and Edentulous Lower   Pulmonary neg pulmonary ROS, shortness of breath and Long-Term Oxygen Therapy, asthma , COPDCurrent Smoker (86 pack years),  + rhonchi   + decreased breath sounds      Cardiovascular negative cardio ROS  Rhythm:Regular Rate:Normal     Neuro/Psych  Headaches, Anxiety Depression  Neuromuscular disease negative neurological ROS  negative psych ROS   GI/Hepatic Neg liver ROS, GERD-  Medicated,  Endo/Other  Hypothyroidism   Renal/GU negative Renal ROS  negative genitourinary   Musculoskeletal  (+) Fibromyalgia -, narcotic dependent  Abdominal   Peds negative pediatric ROS (+)  Hematology negative hematology ROS (+)   Anesthesia Other Findings   Reproductive/Obstetrics negative OB ROS                           Anesthesia Physical Anesthesia Plan  ASA: III  Anesthesia Plan: General   Post-op Pain Management:    Induction: Intravenous  Airway Management Planned: LMA  Additional Equipment:   Intra-op Plan:   Post-operative Plan: Extubation in OR  Informed Consent: I have reviewed the patients History and Physical, chart, labs and discussed the procedure including the risks, benefits and alternatives for the proposed anesthesia with the patient or authorized representative who has indicated his/her understanding and acceptance.   Dental advisory given  Plan Discussed with: CRNA  Anesthesia Plan Comments:         Anesthesia Quick Evaluation

## 2012-07-29 NOTE — Progress Notes (Signed)
07/29/12 Nursing 2000 Alphonsa Overall PA called reg patient's co of pain despite high dose pca morphine. Order received that we may give po morhine as ordered along with pca medication

## 2012-07-29 NOTE — Brief Op Note (Signed)
07/29/2012  2:55 PM  PATIENT:  [name]  61 y.o. female  PRE-OPERATIVE DIAGNOSIS:  left thumb arthritis c-mc joint  POST-OPERATIVE DIAGNOSIS:  left thumb arthritis c-mc joint  PROCEDURE:  Procedure(s) (LRB) with comments: FINGER ARTHROPLASTY (Left) - Left Thumb, CMC Interpositional Arthroplasty Left Thumb,with Palmaris Longis Tendon Graft, IPSI Lateral     SURGEON:  Surgeon(s) and Role:    * Drucilla Schmidt, MD - Primary  PHYSICIAN ASSISTANT:   ASSISTANTS:nurse  ANESTHESIA:   general  EBL:  Total I/O In: 1000 [I.V.:1000] Out: -   BLOOD ADMINISTERED:none  DRAINS: none   LOCAL MEDICATIONS USED:  MARCAINE     SPECIMEN:  No Specimen  DISPOSITION OF SPECIMEN:  N/A  COUNTS:  YES  TOURNIQUET:   Total Tourniquet Time Documented: Upper Arm (Left) - 84 minutes  DICTATION: .Other Dictation: Dictation Number [id number]  PLAN OF CARE: Admit for overnight observation  PATIENT DISPOSITION:  PACU - hemodynamically stable.   Delay start of Pharmacological VTE agent (>24hrs) due to surgical blood loss or risk of bleeding: yes

## 2012-07-30 MED ORDER — HYDROMORPHONE HCL 2 MG PO TABS: 2.0000 mg | ORAL_TABLET | ORAL | Status: DC | PRN

## 2012-07-30 MED ORDER — HYDROMORPHONE HCL 2 MG PO TABS
2.0000 mg | ORAL_TABLET | ORAL | Status: DC | PRN
  Administered 2012-07-30 – 2012-07-31 (×3): 4 mg via ORAL
  Filled 2012-07-30 (×3): qty 2

## 2012-07-30 MED ORDER — HYDROMORPHONE HCL 2 MG PO TABS
2.0000 mg | ORAL_TABLET | Freq: Once | ORAL | Status: AC
  Administered 2012-07-30: 2 mg via ORAL
  Filled 2012-07-30: qty 1

## 2012-07-30 MED ORDER — HYDROMORPHONE HCL 2 MG PO TABS
2.0000 mg | ORAL_TABLET | ORAL | Status: DC | PRN
  Administered 2012-07-30: 2 mg via ORAL
  Filled 2012-07-30: qty 1

## 2012-07-30 MED ORDER — HYDROMORPHONE HCL 2 MG PO TABS: 4.0000 mg | ORAL_TABLET | ORAL | Status: DC | PRN

## 2012-07-30 NOTE — Progress Notes (Signed)
07/29/12 Nursing 2000  Unable to chart in computer patient's use of pca morphine at this time. 10.5 mg morphine cleared on pump at this time

## 2012-07-30 NOTE — Op Note (Signed)
NAMETRINITI, GRUETZMACHER NO.:  [id number]  MEDICAL RECORD NO.:  [id number]  LOCATION:  1613                         FACILITY:  [facility]  PHYSICIAN:  Marlowe Kays, M.D.  DATE OF BIRTH:  December 15, 1950  DATE OF PROCEDURE:  07/29/2012 DATE OF DISCHARGE:                              OPERATIVE REPORT   PREOPERATIVE DIAGNOSIS:  Chronic painful carpometacarpal arthritis, left thumb.  POSTOPERATIVE DIAGNOSIS:  Chronic painful carpometacarpal arthritis, left thumb.  OPERATION:  Interpositional arthroplasty using ipsilateral palmaris longus tendon graft, carpometacarpal joint, left thumb.  SURGEON:  Marlowe Kays, M.D.  ASSISTANT:  Nurse.  ANESTHESIA:  General.  PATHOLOGY AND JUSTIFICATION FOR PROCEDURE:  She had failed to respond to all nonsurgical treatment.  PROCEDURE:  Prophylactic antibiotics, we originally started out with Ancef, but during the case found out that the cultures from her nose were positive for MRSA, so I ended up giving her vancomycin in addition. Satisfied general anesthesia, pneumatic tourniquet left upper extremity with the arm Esmarched out nonsterilely and tourniquet inflated to 250 mmHg.  The left upper extremity was then prepped with DuraPrep from just distal to the elbow to fingertips and draped in sterile field.  Time-out performed.  I first harvested the palmaris longus graft with multiple vertical incisions and then protected it with saline sponge and made a curved incision along the radial side of the carpometacarpal joint, protecting the dorsal sensory nerve, and working my way down between the thenar muscles and the dorsal structures at the natural interval there identifying the carpometacarpal joint.  I carefully isolated the base of the first metacarpal with a combination of periosteal elevator and knife dissection until it was demarcated to 360 degrees.  I then placed some baby Hohmann retractors beneath it and used micro saw to  amputate the 1st metacarpal at the metaphyseal flare.  Small residual fragments of bone were then removed.  I then went to the greater multangular and dissected distally down to the distal flare and after demarcating from the surrounding tissues I made my 1st initial cut using a micro saw, which took care of the portion of bone to the radial side and then on the curve portion distal on the ulnar, I made several other small cuts after isolating it from the surrounding tissue, removing the remainder of the articular surface.  I then placed a 0.045 smooth K-wire retrograde 1st taking it through the base of the 1st metacarpal out the ulnar side of the proximal phalanx and then up into the greater multangular residual.  At this point, I released the tourniquet and there was no unusual bleeding, some small bleeders were cauterized with bipolar cautery.  The palmaris tendon graft was placed in the cavity left by bone resection as well as a small piece of Gelfoam.  I then closed the wound in layers with interrupted 3-0 Vicryl in the capsule and 4-0 nylon mattress sutures in the skin and subcutaneous tissue.  It should also be noted that the thumb was well vascularized.  Betadine, Adaptic dry sterile dressing, and a thumb spica splint cast was applied.  She tolerated the procedure well and was taken to the recovery room in a satisfactory condition  with no known complications.          ______________________________ Marlowe Kays, M.D.     JA/MEDQ  D:  07/29/2012  T:  07/30/2012  Job:  [id number]

## 2012-07-30 NOTE — Progress Notes (Signed)
Orthopedics Progress Note  Subjective: Pt c/o moderate to severe pain to left thumb today Received multiple phone calls last pm about pt's meds and pain issues  Objective:  Filed Vitals:   07/30/12 0747  BP:   Pulse:   Temp:   Resp: 13    General: Awake and alert  Musculoskeletal: left thumb dressing and splint intact, nv intact distally in other fingers Neurovascularly intact  Lab Results  Component Value Date   WBC 7.5 07/29/2012   HGB 13.9 07/29/2012   HCT 42.5 07/29/2012   MCV 92.4 07/29/2012   PLT 181 07/29/2012       Component Value Date/Time   NA 139 07/29/2012 1100   K 3.7 07/29/2012 1100   CL 103 07/29/2012 1100   CO2 30 07/29/2012 1100   GLUCOSE 86 07/29/2012 1100   BUN 10 07/29/2012 1100   CREATININE 0.63 07/29/2012 1100   CALCIUM 8.7 07/29/2012 1100   GFRNONAA >90 07/29/2012 1100   GFRAA >90 07/29/2012 1100    Lab Results  Component Value Date   INR 1.76* 08/05/2010   INR 1.57* 08/04/2010   INR 1.41 08/03/2010    Assessment/Plan: POD #1 s/p Procedure(s):left FINGER ARTHROPLASTY  Due to poor pain control recommend another day before d/c home Pt in agreement Pulmonary toilet  Viviann Spare R. Ranell Patrick, MD 07/30/2012 7:53 AM

## 2012-07-30 NOTE — Progress Notes (Signed)
Cm spoke with patient concerning dc planning. Per pt no HH services needed. Pt states family to assist in home care. Patient uses [facility] in [place]. Pt states has PCP.   Leonie Green [phone]

## 2012-07-31 MED ORDER — HYDROMORPHONE HCL 2 MG PO TABS: 2.0000 mg | ORAL_TABLET | ORAL | Status: AC | PRN

## 2012-07-31 NOTE — Progress Notes (Signed)
   Subjective: 2 Days Post-Op Procedure(s) (LRB): FINGER ARTHROPLASTY (Left)   Patient reports pain as mild, pain under much better control after switching to oral Dilaudid. She is still in some pain, but feeling much better.   Objective:   VITALS:   Filed Vitals:   07/31/12 0337  BP: 150/78  Pulse: 66  Temp: 98.1 F (36.7 C)  Resp: 14    Neurovascular intact  Fingers are slightly swollen Good capillary refill Sensation intact to light touch  LABS  Basename 07/29/12 1100  HGB 13.9  HCT 42.5  WBC 7.5  PLT 181     Basename 07/29/12 1100  NA 139  K 3.7  BUN 10  CREATININE 0.63  GLUCOSE 86     Assessment/Plan: 2 Days Post-Op Procedure(s) (LRB): FINGER ARTHROPLASTY (Left)   Instructed to keep the fingers elevated Continued use of ice Discharge home with home health   Anastasio Auerbach. Marchele Decock   PAC  07/31/2012, 8:17 AM

## 2012-07-31 NOTE — Progress Notes (Addendum)
Discharged to family auto via wheelchair. Assessment unchanged from am. 

## 2012-08-02 ENCOUNTER — Encounter (HOSPITAL_COMMUNITY): Payer: Self-pay | Admitting: Orthopedic Surgery

## 2012-08-04 NOTE — Discharge Summary (Signed)
[name], [name] NO.:  [id number]  MEDICAL RECORD NO.:  [id number]  LOCATION:  1613                         FACILITY:  [facility]  PHYSICIAN:  Marlowe Kays, M.D.  DATE OF BIRTH:  October 08, 1951  DATE OF ADMISSION:  07/29/2012 DATE OF DISCHARGE:  07/31/2012                              DISCHARGE SUMMARY   ADMITTING DIAGNOSIS:  Painful carpometacarpal osteoarthritis, left thumb.  DISCHARGE DIAGNOSIS:  Painful carpometacarpal osteoarthritis, left thumb.  OPERATION:  Interposition arthroplasty, carpometacarpal joint, left thumb using ipsilateral palmaris longus tendon graft.  SURGEON:  Marlowe Kays, M.D.  SUMMARY:  I have followed this woman for an extensive period of time because of progressively painful carpometacarpal joint arthritis of her left thumb.  This has been unresponsive to medication, bracing, and injections.  She has extensive health issues and consequently it was felt best to perform this as at least an overnight after observation patient.  All preoperative laboratory work and chest x-ray were satisfactory.  The operation went uneventfully without complication.  Postoperatively, she had a good bit of pain dictating her staying next 2 days in the hospital.  At the time of discharge, she was afebrile and was discharged on her extensive preadmission medications including several for pain with plan to have her return to my office 2 weeks from surgery for removal of sutures and placement of thumb spica cast.  CONDITION AT DISCHARGE:  Stable.          ______________________________ Marlowe Kays, M.D.     JA/MEDQ  D:  08/03/2012  T:  08/04/2012  Job:  [id number]

## 2013-01-12 DIAGNOSIS — I7300 Raynaud's syndrome without gangrene: Secondary | ICD-10-CM | POA: Insufficient documentation

## 2013-01-12 DIAGNOSIS — M419 Scoliosis, unspecified: Secondary | ICD-10-CM | POA: Insufficient documentation

## 2013-01-12 DIAGNOSIS — F1190 Opioid use, unspecified, uncomplicated: Secondary | ICD-10-CM | POA: Insufficient documentation

## 2013-01-12 DIAGNOSIS — M25561 Pain in right knee: Secondary | ICD-10-CM | POA: Insufficient documentation

## 2013-01-12 DIAGNOSIS — M5030 Other cervical disc degeneration, unspecified cervical region: Secondary | ICD-10-CM | POA: Insufficient documentation

## 2013-04-13 DIAGNOSIS — M47816 Spondylosis without myelopathy or radiculopathy, lumbar region: Secondary | ICD-10-CM | POA: Insufficient documentation

## 2013-05-08 DIAGNOSIS — F32A Depression, unspecified: Secondary | ICD-10-CM | POA: Insufficient documentation

## 2013-07-01 DIAGNOSIS — G43709 Chronic migraine without aura, not intractable, without status migrainosus: Secondary | ICD-10-CM | POA: Insufficient documentation

## 2015-04-05 DIAGNOSIS — M62830 Muscle spasm of back: Secondary | ICD-10-CM | POA: Insufficient documentation

## 2015-05-29 DIAGNOSIS — M797 Fibromyalgia: Secondary | ICD-10-CM | POA: Insufficient documentation

## 2016-04-07 ENCOUNTER — Other Ambulatory Visit: Payer: Self-pay | Admitting: Gastroenterology

## 2016-04-07 DIAGNOSIS — Z86010 Personal history of colonic polyps: Secondary | ICD-10-CM

## 2016-04-07 DIAGNOSIS — R1011 Right upper quadrant pain: Secondary | ICD-10-CM

## 2016-04-07 DIAGNOSIS — D122 Benign neoplasm of ascending colon: Secondary | ICD-10-CM

## 2016-04-14 ENCOUNTER — Ambulatory Visit
Admission: RE | Admit: 2016-04-14 | Discharge: 2016-04-14 | Disposition: A | Discharge status: Home / Self Care | Payer: Medicare Other | Source: Ambulatory Visit | Attending: Gastroenterology | Admitting: Gastroenterology

## 2016-04-14 DIAGNOSIS — R1011 Right upper quadrant pain: Secondary | ICD-10-CM

## 2016-04-14 DIAGNOSIS — Z86010 Personal history of colonic polyps: Secondary | ICD-10-CM

## 2016-04-14 DIAGNOSIS — D122 Benign neoplasm of ascending colon: Secondary | ICD-10-CM

## 2016-04-14 MED ORDER — IOPAMIDOL (ISOVUE-300) INJECTION 61%
100.0000 mL | Freq: Once | INTRAVENOUS | Status: AC | PRN
  Administered 2016-04-14: 100 mL via INTRAVENOUS

## 2017-02-01 ENCOUNTER — Ambulatory Visit (INDEPENDENT_AMBULATORY_CARE_PROVIDER_SITE_OTHER): Payer: Self-pay | Admitting: Orthopaedic Surgery

## 2017-02-08 ENCOUNTER — Ambulatory Visit (INDEPENDENT_AMBULATORY_CARE_PROVIDER_SITE_OTHER): Payer: Medicare Other | Admitting: Orthopaedic Surgery

## 2017-02-08 ENCOUNTER — Encounter (INDEPENDENT_AMBULATORY_CARE_PROVIDER_SITE_OTHER): Payer: Self-pay

## 2017-02-08 DIAGNOSIS — M25512 Pain in left shoulder: Secondary | ICD-10-CM

## 2017-02-08 DIAGNOSIS — M25511 Pain in right shoulder: Secondary | ICD-10-CM

## 2017-02-08 DIAGNOSIS — G8929 Other chronic pain: Secondary | ICD-10-CM

## 2017-02-08 MED ORDER — METHYLPREDNISOLONE ACETATE 40 MG/ML IJ SUSP
40.0000 mg | INTRAMUSCULAR | Status: AC | PRN
  Administered 2017-02-08: 40 mg via INTRA_ARTICULAR

## 2017-02-08 NOTE — Progress Notes (Signed)
Office Visit Note   Patient: [name]           Date of Birth: [birth year]           MRN: [id number] Visit Date: 02/08/2017              Requested by: Kelton Pillar, MD [address] [place], [place] [address] PCP: Osborne Casco, MD   Assessment & Plan: Visit Diagnoses:  1. Chronic pain of both shoulders     Plan:  Per her request we placed injections in both of her shoulder subacromial spaces. She understands the risk moves his injections well on his had such good relief from in the past when this will likely help her as well. She is artery on chronic medications relation for fibromyalgia. She'll follow-up as needed.  Follow-Up Instructions: Return if symptoms worsen or fail to improve.   Orders:  Orders Placed This Encounter  Procedures  . Large Joint Injection/Arthrocentesis  . Large Joint Injection/Arthrocentesis   No orders of the defined types were placed in this encounter.     Procedures: Large Joint Inj Date/Time: 02/08/2017 1:45 PM Performed by: Mcarthur Rossetti Authorized by: Jean Rosenthal Y   Location:  Shoulder Site:  R subacromial bursa Ultrasound Guidance: No   Fluoroscopic Guidance: No   Arthrogram: No   Medications:  40 mg methylPREDNISolone acetate 40 MG/ML Large Joint Inj Date/Time: 02/08/2017 1:46 PM Performed by: Mcarthur Rossetti Authorized by: Mcarthur Rossetti   Location:  Shoulder Site:  L subacromial bursa Ultrasound Guidance: No   Fluoroscopic Guidance: No   Arthrogram: No   Medications:  40 mg methylPREDNISolone acetate 40 MG/ML     Clinical Data: No additional findings.   Subjective: No chief complaint on file. The patient is well-known to [place]. She has a history of fibromyalgia and chronic pain syndrome as well as fatigue. She has had successful subacromial injections in the past. She comes in today requesting bilateral shoulder injections. It's been very long time since we  provided any type of steroid injections in her shoulders.  She reports pain mainly with overhead activities. She says a lot of is related to or from her meralgia. The injections and provided the past of helped her greatly.  HPI  Review of Systems She currently denies any chest pain, short of breath, fever, chills, nausea, vomiting.  Objective: Vital Signs: There were no vitals taken for this visit.  Physical Exam He is alert and oriented 3 and in no acute distress Ortho Exam Examination of both of her shoulder show well located shoulders. She has pain with over activities and reaching across in front of her. There is noted strength deficits or muscle atrophy of either shoulder. It's mainly trigger point muscle type of pain as well as impingement type of pain. Specialty Comments:  No specialty comments available.  Imaging: No results found.   PMFS History: There are no active problems to display for this patient.  Past Medical History:  Diagnosis Date  . Anxiety    states frequent  anxiety attacks  . Asthma   . Cold no drainage   since thursday 07-21-12, chest xray done 07-24-2012   . COPD (chronic obstructive pulmonary disease)    LOV with PFT 1/13  Dr Linde Gillis on chart-   pt states no changes since last saw pulmonary  . Depression   . Fibromyalgia    states has had trigger point injections shoulders-   . GERD (gastroesophageal  reflux disease)   . Headache    migraines  . History of DVT of lower extremity 1972   right leg  . Hyperlipidemia   . Hypothyroidism   . OA (osteoarthritis)   . On home oxygen therapy    2 liters per minute per [place] as needed  . Seasonal allergies    sgtates clear nasal drainage x 3- days- states no fever  . Shortness of breath    oxygen at 2 liters at night    No family history on file.  Past Surgical History:  Procedure Laterality Date  . ABDOMINAL HYSTERECTOMY  1990   bilateral salpingo-oophorectomy  . [place]  . BOWEL SURGERY FOR OBSTRUCTION  1992  . CLOSED MANIPULATION RIGHT KNEE REPLACEMENT  09-10-2010  . DIAGNOSTIC LAPAROSCOPY  1989   x2-3 with ovarian cystectomy  . FINGER ARTHROPLASTY  07/29/2012   Procedure: FINGER ARTHROPLASTY;  Surgeon: Magnus Sinning, MD;  Location: [facility];  Service: Orthopedics;  Laterality: Left;  Left Thumb, CMC Interpositional Arthroplasty Left Thumb,with Palmaris Longis Tendon Graft, IPSI Lateral     . HEMORRHOID SURGERY  1988  . KNEE ARTHROSCOPY  10-26-2008   AND REMOVAL 2 SCREWS PROXIMAL TIBIA  OF RIGHT KNEE  . KNEE ARTHROTOMY  LAST ONE 2000   right x 3 prior to replacement  . LEFT SHOULDER ARTHROSCOPY  03-02-2012  . THYROID LOBECTOMY  2009   PARTIAL LEFT LOWER LOBECTOMY  . TONSILLECTOMY  1959  . TOTAL KNEE ARTHROPLASTY  07-31-2010   OA  RIGHT KNEE   Social History   Occupational History  . Not on file.   Social History Main Topics  . Smoking status: Current Every Day Smoker    Packs/day: 2.00    Years: 43.00    Types: Cigarettes  . Smokeless tobacco: Never Used  . Alcohol use No  . Drug use: No  . Sexual activity: Not on file

## 2017-08-11 ENCOUNTER — Ambulatory Visit (INDEPENDENT_AMBULATORY_CARE_PROVIDER_SITE_OTHER): Payer: Medicare Other

## 2017-08-11 ENCOUNTER — Ambulatory Visit (INDEPENDENT_AMBULATORY_CARE_PROVIDER_SITE_OTHER): Payer: Medicare Other | Admitting: Orthopaedic Surgery

## 2017-08-11 DIAGNOSIS — M25512 Pain in left shoulder: Secondary | ICD-10-CM | POA: Diagnosis not present

## 2017-08-11 DIAGNOSIS — M25521 Pain in right elbow: Secondary | ICD-10-CM

## 2017-08-11 DIAGNOSIS — M25511 Pain in right shoulder: Secondary | ICD-10-CM | POA: Diagnosis not present

## 2017-08-11 DIAGNOSIS — G8929 Other chronic pain: Secondary | ICD-10-CM

## 2017-08-11 DIAGNOSIS — M25522 Pain in left elbow: Secondary | ICD-10-CM | POA: Diagnosis not present

## 2017-08-11 MED ORDER — LIDOCAINE HCL 1 % IJ SOLN
3.0000 mL | INTRAMUSCULAR | Status: AC | PRN
  Administered 2017-08-11: 3 mL

## 2017-08-11 MED ORDER — METHYLPREDNISOLONE ACETATE 40 MG/ML IJ SUSP
40.0000 mg | INTRAMUSCULAR | Status: AC | PRN
  Administered 2017-08-11: 40 mg via INTRA_ARTICULAR

## 2017-08-11 NOTE — Progress Notes (Signed)
Office Visit Note   Patient: [name]           Date of Birth: [birth year]           MRN: [id number] Visit Date: 08/11/2017              Requested by: Kelton Pillar, MD [address] [place], [place] [address] PCP: Kelton Pillar, MD   Assessment & Plan: Visit Diagnoses:  1. Bilateral elbow joint pain   2. Chronic pain of both shoulders     Plan: She did wish to have injections in both her shoulders today in the subacromial space. She's had this before and tolerated it well. She understands the risk and benefits of injections as well. She will follow-up as needed.  Follow-Up Instructions: Return if symptoms worsen or fail to improve.   Orders:  Orders Placed This Encounter  Procedures  . Large Joint Injection/Arthrocentesis  . Large Joint Injection/Arthrocentesis  . XR Elbow 2 Views Left  . XR Elbow 2 Views Right   No orders of the defined types were placed in this encounter.     Procedures: Large Joint Inj Date/Time: 08/11/2017 4:27 PM Performed by: Mcarthur Rossetti Authorized by: Jean Rosenthal Y   Location:  Shoulder Site:  R subacromial bursa Ultrasound Guidance: No   Fluoroscopic Guidance: No   Arthrogram: No   Medications:  3 mL lidocaine 1 %; 40 mg methylPREDNISolone acetate 40 MG/ML Large Joint Inj Date/Time: 08/11/2017 4:27 PM Performed by: Mcarthur Rossetti Authorized by: Mcarthur Rossetti   Location:  Shoulder Site:  L subacromial bursa Ultrasound Guidance: No   Fluoroscopic Guidance: No   Arthrogram: No   Medications:  3 mL lidocaine 1 %; 40 mg methylPREDNISolone acetate 40 MG/ML     Clinical Data: No additional findings.   Subjective: No chief complaint on file. The patient has been seen before. before. She has chronic bilateral shoulder pain becomes in today with chief complaint of also bilateral elbow pain. She said someone was recently diagnosed her with Raynaud's syndrome. Her pain is  daily and it hurts mainly with activities. She points to her elbow saying it is global pain and it does radiate both shoulders as well. She is able to actively use her shoulders and elbows other just painful to her.  HPI  Review of Systems She currently denies any headache, chest pain, short of breath, fever, chills, nausea, vomiting.  Objective: Vital Signs: There were no vitals taken for this visit.  Physical Exam She is alert and oriented 3 and in no acute distress Ortho Exam Examination of both shoulder show significant pain with range of motion that is full and no significant glenohumeral humeral grinding. The shoulders are both well located. There is no significant weakness just mainly pain in the subacromial outlet. Both elbows have normal exam in terms of no effusion. Range of motion of both elbows are full. There is no ligamentous instability of either elbow. Her Tinel sign is negative on either elbow. Specialty Comments:  No specialty comments available.  Imaging: Xr Elbow 2 Views Left  Result Date: 08/11/2017 2 views left elbow show no acute findings with normal-appearing left elbow joint.  Xr Elbow 2 Views Right  Result Date: 08/11/2017 2 views of the right elbow show no acute findings. The elbow is well located with no significant arthritic changes.    PMFS History: Patient Active Problem List   Diagnosis Date Noted  . Bilateral elbow joint pain  08/11/2017  . Chronic pain of both shoulders 08/11/2017   Past Medical History:  Diagnosis Date  . Anxiety    states frequent  anxiety attacks  . Asthma   . Cold no drainage   since thursday 07-21-12, chest xray done 07-24-2012   . COPD (chronic obstructive pulmonary disease)    LOV with PFT 1/13  Dr Linde Gillis on chart-   pt states no changes since last saw pulmonary  . Depression   . Fibromyalgia    states has had trigger point injections shoulders-   . GERD (gastroesophageal reflux disease)   . Headache     migraines  . History of DVT of lower extremity 1972   right leg  . Hyperlipidemia   . Hypothyroidism   . OA (osteoarthritis)   . On home oxygen therapy    2 liters per minute per [place] as needed  . Seasonal allergies    sgtates clear nasal drainage x 3- days- states no fever  . Shortness of breath    oxygen at 2 liters at night    No family history on file.  Past Surgical History:  Procedure Laterality Date  . ABDOMINAL HYSTERECTOMY  1990   bilateral salpingo-oophorectomy  . [place]  . BOWEL SURGERY FOR OBSTRUCTION  1992  . CLOSED MANIPULATION RIGHT KNEE REPLACEMENT  09-10-2010  . DIAGNOSTIC LAPAROSCOPY  1989   x2-3 with ovarian cystectomy  . FINGER ARTHROPLASTY  07/29/2012   Procedure: FINGER ARTHROPLASTY;  Surgeon: Magnus Sinning, MD;  Location: [facility];  Service: Orthopedics;  Laterality: Left;  Left Thumb, CMC Interpositional Arthroplasty Left Thumb,with Palmaris Longis Tendon Graft, IPSI Lateral     . HEMORRHOID SURGERY  1988  . KNEE ARTHROSCOPY  10-26-2008   AND REMOVAL 2 SCREWS PROXIMAL TIBIA  OF RIGHT KNEE  . KNEE ARTHROTOMY  LAST ONE 2000   right x 3 prior to replacement  . LEFT SHOULDER ARTHROSCOPY  03-02-2012  . THYROID LOBECTOMY  2009   PARTIAL LEFT LOWER LOBECTOMY  . TONSILLECTOMY  1959  . TOTAL KNEE ARTHROPLASTY  07-31-2010   OA  RIGHT KNEE   Social History   Occupational History  . Not on file.   Social History Main Topics  . Smoking status: Current Every Day Smoker    Packs/day: 2.00    Years: 43.00    Types: Cigarettes  . Smokeless tobacco: Never Used  . Alcohol use No  . Drug use: No  . Sexual activity: Not on file

## 2017-10-25 ENCOUNTER — Ambulatory Visit (INDEPENDENT_AMBULATORY_CARE_PROVIDER_SITE_OTHER): Payer: Medicare Other | Admitting: Physician Assistant

## 2017-11-22 ENCOUNTER — Telehealth (INDEPENDENT_AMBULATORY_CARE_PROVIDER_SITE_OTHER): Payer: Self-pay | Admitting: Orthopaedic Surgery

## 2017-11-22 NOTE — Telephone Encounter (Signed)
Patient aware it is not to soon

## 2017-11-22 NOTE — Telephone Encounter (Signed)
I called patient back from a voicemail that was left and spoke with her husband, patient is requesting bilateral shoulder injections, he stated she had these in September. Can you let patient know if its too soon to receive these again? Her appt is made for 1/14. Thanks! CB # [phone]

## 2017-12-02 DIAGNOSIS — G44229 Chronic tension-type headache, not intractable: Secondary | ICD-10-CM | POA: Insufficient documentation

## 2017-12-06 ENCOUNTER — Encounter (INDEPENDENT_AMBULATORY_CARE_PROVIDER_SITE_OTHER): Payer: Self-pay | Admitting: Orthopaedic Surgery

## 2017-12-06 ENCOUNTER — Ambulatory Visit (INDEPENDENT_AMBULATORY_CARE_PROVIDER_SITE_OTHER): Payer: Medicare Other | Admitting: Orthopaedic Surgery

## 2017-12-06 DIAGNOSIS — M25511 Pain in right shoulder: Secondary | ICD-10-CM

## 2017-12-06 DIAGNOSIS — M25512 Pain in left shoulder: Secondary | ICD-10-CM

## 2017-12-06 DIAGNOSIS — G8929 Other chronic pain: Secondary | ICD-10-CM

## 2017-12-06 MED ORDER — LIDOCAINE HCL 1 % IJ SOLN
3.0000 mL | INTRAMUSCULAR | Status: AC | PRN
  Administered 2017-12-06: 3 mL

## 2017-12-06 MED ORDER — METHYLPREDNISOLONE ACETATE 40 MG/ML IJ SUSP
40.0000 mg | INTRAMUSCULAR | Status: AC | PRN
  Administered 2017-12-06: 40 mg via INTRA_ARTICULAR

## 2017-12-06 NOTE — Progress Notes (Signed)
Office Visit Note   Patient: [name]           Date of Birth: 02-14-1951           MRN: [id number] Visit Date: 12/06/2017              Requested by: Kelton Pillar, MD [address] [place], [place] [address] PCP: Kelton Pillar, MD   Assessment & Plan: Visit Diagnoses:  1. Chronic pain of both shoulders     Plan: I do feel that providing steroid injections is warranted to enter subacromial outlet on both sides.  I do feel that she would benefit the most from physical therapy but she is determined to try things on her own.  Right now I do not think that she needs any type of operation rather her work on her smoking and cessation of this prior to any type of surgery.  She tolerated steroid injections well.  She will follow-up as needed knowing that she should wait at least 4 months or more between steroid injections for her shoulders.  Follow-Up Instructions: Return if symptoms worsen or fail to improve.   Orders:  Orders Placed This Encounter  Procedures  . Large Joint Inj  . Large Joint Inj   No orders of the defined types were placed in this encounter.     Procedures: Large Joint Inj: R subacromial bursa on 12/06/2017 2:46 PM Indications: pain and diagnostic evaluation Details: 22 G 1.5 in needle  Arthrogram: No  Medications: 3 mL lidocaine 1 %; 40 mg methylPREDNISolone acetate 40 MG/ML Outcome: tolerated well, no immediate complications Procedure, treatment alternatives, risks and benefits explained, specific risks discussed. Consent was given by the patient. Immediately prior to procedure a time out was called to verify the correct patient, procedure, equipment, support staff and site/side marked as required. Patient was prepped and draped in the usual sterile fashion.   Large Joint Inj: L subacromial bursa on 12/06/2017 2:46 PM Indications: pain and diagnostic evaluation Details: 22 G 1.5 in needle  Arthrogram: No  Medications: 3 mL lidocaine 1  %; 40 mg methylPREDNISolone acetate 40 MG/ML Outcome: tolerated well, no immediate complications Procedure, treatment alternatives, risks and benefits explained, specific risks discussed. Consent was given by the patient. Immediately prior to procedure a time out was called to verify the correct patient, procedure, equipment, support staff and site/side marked as required. Patient was prepped and draped in the usual sterile fashion.       Clinical Data: No additional findings.   Subjective: Chief Complaint  Patient presents with  . Left Shoulder - Follow-up  . Right Shoulder - Follow-up  The patient has some mild seen before with severe bilateral shoulder impingement.  She is a very thin individual but a longtime smoker.  She is tolerated steroid injections well in the past would like to have them again today.  It has been a while since her last injections.  She denies any other acute changes in her medical status recently.  She is a longtime smoker as well as still smokes.  HPI  Review of Systems She currently denies any headache, chest pain, shortness of breath, fever, chills, nausea, vomiting.  She is alert and oriented x3 and in no acute distress  Objective: Vital Signs: There were no vitals taken for this visit.  Physical Exam She is alert and oriented x3 and in no acute distress examination of both shoulder shows no atrophy around the shoulders but she is a  very thin and Ortho Exam Individual.  Both shoulders abduct without using her deltoids.  Both rotator cuffs appear intact.  She has significant pain in the subacromial outlet and subdeltoid areas.  Both shoulders are well located.  External rotation and internal rotation with adduction of both full.  She has pain mainly with her overhead activities. Specialty Comments:  No specialty comments available.  Imaging: No results found.   PMFS History: Patient Active Problem List   Diagnosis Date Noted  . Bilateral elbow  joint pain 08/11/2017  . Chronic pain of both shoulders 08/11/2017   Past Medical History:  Diagnosis Date  . Anxiety    states frequent  anxiety attacks  . Asthma   . Cold no drainage   since thursday 07-21-12, chest xray done 07-24-2012   . COPD (chronic obstructive pulmonary disease) ([place])    LOV with PFT 1/13  Dr Linde Gillis on chart-   pt states no changes since last saw pulmonary  . Depression   . Fibromyalgia    states has had trigger point injections shoulders-   . GERD (gastroesophageal reflux disease)   . Headache(784.0)    migraines  . History of DVT of lower extremity 1972   right leg  . Hyperlipidemia   . Hypothyroidism   . OA (osteoarthritis)   . On home oxygen therapy    2 liters per minute per [place] as needed  . Seasonal allergies    sgtates clear nasal drainage x 3- days- states no fever  . Shortness of breath    oxygen at 2 liters at night    History reviewed. No pertinent family history.  Past Surgical History:  Procedure Laterality Date  . ABDOMINAL HYSTERECTOMY  1990   bilateral salpingo-oophorectomy  . [place]  . BOWEL SURGERY FOR OBSTRUCTION  1992  . CLOSED MANIPULATION RIGHT KNEE REPLACEMENT  09-10-2010  . DIAGNOSTIC LAPAROSCOPY  1989   x2-3 with ovarian cystectomy  . FINGER ARTHROPLASTY  07/29/2012   Procedure: FINGER ARTHROPLASTY;  Surgeon: Magnus Sinning, MD;  Location: [facility];  Service: Orthopedics;  Laterality: Left;  Left Thumb, CMC Interpositional Arthroplasty Left Thumb,with Palmaris Longis Tendon Graft, IPSI Lateral     . HEMORRHOID SURGERY  1988  . KNEE ARTHROSCOPY  10-26-2008   AND REMOVAL 2 SCREWS PROXIMAL TIBIA  OF RIGHT KNEE  . KNEE ARTHROTOMY  LAST ONE 2000   right x 3 prior to replacement  . LEFT SHOULDER ARTHROSCOPY  03-02-2012  . THYROID LOBECTOMY  2009   PARTIAL LEFT LOWER LOBECTOMY  . TONSILLECTOMY  1959  . TOTAL KNEE ARTHROPLASTY  07-31-2010   OA  RIGHT KNEE     Social History   Occupational History  . Not on file  Tobacco Use  . Smoking status: Current Every Day Smoker    Packs/day: 2.00    Years: 43.00    Pack years: 86.00    Types: Cigarettes  . Smokeless tobacco: Never Used  Substance and Sexual Activity  . Alcohol use: No  . Drug use: No  . Sexual activity: Not on file

## 2018-04-20 ENCOUNTER — Ambulatory Visit (INDEPENDENT_AMBULATORY_CARE_PROVIDER_SITE_OTHER): Payer: Medicare Other | Admitting: Physician Assistant

## 2018-04-21 ENCOUNTER — Encounter (INDEPENDENT_AMBULATORY_CARE_PROVIDER_SITE_OTHER): Payer: Self-pay | Admitting: Physician Assistant

## 2018-04-21 ENCOUNTER — Ambulatory Visit (INDEPENDENT_AMBULATORY_CARE_PROVIDER_SITE_OTHER): Payer: Medicare Other | Admitting: Physician Assistant

## 2018-04-21 DIAGNOSIS — G8929 Other chronic pain: Secondary | ICD-10-CM

## 2018-04-21 DIAGNOSIS — M25511 Pain in right shoulder: Secondary | ICD-10-CM | POA: Diagnosis not present

## 2018-04-21 DIAGNOSIS — M25512 Pain in left shoulder: Secondary | ICD-10-CM

## 2018-04-21 MED ORDER — LIDOCAINE HCL 1 % IJ SOLN
3.0000 mL | INTRAMUSCULAR | Status: AC | PRN
  Administered 2018-04-21: 3 mL

## 2018-04-21 MED ORDER — METHYLPREDNISOLONE ACETATE 40 MG/ML IJ SUSP
40.0000 mg | INTRAMUSCULAR | Status: AC | PRN
  Administered 2018-04-21: 40 mg via INTRA_ARTICULAR

## 2018-04-21 NOTE — Progress Notes (Signed)
   Procedure Note  Patient: [name]             Date of Birth: 07-14-1951           MRN: [id number]             Visit Date: 04/21/2018 HPI: Mrs. [name] is well-known to Dr. Ninfa Linden service comes in today requesting injections in both shoulders.  She has known severe bilateral shoulder impingement.  She has had no known injury to either shoulder. Review of systems negative for fevers chills chest pain.  Positive for chronic shortness of breath. Procedures: Visit Diagnoses: Chronic pain of both shoulders  Large Joint Inj: bilateral subacromial bursa on 04/21/2018 3:10 PM Indications: pain Details: 22 G 1.5 in needle, superior approach  Arthrogram: No  Medications (Right): 3 mL lidocaine 1 %; 40 mg methylPREDNISolone acetate 40 MG/ML Medications (Left): 3 mL lidocaine 1 %; 40 mg methylPREDNISolone acetate 40 MG/ML Outcome: tolerated well, no immediate complications Procedure, treatment alternatives, risks and benefits explained, specific risks discussed. Consent was given by the patient. Immediately prior to procedure a time out was called to verify the correct patient, procedure, equipment, support staff and site/side marked as required. Patient was prepped and draped in the usual sterile fashion.     Plan: She will follow-up on an as-needed basis.  She needs to wait at least 3 to 4 months between injections in both shoulders.

## 2018-05-12 ENCOUNTER — Telehealth (INDEPENDENT_AMBULATORY_CARE_PROVIDER_SITE_OTHER): Payer: Self-pay | Admitting: Orthopaedic Surgery

## 2018-05-12 ENCOUNTER — Other Ambulatory Visit (INDEPENDENT_AMBULATORY_CARE_PROVIDER_SITE_OTHER): Payer: Self-pay

## 2018-05-12 MED ORDER — METHYLPREDNISOLONE 4 MG PO TBPK: ORAL_TABLET | ORAL | 0 refills | Status: DC

## 2018-05-12 NOTE — Telephone Encounter (Signed)
See below, too early for injections I told her, since knee is hurting too didn't know if we could try medrol dose pack She also mentions brace?

## 2018-05-12 NOTE — Telephone Encounter (Signed)
Ok to ry a medrol dose pack.  Also, she can try an over-the-counter knee sleeve.

## 2018-05-12 NOTE — Telephone Encounter (Signed)
Pt called left VM would like to have a another shot. Pt stated she had an injection 3 weeks ago and still having a lot of pain. Please call pt to discuss pt care

## 2018-05-12 NOTE — Telephone Encounter (Signed)
Patient aware I called this in for her and to get OTC knee sleeve

## 2018-05-12 NOTE — Telephone Encounter (Signed)
Patient called advised the steroid injection did not work. Patient asked if she can get the injections again? Patient also want to discuss her left knee. Patient said her left knee is hurting pretty bad. Patient asked if she can get a brace foe her knee? The number to contact patient is [phone]

## 2018-07-13 ENCOUNTER — Telehealth (INDEPENDENT_AMBULATORY_CARE_PROVIDER_SITE_OTHER): Payer: Self-pay | Admitting: Orthopaedic Surgery

## 2018-07-13 NOTE — Telephone Encounter (Signed)
Although she states the injections did not help, I would be fine with trying injections again tomorrow since it is almost close to 3 months since her last injections.

## 2018-07-13 NOTE — Telephone Encounter (Signed)
See below

## 2018-07-13 NOTE — Telephone Encounter (Signed)
Patient is requesting bilateral shoulder cortisone injections tomorrow- it is before the 3 months since her last were given by Artis Delay on 04/21/18 but she said these did not help. She said she cant sleep at night and can hardly pull the sheets above her without her shoulders killing her. She requests to see Dr. Ninfa Linden only. If she is unable to receive the injections tomorrow she is requesting a call by the end of the day to cancel and then keep the appt on 9/18. Please advise as soon as possible  # [phone]

## 2018-07-14 ENCOUNTER — Ambulatory Visit (INDEPENDENT_AMBULATORY_CARE_PROVIDER_SITE_OTHER): Payer: Medicare Other | Admitting: Orthopaedic Surgery

## 2018-07-14 NOTE — Telephone Encounter (Signed)
Yes she can have injection

## 2018-08-01 ENCOUNTER — Encounter (INDEPENDENT_AMBULATORY_CARE_PROVIDER_SITE_OTHER): Payer: Self-pay | Admitting: Orthopaedic Surgery

## 2018-08-01 ENCOUNTER — Ambulatory Visit (INDEPENDENT_AMBULATORY_CARE_PROVIDER_SITE_OTHER): Payer: Medicare Other | Admitting: Orthopaedic Surgery

## 2018-08-01 DIAGNOSIS — G8929 Other chronic pain: Secondary | ICD-10-CM | POA: Diagnosis not present

## 2018-08-01 DIAGNOSIS — M25512 Pain in left shoulder: Secondary | ICD-10-CM

## 2018-08-01 DIAGNOSIS — M25511 Pain in right shoulder: Secondary | ICD-10-CM | POA: Diagnosis not present

## 2018-08-01 MED ORDER — LIDOCAINE HCL 1 % IJ SOLN
3.0000 mL | INTRAMUSCULAR | Status: AC | PRN
Start: 2018-08-01 — End: 2018-08-01
  Administered 2018-08-01: 3 mL

## 2018-08-01 MED ORDER — METHYLPREDNISOLONE ACETATE 40 MG/ML IJ SUSP
40.0000 mg | INTRAMUSCULAR | Status: AC | PRN
  Administered 2018-08-01: 40 mg via INTRA_ARTICULAR

## 2018-08-01 MED ORDER — LIDOCAINE HCL 1 % IJ SOLN
3.0000 mL | INTRAMUSCULAR | Status: AC | PRN
  Administered 2018-08-01: 3 mL

## 2018-08-01 NOTE — Progress Notes (Signed)
   Procedure Note  Patient: [name]             Date of Birth: 1951/03/07           MRN: [id number]             Visit Date: 08/01/2018  Procedures: Visit Diagnoses: Chronic pain of both shoulders  Large Joint Inj: R subacromial bursa on 08/01/2018 3:14 PM Indications: pain and diagnostic evaluation Details: 22 G 1.5 in needle  Arthrogram: No  Medications: 3 mL lidocaine 1 %; 40 mg methylPREDNISolone acetate 40 MG/ML Outcome: tolerated well, no immediate complications Procedure, treatment alternatives, risks and benefits explained, specific risks discussed. Consent was given by the patient. Immediately prior to procedure a time out was called to verify the correct patient, procedure, equipment, support staff and site/side marked as required. Patient was prepped and draped in the usual sterile fashion.   Large Joint Inj: L subacromial bursa on 08/01/2018 3:14 PM Indications: pain and diagnostic evaluation Details: 22 G 1.5 in needle  Arthrogram: No  Medications: 3 mL lidocaine 1 %; 40 mg methylPREDNISolone acetate 40 MG/ML Outcome: tolerated well, no immediate complications Procedure, treatment alternatives, risks and benefits explained, specific risks discussed. Consent was given by the patient. Immediately prior to procedure a time out was called to verify the correct patient, procedure, equipment, support staff and site/side marked as required. Patient was prepped and draped in the usual sterile fashion.    The patient is well-known to me.  She is a 67 year old female with chronic bilateral shoulder pain.  She had subacromial decompression of left shoulder about 6 years ago by another Psychologist, sport and exercise in town who since retired.  She comes in from injections from time to time.  In May with the last time she had injections in both shoulders and she is at that did not help.  On exam both shoulder seem to move fine but definitely have signs of impingement and pain.  There is no significant  weakness.  Her wishes I did provide a steroid injection subacromial out from both shoulders she tolerated very well.  All question concerns were answered and addressed.  Right now would not consider any type of surgery on her without definitive therapy or trying other modalities.  She understands she is to wait at least 3 to 4 months between injections.  Follow-up will be as needed.

## 2018-08-04 DIAGNOSIS — M7541 Impingement syndrome of right shoulder: Secondary | ICD-10-CM | POA: Insufficient documentation

## 2018-08-04 DIAGNOSIS — M7542 Impingement syndrome of left shoulder: Secondary | ICD-10-CM | POA: Insufficient documentation

## 2018-08-10 ENCOUNTER — Ambulatory Visit (INDEPENDENT_AMBULATORY_CARE_PROVIDER_SITE_OTHER): Payer: Medicare Other | Admitting: Orthopaedic Surgery

## 2018-12-12 ENCOUNTER — Ambulatory Visit (INDEPENDENT_AMBULATORY_CARE_PROVIDER_SITE_OTHER): Payer: Medicare Other | Admitting: Orthopaedic Surgery

## 2018-12-12 ENCOUNTER — Encounter (INDEPENDENT_AMBULATORY_CARE_PROVIDER_SITE_OTHER): Payer: Self-pay | Admitting: Orthopaedic Surgery

## 2018-12-12 DIAGNOSIS — M25511 Pain in right shoulder: Secondary | ICD-10-CM | POA: Diagnosis not present

## 2018-12-12 DIAGNOSIS — G8929 Other chronic pain: Secondary | ICD-10-CM | POA: Diagnosis not present

## 2018-12-12 DIAGNOSIS — M25512 Pain in left shoulder: Secondary | ICD-10-CM | POA: Diagnosis not present

## 2018-12-12 MED ORDER — METHYLPREDNISOLONE ACETATE 40 MG/ML IJ SUSP
40.0000 mg | INTRAMUSCULAR | Status: AC | PRN
  Administered 2018-12-12: 40 mg via INTRA_ARTICULAR

## 2018-12-12 MED ORDER — LIDOCAINE HCL 1 % IJ SOLN
1.0000 mL | INTRAMUSCULAR | Status: AC | PRN
  Administered 2018-12-12: 1 mL

## 2018-12-12 NOTE — Progress Notes (Signed)
   Procedure Note  Patient: [name]             Date of Birth: 1951-07-17           MRN: [id number]             Visit Date: 12/12/2018  Procedures: Visit Diagnoses: Chronic pain of both shoulders  Large Joint Inj: bilateral subacromial bursa on 12/12/2018 4:08 PM Indications: pain and diagnostic evaluation Details: 22 G 1.5 in needle  Arthrogram: No  Medications (Right): 1 mL lidocaine 1 %; 40 mg methylPREDNISolone acetate 40 MG/ML Medications (Left): 1 mL lidocaine 1 %; 40 mg methylPREDNISolone acetate 40 MG/ML Outcome: tolerated well, no immediate complications Procedure, treatment alternatives, risks and benefits explained, specific risks discussed. Consent was given by the patient. Immediately prior to procedure a time out was called to verify the correct patient, procedure, equipment, support staff and site/side marked as required. Patient was prepped and draped in the usual sterile fashion.    The patient comes in today with chronic bilateral shoulder pain.  She has significant impingement in both shoulders and from time to time we did place steroid injections in the subacromial space.  She is had no other change in medical status but has shoulder issues again that she would like to have steroid injections in both shoulders today.  The right hurts are always worse than left.  On clinical exam she shows signs of impingement but both shoulders move fluidly and smooth they are just painful.  I agree with injections again in both shoulder since is been over 4 months since her last injections.  She tolerated these well.  Follow-up will be as needed.  As she was leaving she did let me know that she has dealt with severe left hand pain for a long period of time.  She had a [place] arthroplasty done years ago remotely by a physician that has since retired.  She would like a referral to hand specialist to see if there is anything that they can recommend for her.  I will send her to the [facility] for them to assess her left hand given her previous [facility] arthroplasty surgery.

## 2019-05-17 ENCOUNTER — Other Ambulatory Visit: Payer: Self-pay

## 2019-05-17 ENCOUNTER — Ambulatory Visit (INDEPENDENT_AMBULATORY_CARE_PROVIDER_SITE_OTHER): Payer: Medicare Other | Admitting: Orthopaedic Surgery

## 2019-05-17 ENCOUNTER — Encounter: Payer: Self-pay | Admitting: Orthopaedic Surgery

## 2019-05-17 ENCOUNTER — Ambulatory Visit (INDEPENDENT_AMBULATORY_CARE_PROVIDER_SITE_OTHER): Payer: Medicare Other

## 2019-05-17 VITALS — Ht 62.0 in | Wt 118.0 lb

## 2019-05-17 DIAGNOSIS — M79641 Pain in right hand: Secondary | ICD-10-CM | POA: Diagnosis not present

## 2019-05-17 DIAGNOSIS — M25512 Pain in left shoulder: Secondary | ICD-10-CM

## 2019-05-17 DIAGNOSIS — G8929 Other chronic pain: Secondary | ICD-10-CM | POA: Diagnosis not present

## 2019-05-17 DIAGNOSIS — M25511 Pain in right shoulder: Secondary | ICD-10-CM

## 2019-05-17 MED ORDER — LIDOCAINE HCL 1 % IJ SOLN
3.0000 mL | INTRAMUSCULAR | Status: AC | PRN
  Administered 2019-05-17: 3 mL

## 2019-05-17 MED ORDER — METHYLPREDNISOLONE ACETATE 40 MG/ML IJ SUSP
40.0000 mg | INTRAMUSCULAR | Status: AC | PRN
  Administered 2019-05-17: 40 mg via INTRA_ARTICULAR

## 2019-05-17 NOTE — Progress Notes (Signed)
Office Visit Note   Patient: [name]           Date of Birth: September 23, 1951           MRN: [id number] Visit Date: 05/17/2019              Requested by: Kelton Pillar, MD [place] [address] [place],  [place] [address] PCP: Kelton Pillar, MD   Assessment & Plan: Visit Diagnoses:  1. Pain in right hand     Plan: I did provide a steroid injection in each shoulder subacromial space per her request.  She understands the risk and benefits of injections.  She would like a referral to the [facility] for hand specialist to evaluate her index finger and I think this is reasonable.  She also would like to talk them about her left thumb.  We will work on making that referral.  Follow-Up Instructions: Return if symptoms worsen or fail to improve.   Orders:  Orders Placed This Encounter  Procedures  . Large Joint Inj  . Large Joint Inj  . XR Hand Complete Right   No orders of the defined types were placed in this encounter.     Procedures: Large Joint Inj: R subacromial bursa on 05/17/2019 3:17 PM Indications: pain and diagnostic evaluation Details: 22 G 1.5 in needle  Arthrogram: No  Medications: 3 mL lidocaine 1 %; 40 mg methylPREDNISolone acetate 40 MG/ML Outcome: tolerated well, no immediate complications Procedure, treatment alternatives, risks and benefits explained, specific risks discussed. Consent was given by the patient. Immediately prior to procedure a time out was called to verify the correct patient, procedure, equipment, support staff and site/side marked as required. Patient was prepped and draped in the usual sterile fashion.   Large Joint Inj: L subacromial bursa on 05/17/2019 3:17 PM Indications: pain and diagnostic evaluation Details: 22 G 1.5 in needle  Arthrogram: No  Medications: 3 mL lidocaine 1 %; 40 mg methylPREDNISolone acetate 40 MG/ML Outcome: tolerated well, no immediate complications Procedure, treatment alternatives,  risks and benefits explained, specific risks discussed. Consent was given by the patient. Immediately prior to procedure a time out was called to verify the correct patient, procedure, equipment, support staff and site/side marked as required. Patient was prepped and draped in the usual sterile fashion.       Clinical Data: No additional findings.   Subjective: Chief Complaint  Patient presents with  . Right Hand - Pain  . Right Shoulder - Pain  . Left Shoulder - Pain  The patient comes in today with bilateral shoulder pain hoping to have steroid injections in the subacromial space of both shoulders.  She last had these in January of this year.  She has chronic shoulder pain.  She also wants me to look at her left hand from previous basilar thumb joint arthritis surgery that she had by Dr. Shellia Carwin many years ago.  She says is never been right for her.  Her main complaint today though is left index finger pain from injury that recently.  She says her fingers have been corrected with time is been getting worse.  She is right-hand dominant.  She is 67 year old and is a smoker.  HPI  Review of Systems She currently denies any headache, chest pain, shortness of breath, fever, chills, nausea, vomiting  Objective: Vital Signs: Ht 5\' 2"  (1.575 m)   Wt 118 lb (53.5 kg)   BMI 21.58 kg/m   Physical Exam She is  alert and orient x3 and in no acute distress Ortho Exam Examination of both shoulder shows that they are well located.  They have excellent range of motion.  She has positive Neer and Hawkins signs but no strength deficits.  Examination of her left thumb shows a well-healed surgical incision at the wrist and the thumb consistent with a CMC arthroplasty.  There is a negative grind test but she says it does hurt and is deformed.  It is difficult to see that.  Examination of her right hand does show swelling at the DIP joint.  She is very tender to palpation of the DIP and MCP joint of her  index finger.  There is deftly arthritic changes on clinical exam in the right index finger DIP and MCP joint. Specialty Comments:  No specialty comments available.  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    PMFS History: Patient Active Problem List   Diagnosis Date Noted  . Bilateral elbow joint pain 08/11/2017  . Chronic pain of both shoulders 08/11/2017   Past Medical History:  Diagnosis Date  . Anxiety    states frequent  anxiety attacks  . Asthma   . Cold no drainage   since thursday 07-21-12, chest xray done 07-24-2012   . COPD (chronic obstructive pulmonary disease) ([place])    LOV with PFT 1/13  Dr [place]e Gillis on chart-   pt states no changes since last saw pulmonary  . Depression   . Fibromyalgia    states has had trigger point injections shoulders-   . GERD (gastroesophageal reflux disease)   . Headache(784.0)    migraines  . History of DVT of lower extremity 1972   right leg  . Hyperlipidemia   . Hypothyroidism   . OA (osteoarthritis)   . On home oxygen therapy    2 liters per minute per [place] as needed  . Seasonal allergies    sgtates clear nasal drainage x 3- days- states no fever  . Shortness of breath    oxygen at 2 liters at night    History reviewed. No pertinent family history.  Past Surgical History:  Procedure Laterality Date  . ABDOMINAL HYSTERECTOMY  1990   bilateral salpingo-oophorectomy  . [place]  . BOWEL SURGERY FOR OBSTRUCTION  1992  . CLOSED MANIPULATION RIGHT KNEE REPLACEMENT  09-10-2010  . DIAGNOSTIC LAPAROSCOPY  1989   x2-3 with ovarian cystectomy  . FINGER ARTHROPLASTY  07/29/2012   Procedure: FINGER ARTHROPLASTY;  Surgeon: Magnus Sinning, MD;  Location: [facility];  Service: Orthopedics;  Laterality: Left;  Left Thumb, CMC Interpositional Arthroplasty Left Thumb,with Palmaris Longis Tendon Graft, IPSI Lateral     . HEMORRHOID SURGERY  1988  . KNEE ARTHROSCOPY  10-26-2008   AND REMOVAL 2 SCREWS PROXIMAL TIBIA  OF RIGHT KNEE  . KNEE ARTHROTOMY  LAST ONE 2000   right x 3 prior to replacement  . LEFT SHOULDER ARTHROSCOPY  03-02-2012  . THYROID LOBECTOMY  2009   PARTIAL LEFT LOWER LOBECTOMY  . TONSILLECTOMY  1959  . TOTAL KNEE ARTHROPLASTY  07-31-2010   OA  RIGHT KNEE   Social History   Occupational History  . Not on  file  Tobacco Use  . Smoking status: Current Every Day Smoker    Packs/day: 2.00    Years: 43.00    Pack years: 86.00    Types: Cigarettes  . Smokeless tobacco: Never Used  Substance and Sexual Activity  . Alcohol use: No  . Drug use: No  . Sexual activity: Not on file

## 2019-05-18 ENCOUNTER — Other Ambulatory Visit: Payer: Self-pay

## 2019-05-18 DIAGNOSIS — M79641 Pain in right hand: Secondary | ICD-10-CM

## 2019-08-04 ENCOUNTER — Other Ambulatory Visit: Payer: Self-pay | Admitting: Family Medicine

## 2019-08-04 DIAGNOSIS — M810 Age-related osteoporosis without current pathological fracture: Secondary | ICD-10-CM

## 2019-08-04 DIAGNOSIS — Z79899 Other long term (current) drug therapy: Secondary | ICD-10-CM

## 2019-10-16 ENCOUNTER — Other Ambulatory Visit: Payer: Medicare Other

## 2019-11-15 ENCOUNTER — Encounter: Payer: Self-pay | Admitting: Orthopaedic Surgery

## 2019-11-15 ENCOUNTER — Ambulatory Visit (INDEPENDENT_AMBULATORY_CARE_PROVIDER_SITE_OTHER): Payer: Medicare Other | Admitting: Orthopaedic Surgery

## 2019-11-15 ENCOUNTER — Other Ambulatory Visit: Payer: Self-pay

## 2019-11-15 DIAGNOSIS — G8929 Other chronic pain: Secondary | ICD-10-CM

## 2019-11-15 DIAGNOSIS — M25512 Pain in left shoulder: Secondary | ICD-10-CM

## 2019-11-15 DIAGNOSIS — M25511 Pain in right shoulder: Secondary | ICD-10-CM | POA: Diagnosis not present

## 2019-11-15 MED ORDER — METHYLPREDNISOLONE ACETATE 40 MG/ML IJ SUSP
40.0000 mg | INTRAMUSCULAR | Status: AC | PRN
Start: 2019-11-15 — End: 2019-11-15
  Administered 2019-11-15: 40 mg via INTRA_ARTICULAR

## 2019-11-15 MED ORDER — LIDOCAINE HCL 1 % IJ SOLN
3.0000 mL | INTRAMUSCULAR | Status: AC | PRN
  Administered 2019-11-15: 3 mL

## 2019-11-15 MED ORDER — METHYLPREDNISOLONE ACETATE 40 MG/ML IJ SUSP
40.0000 mg | INTRAMUSCULAR | Status: AC | PRN
  Administered 2019-11-15: 40 mg via INTRA_ARTICULAR

## 2019-11-15 NOTE — Progress Notes (Signed)
Office Visit Note   Patient: [name]           Date of Birth: 1951/07/27           MRN: [id number] Visit Date: 11/15/2019              Requested by: Kelton Pillar, MD [place] [address] [place],   [id number] PCP: Kelton Pillar, MD   Assessment & Plan: Visit Diagnoses:  1. Chronic pain of both shoulders     Plan: I was able to successfully place a steroid injection in both shoulder subacromial outlets which she tolerated well.  Having had these before she is fully aware of the risk and benefits of injections.  All question concerns were answered and addressed.  Follow-up will be as needed.  Follow-Up Instructions: Return if symptoms worsen or fail to improve.   Orders:  Orders Placed This Encounter  Procedures  . Large Joint Inj  . Large Joint Inj   No orders of the defined types were placed in this encounter.     Procedures: Large Joint Inj: R subacromial bursa on 11/15/2019 4:57 PM Indications: pain and diagnostic evaluation Details: 22 G 1.5 in needle  Arthrogram: No  Medications: 3 mL lidocaine 1 %; 40 mg methylPREDNISolone acetate 40 MG/ML Outcome: tolerated well, no immediate complications Procedure, treatment alternatives, risks and benefits explained, specific risks discussed. Consent was given by the patient. Immediately prior to procedure a time out was called to verify the correct patient, procedure, equipment, support staff and site/side marked as required. Patient was prepped and draped in the usual sterile fashion.   Large Joint Inj: L subacromial bursa on 11/15/2019 4:57 PM Indications: pain and diagnostic evaluation Details: 22 G 1.5 in needle  Arthrogram: No  Medications: 3 mL lidocaine 1 %; 40 mg methylPREDNISolone acetate 40 MG/ML Outcome: tolerated well, no immediate complications Procedure, treatment alternatives, risks and benefits explained, specific risks discussed. Consent was given by the patient. Immediately prior to  procedure a time out was called to verify the correct patient, procedure, equipment, support staff and site/side marked as required. Patient was prepped and draped in the usual sterile fashion.       Clinical Data: No additional findings.   Subjective: Chief Complaint  Patient presents with  . Left Shoulder - Follow-up  . Right Shoulder - Follow-up  The patient is well-known to me.  She has chronic bilateral shoulder pain and does come in from time to time for subacromial injection in each shoulder.  She is requesting these today.  Is been 6 months since I injected her last.  She denies any acute change in her medical status.  She is able to move her shoulders all around she states that it just hurt mainly with overhead activities and reaching behind her.  She is an active 68 year old female.  She is very thin and a chronic smoker.  She denies any recent injuries.  Injections have helped her a lot in the past.  HPI  Review of Systems She currently denies any headache, chest pain, shortness of breath, fever, chills, nausea, vomiting  Objective: Vital Signs: There were no vitals taken for this visit.  Physical Exam She is alert and orient x3 and in no acute distress Ortho Exam Examination of both shoulder shows painful arc of motion but no weakness and full motion.  Both shoulders are well located. Specialty Comments:  No specialty comments available.  Imaging: No results found.   PMFS History:  Patient Active Problem List   Diagnosis Date Noted  . Bilateral elbow joint pain 08/11/2017  . Chronic pain of both shoulders 08/11/2017   Past Medical History:  Diagnosis Date  . Anxiety    states frequent  anxiety attacks  . Asthma   . Cold no drainage   since thursday 07-21-12, chest xray done 07-24-2012   . COPD (chronic obstructive pulmonary disease) ([place])    LOV with PFT 1/13  Dr [place]e Gillis on chart-   pt states no changes since last saw pulmonary  . Depression   .  Fibromyalgia    states has had trigger point injections shoulders-   . GERD (gastroesophageal reflux disease)   . Headache(784.0)    migraines  . History of DVT of lower extremity 1972   right leg  . Hyperlipidemia   . Hypothyroidism   . OA (osteoarthritis)   . On home oxygen therapy    2 liters per minute per [place] as needed  . Seasonal allergies    sgtates clear nasal drainage x 3- days- states no fever  . Shortness of breath    oxygen at 2 liters at night    History reviewed. No pertinent family history.  Past Surgical History:  Procedure Laterality Date  . ABDOMINAL HYSTERECTOMY  1990   bilateral salpingo-oophorectomy  . [place]  . BOWEL SURGERY FOR OBSTRUCTION  1992  . CLOSED MANIPULATION RIGHT KNEE REPLACEMENT  09-10-2010  . DIAGNOSTIC LAPAROSCOPY  1989   x2-3 with ovarian cystectomy  . FINGER ARTHROPLASTY  07/29/2012   Procedure: FINGER ARTHROPLASTY;  Surgeon: Magnus Sinning, MD;  Location: [facility];  Service: Orthopedics;  Laterality: Left;  Left Thumb, CMC Interpositional Arthroplasty Left Thumb,with Palmaris Longis Tendon Graft, IPSI Lateral     . HEMORRHOID SURGERY  1988  . KNEE ARTHROSCOPY  10-26-2008   AND REMOVAL 2 SCREWS PROXIMAL TIBIA  OF RIGHT KNEE  . KNEE ARTHROTOMY  LAST ONE 2000   right x 3 prior to replacement  . LEFT SHOULDER ARTHROSCOPY  03-02-2012  . THYROID LOBECTOMY  2009   PARTIAL LEFT LOWER LOBECTOMY  . TONSILLECTOMY  1959  . TOTAL KNEE ARTHROPLASTY  07-31-2010   OA  RIGHT KNEE   Social History   Occupational History  . Not on file  Tobacco Use  . Smoking status: Current Every Day Smoker    Packs/day: 2.00    Years: 43.00    Pack years: 86.00    Types: Cigarettes  . Smokeless tobacco: Never Used  Substance and Sexual Activity  . Alcohol use: No  . Drug use: No  . Sexual activity: Not on file

## 2019-12-20 ENCOUNTER — Other Ambulatory Visit: Payer: Self-pay

## 2019-12-20 ENCOUNTER — Ambulatory Visit
Admission: RE | Admit: 2019-12-20 | Discharge: 2019-12-20 | Disposition: A | Discharge status: Home / Self Care | Payer: Medicare Other | Source: Ambulatory Visit | Attending: Family Medicine | Admitting: Family Medicine

## 2019-12-20 DIAGNOSIS — M810 Age-related osteoporosis without current pathological fracture: Secondary | ICD-10-CM

## 2019-12-20 DIAGNOSIS — Z79899 Other long term (current) drug therapy: Secondary | ICD-10-CM

## 2020-03-08 ENCOUNTER — Other Ambulatory Visit: Payer: Self-pay | Admitting: Internal Medicine

## 2020-03-08 DIAGNOSIS — K860 Alcohol-induced chronic pancreatitis: Secondary | ICD-10-CM | POA: Insufficient documentation

## 2020-03-08 DIAGNOSIS — R1013 Epigastric pain: Secondary | ICD-10-CM

## 2020-03-08 DIAGNOSIS — K861 Other chronic pancreatitis: Secondary | ICD-10-CM

## 2020-03-26 ENCOUNTER — Ambulatory Visit
Admission: RE | Admit: 2020-03-26 | Discharge: 2020-03-26 | Disposition: A | Discharge status: Home / Self Care | Payer: Medicare Other | Source: Ambulatory Visit | Attending: Internal Medicine | Admitting: Internal Medicine

## 2020-03-26 ENCOUNTER — Other Ambulatory Visit: Payer: Self-pay

## 2020-03-26 DIAGNOSIS — R1013 Epigastric pain: Secondary | ICD-10-CM

## 2020-03-26 DIAGNOSIS — K861 Other chronic pancreatitis: Secondary | ICD-10-CM

## 2020-03-26 MED ORDER — IOPAMIDOL (ISOVUE-300) INJECTION 61%
100.0000 mL | Freq: Once | INTRAVENOUS | Status: AC | PRN
  Administered 2020-03-26: 13:00:00 100 mL via INTRAVENOUS

## 2020-05-13 ENCOUNTER — Encounter: Payer: Self-pay | Admitting: Orthopaedic Surgery

## 2020-05-13 ENCOUNTER — Other Ambulatory Visit: Payer: Self-pay

## 2020-05-13 ENCOUNTER — Ambulatory Visit (INDEPENDENT_AMBULATORY_CARE_PROVIDER_SITE_OTHER): Payer: Medicare Other | Admitting: Orthopaedic Surgery

## 2020-05-13 DIAGNOSIS — G8929 Other chronic pain: Secondary | ICD-10-CM

## 2020-05-13 DIAGNOSIS — M25512 Pain in left shoulder: Secondary | ICD-10-CM

## 2020-05-13 DIAGNOSIS — M25511 Pain in right shoulder: Secondary | ICD-10-CM

## 2020-05-13 MED ORDER — METHYLPREDNISOLONE ACETATE 40 MG/ML IJ SUSP
40.0000 mg | INTRAMUSCULAR | Status: AC | PRN
  Administered 2020-05-13: 40 mg via INTRA_ARTICULAR

## 2020-05-13 MED ORDER — LIDOCAINE HCL 1 % IJ SOLN
3.0000 mL | INTRAMUSCULAR | Status: AC | PRN
  Administered 2020-05-13: 3 mL

## 2020-05-13 NOTE — Progress Notes (Signed)
Office Visit Note   Patient: [name]           Date of Birth: 18-Dec-1950           MRN: [id number] Visit Date: 05/13/2020              Requested by: Kelton Pillar, MD [place] [address] [place],  [place] [address] PCP: Kelton Pillar, MD   Assessment & Plan: Visit Diagnoses:  1. Chronic pain of both shoulders     Plan: I did place steroid injections as before in both shoulders without any difficulty.  She is fully aware of the risk and benefits of steroid injections.  All questions and concerns were answered and addressed.  Follow-up can be as needed.  Follow-Up Instructions: No follow-ups on file.   Orders:  Orders Placed This Encounter  Procedures  . Large Joint Inj  . Large Joint Inj   No orders of the defined types were placed in this encounter.     Procedures: Large Joint Inj: R subacromial bursa on 05/13/2020 2:54 PM Indications: pain and diagnostic evaluation Details: 22 G 1.5 in needle  Arthrogram: No  Medications: 3 mL lidocaine 1 %; 40 mg methylPREDNISolone acetate 40 MG/ML Outcome: tolerated well, no immediate complications Procedure, treatment alternatives, risks and benefits explained, specific risks discussed. Consent was given by the patient. Immediately prior to procedure a time out was called to verify the correct patient, procedure, equipment, support staff and site/side marked as required. Patient was prepped and draped in the usual sterile fashion.   Large Joint Inj: L subacromial bursa on 05/13/2020 2:55 PM Indications: pain and diagnostic evaluation Details: 22 G 1.5 in needle  Arthrogram: No  Medications: 3 mL lidocaine 1 %; 40 mg methylPREDNISolone acetate 40 MG/ML Outcome: tolerated well, no immediate complications Procedure, treatment alternatives, risks and benefits explained, specific risks discussed. Consent was given by the patient. Immediately prior to procedure a time out was called to verify the correct patient,  procedure, equipment, support staff and site/side marked as required. Patient was prepped and draped in the usual sterile fashion.       Clinical Data: No additional findings.   Subjective: Chief Complaint  Patient presents with  . Right Shoulder - Follow-up  . Left Shoulder - Follow-up  The patient comes in today requesting steroid injections in both her shoulders.  She is 69 years old and a chronic smoker.  She has chronic bilateral shoulder pain and injections do help her from time to time.  It is been 6 months since we have injected her shoulders.  She is not a diabetic.  She has had no acute change in her medical status.  HPI  Review of Systems She currently denies any headache, chest pain, shortness of breath, fever, chills, nausea, vomiting  Objective: Vital Signs: There were no vitals taken for this visit.  Physical Exam She is alert and orient x3 and in no acute distress Ortho Exam Examination of both shoulder shows she has full range of motion of both shoulders but a lot of pain with positive Neer and Hawkins signs. Specialty Comments:  No specialty comments available.  Imaging: No results found.   PMFS History: Patient Active Problem List   Diagnosis Date Noted  . Bilateral elbow joint pain 08/11/2017  . Chronic pain of both shoulders 08/11/2017   Past Medical History:  Diagnosis Date  . Anxiety    states frequent  anxiety attacks  . Asthma   .  Cold no drainage   since thursday 07-21-12, chest xray done 07-24-2012   . COPD (chronic obstructive pulmonary disease) ([place])    LOV with PFT 1/13  Dr Linde Gillis on chart-   pt states no changes since last saw pulmonary  . Depression   . Fibromyalgia    states has had trigger point injections shoulders-   . GERD (gastroesophageal reflux disease)   . Headache(784.0)    migraines  . History of DVT of lower extremity 1972   right leg  . Hyperlipidemia   . Hypothyroidism   . OA (osteoarthritis)   . On  home oxygen therapy    2 liters per minute per [place] as needed  . Seasonal allergies    sgtates clear nasal drainage x 3- days- states no fever  . Shortness of breath    oxygen at 2 liters at night    History reviewed. No pertinent family history.  Past Surgical History:  Procedure Laterality Date  . ABDOMINAL HYSTERECTOMY  1990   bilateral salpingo-oophorectomy  . [place]  . BOWEL SURGERY FOR OBSTRUCTION  1992  . CLOSED MANIPULATION RIGHT KNEE REPLACEMENT  09-10-2010  . DIAGNOSTIC LAPAROSCOPY  1989   x2-3 with ovarian cystectomy  . FINGER ARTHROPLASTY  07/29/2012   Procedure: FINGER ARTHROPLASTY;  Surgeon: Magnus Sinning, MD;  Location: [facility];  Service: Orthopedics;  Laterality: Left;  Left Thumb, CMC Interpositional Arthroplasty Left Thumb,with Palmaris Longis Tendon Graft, IPSI Lateral     . HEMORRHOID SURGERY  1988  . KNEE ARTHROSCOPY  10-26-2008   AND REMOVAL 2 SCREWS PROXIMAL TIBIA  OF RIGHT KNEE  . KNEE ARTHROTOMY  LAST ONE 2000   right x 3 prior to replacement  . LEFT SHOULDER ARTHROSCOPY  03-02-2012  . THYROID LOBECTOMY  2009   PARTIAL LEFT LOWER LOBECTOMY  . TONSILLECTOMY  1959  . TOTAL KNEE ARTHROPLASTY  07-31-2010   OA  RIGHT KNEE   Social History   Occupational History  . Not on file  Tobacco Use  . Smoking status: Current Every Day Smoker    Packs/day: 2.00    Years: 43.00    Pack years: 86.00    Types: Cigarettes  . Smokeless tobacco: Never Used  Substance and Sexual Activity  . Alcohol use: No  . Drug use: No  . Sexual activity: Not on file

## 2020-07-10 ENCOUNTER — Ambulatory Visit: Payer: Medicare Other | Admitting: Orthopaedic Surgery

## 2020-07-18 ENCOUNTER — Encounter: Payer: Self-pay | Admitting: Orthopaedic Surgery

## 2020-07-18 ENCOUNTER — Ambulatory Visit (INDEPENDENT_AMBULATORY_CARE_PROVIDER_SITE_OTHER): Payer: Medicare Other

## 2020-07-18 ENCOUNTER — Ambulatory Visit (INDEPENDENT_AMBULATORY_CARE_PROVIDER_SITE_OTHER): Payer: Medicare Other | Admitting: Orthopaedic Surgery

## 2020-07-18 DIAGNOSIS — M25562 Pain in left knee: Secondary | ICD-10-CM

## 2020-07-18 MED ORDER — METHYLPREDNISOLONE ACETATE 40 MG/ML IJ SUSP
40.0000 mg | INTRAMUSCULAR | Status: AC | PRN
  Administered 2020-07-18: 40 mg via INTRA_ARTICULAR

## 2020-07-18 MED ORDER — LIDOCAINE HCL 1 % IJ SOLN
3.0000 mL | INTRAMUSCULAR | Status: AC | PRN
  Administered 2020-07-18: 3 mL

## 2020-07-18 NOTE — Progress Notes (Signed)
Office Visit Note   Patient: [name]           Date of Birth: [birth year]           MRN: [id number] Visit Date: 07/18/2020              Requested by: Kelton Pillar, MD [place] [address] [employer] [address] [place],  [place] [address] PCP: Kelton Pillar, MD   Assessment & Plan: Visit Diagnoses:  1. Acute pain of left knee     Plan: I was able to aspirate about 40 cc of fluid off of her knee.  I placed a steroid injection in the knee today.  Her plain films of the knee do not show any significant abnormalities and the joint space is well-maintained.  A MRI of this knee is warranted to rule out a meniscal tear based on the large joint effusion and her mechanical symptoms of locking and catching.  We will work on ordering that MRI.  All questions and concerns were answered and addressed.  Follow-Up Instructions: Return in about 2 weeks (around 08/01/2020).   Orders:  Orders Placed This Encounter  Procedures  . Large Joint Inj: L knee  . XR Knee 1-2 Views Left   No orders of the defined types were placed in this encounter.     Procedures: Large Joint Inj: L knee on 07/18/2020 4:32 PM Indications: diagnostic evaluation and pain Details: 22 G 1.5 in needle, superolateral approach  Arthrogram: No  Medications: 3 mL lidocaine 1 %; 40 mg methylPREDNISolone acetate 40 MG/ML Outcome: tolerated well, no immediate complications Procedure, treatment alternatives, risks and benefits explained, specific risks discussed. Consent was given by the patient. Immediately prior to procedure a time out was called to verify the correct patient, procedure, equipment, support staff and site/side marked as required. Patient was prepped and draped in the usual sterile fashion.       Clinical Data: No additional findings.   Subjective: Chief Complaint  Patient presents with  . Left Knee - Pain  The patient is a 69 year old female well-known to me.  Patient comes in here today with acute pain of  her left knee with swelling and locking and catching.  She has not injured this knee before or had problems with this knee.  She is never had surgery on it.  She does have a history of a right total knee arthroplasty.  She says the knee hurts with pivoting activities and it has been swelling.  It hurts in the back of her left knee.  HPI  Review of Systems She currently denies any headache, chest pain, shortness of breath, fever, chills, nausea, vomiting  Objective: Vital Signs: There were no vitals taken for this visit.  Physical Exam She is alert and orient x3 and in no acute distress Ortho Exam She is a very thin individual and examination of her left knee does show a large effusion.  There is global tenderness especially posterior in the knee. Specialty Comments:  No specialty comments available.  Imaging: XR Knee 1-2 Views Left  Result Date: 07/18/2020 2 views of the left knee show no acute findings.  The medial lateral compartments are still well-maintained.  There are some calcifications around the lateral meniscus and a lateral para-articular osteophyte.    PMFS History: Patient Active Problem List   Diagnosis Date Noted  . Bilateral elbow joint pain 08/11/2017  . Chronic pain of both shoulders 08/11/2017   Past Medical History:  Diagnosis Date  .  Anxiety    states frequent  anxiety attacks  . Asthma   . Cold no drainage   since thursday 07-21-12, chest xray done 07-24-2012   . COPD (chronic obstructive pulmonary disease) ([place])    LOV with PFT 1/13  Dr Linde Gillis on chart-   pt states no changes since last saw pulmonary  . Depression   . Fibromyalgia    states has had trigger point injections shoulders-   . GERD (gastroesophageal reflux disease)   . Headache(784.0)    migraines  . History of DVT of lower extremity 1972   right leg  . Hyperlipidemia   . Hypothyroidism   . OA (osteoarthritis)   . On home oxygen therapy    2 liters per minute per [place] as  needed  . Seasonal allergies    sgtates clear nasal drainage x 3- days- states no fever  . Shortness of breath    oxygen at 2 liters at night    History reviewed. No pertinent family history.  Past Surgical History:  Procedure Laterality Date  . ABDOMINAL HYSTERECTOMY  1990   bilateral salpingo-oophorectomy  . [place]  . BOWEL SURGERY FOR OBSTRUCTION  1992  . CLOSED MANIPULATION RIGHT KNEE REPLACEMENT  09-10-2010  . DIAGNOSTIC LAPAROSCOPY  1989   x2-3 with ovarian cystectomy  . FINGER ARTHROPLASTY  07/29/2012   Procedure: FINGER ARTHROPLASTY;  Surgeon: Magnus Sinning, MD;  Location: [facility];  Service: Orthopedics;  Laterality: Left;  Left Thumb, CMC Interpositional Arthroplasty Left Thumb,with Palmaris Longis Tendon Graft, IPSI Lateral     . HEMORRHOID SURGERY  1988  . KNEE ARTHROSCOPY  10-26-2008   AND REMOVAL 2 SCREWS PROXIMAL TIBIA  OF RIGHT KNEE  . KNEE ARTHROTOMY  LAST ONE 2000   right x 3 prior to replacement  . LEFT SHOULDER ARTHROSCOPY  03-02-2012  . THYROID LOBECTOMY  2009   PARTIAL LEFT LOWER LOBECTOMY  . TONSILLECTOMY  1959  . TOTAL KNEE ARTHROPLASTY  07-31-2010   OA  RIGHT KNEE   Social History   Occupational History  . Not on file  Tobacco Use  . Smoking status: Current Every Day Smoker    Packs/day: 2.00    Years: 43.00    Pack years: 86.00    Types: Cigarettes  . Smokeless tobacco: Never Used  Substance and Sexual Activity  . Alcohol use: No  . Drug use: No  . Sexual activity: Not on file

## 2020-07-19 ENCOUNTER — Other Ambulatory Visit: Payer: Self-pay

## 2020-07-19 DIAGNOSIS — M25562 Pain in left knee: Secondary | ICD-10-CM

## 2020-08-01 ENCOUNTER — Ambulatory Visit: Payer: Medicare Other | Admitting: Orthopaedic Surgery

## 2020-08-05 ENCOUNTER — Telehealth: Payer: Self-pay

## 2020-08-05 NOTE — Telephone Encounter (Signed)
Patient called wanting to be worked in for her left knee pain.  Advised patient that Dr. Ninfa Linden didn't have anything available at this time.  Patient voiced that she understands.

## 2020-08-15 ENCOUNTER — Ambulatory Visit: Payer: Medicare Other | Admitting: Physician Assistant

## 2020-08-19 ENCOUNTER — Other Ambulatory Visit: Payer: Self-pay

## 2020-08-19 ENCOUNTER — Ambulatory Visit
Admission: RE | Admit: 2020-08-19 | Discharge: 2020-08-19 | Disposition: A | Discharge status: Home / Self Care | Payer: Medicare Other | Source: Ambulatory Visit | Attending: Orthopaedic Surgery | Admitting: Orthopaedic Surgery

## 2020-08-19 DIAGNOSIS — G8929 Other chronic pain: Secondary | ICD-10-CM

## 2020-08-22 ENCOUNTER — Ambulatory Visit (INDEPENDENT_AMBULATORY_CARE_PROVIDER_SITE_OTHER): Payer: Medicare Other | Admitting: Orthopaedic Surgery

## 2020-08-22 ENCOUNTER — Other Ambulatory Visit: Payer: Self-pay

## 2020-08-22 ENCOUNTER — Encounter: Payer: Self-pay | Admitting: Orthopaedic Surgery

## 2020-08-22 DIAGNOSIS — M1612 Unilateral primary osteoarthritis, left hip: Secondary | ICD-10-CM

## 2020-08-22 DIAGNOSIS — M25562 Pain in left knee: Secondary | ICD-10-CM | POA: Diagnosis not present

## 2020-08-22 DIAGNOSIS — G8929 Other chronic pain: Secondary | ICD-10-CM | POA: Diagnosis not present

## 2020-08-22 MED ORDER — METHYLPREDNISOLONE ACETATE 40 MG/ML IJ SUSP
40.0000 mg | INTRAMUSCULAR | Status: AC | PRN
  Administered 2020-08-22: 40 mg via INTRA_ARTICULAR

## 2020-08-22 MED ORDER — LIDOCAINE HCL 1 % IJ SOLN
3.0000 mL | INTRAMUSCULAR | Status: AC | PRN
  Administered 2020-08-22: 3 mL

## 2020-08-22 NOTE — Progress Notes (Signed)
Office Visit Note   Patient: [name]           Date of Birth: 1951/01/27           MRN: [id number] Visit Date: 08/22/2020              Requested by: Kelton Pillar, MD [facility] [employer] [facility] [employer] [place],  [place] [address] PCP: Kelton Pillar, MD   Assessment & Plan: Visit Diagnoses:  1. Chronic pain of left knee   2. Unilateral primary osteoarthritis, left hip     Plan: Given the severity of the arthritis in her left knee combined with the macerated degenerative meniscal tear laterally as well as the horizontal tear medially, the 1 treatment option surgically would be a knee replacement.  I described in detail what the surgery involves as well as described the risk and benefits of surgery.  I talked about her operative and postoperative course.  Given the severity of her chronic pain management, we would need to keep her overnight to work on managing her pain following surgery.  All questions and concerns were answered and addressed.  She did wish to have a steroid injection in her knee today since surgery will be likely 5 to 6 weeks away.  Follow-Up Instructions: Return for 2 weeks post-op.   Orders:  Orders Placed This Encounter  Procedures  . Large Joint Inj   No orders of the defined types were placed in this encounter.     Procedures: Large Joint Inj: L knee on 08/22/2020 4:08 PM Indications: diagnostic evaluation and pain Details: 22 G 1.5 in needle, superolateral approach  Arthrogram: No  Medications: 3 mL lidocaine 1 %; 40 mg methylPREDNISolone acetate 40 MG/ML Outcome: tolerated well, no immediate complications Procedure, treatment alternatives, risks and benefits explained, specific risks discussed. Consent was given by the patient. Immediately prior to procedure a time out was called to verify the correct patient, procedure, equipment, support staff and site/side marked as required. Patient was prepped and draped in the usual sterile fashion.        Clinical Data: No additional findings.   Subjective: Chief Complaint  Patient presents with  . Left Knee - Follow-up  The patient comes in today to go over an MRI of her left knee.  She has been having chronic pain in that knee with locking and catching.  She is 69 years old.  She is also on chronic pain management and a chronic smoker.  She is a very thin individual.  HPI  Review of Systems  She currently denies any headache, chest pain, shortness of breath, fever, chills, nausea, vomiting Objective: Vital Signs: There were no vitals taken for this visit.  Physical Exam She is alert and orient x3 and in no acute distress Ortho Exam Examination of her left knee shows a mild effusion.  She has global medial lateral joint line tenderness as well as positive Murray sign to the medial lateral compartments of the knee.  Her ligamentous exam is normal and the knee is stable otherwise with good range of motion but is very painful to her. Specialty Comments:  No specialty comments available.  Imaging: No results found. The MRI of her left knee does show tricompartment arthritis with areas of cartilage loss throughout the knee.  There is also chronic degenerative tears of the medial and lateral meniscus worse on the lateral side with extrusion of the meniscus laterally.  PMFS History: Patient Active Problem List   Diagnosis Date Noted  .  Unilateral primary osteoarthritis, left hip 08/22/2020  . Chronic pain of left knee 08/22/2020  . Bilateral elbow joint pain 08/11/2017  . Chronic pain of both shoulders 08/11/2017   Past Medical History:  Diagnosis Date  . Anxiety    states frequent  anxiety attacks  . Asthma   . Cold no drainage   since thursday 07-21-12, chest xray done 07-24-2012   . COPD (chronic obstructive pulmonary disease) ([place])    LOV with PFT 1/13  Dr Linde Gillis on chart-   pt states no changes since last saw pulmonary  . Depression   . Fibromyalgia     states has had trigger point injections shoulders-   . GERD (gastroesophageal reflux disease)   . Headache(784.0)    migraines  . History of DVT of lower extremity 1972   right leg  . Hyperlipidemia   . Hypothyroidism   . OA (osteoarthritis)   . On home oxygen therapy    2 liters per minute per [place] as needed  . Seasonal allergies    sgtates clear nasal drainage x 3- days- states no fever  . Shortness of breath    oxygen at 2 liters at night    History reviewed. No pertinent family history.  Past Surgical History:  Procedure Laterality Date  . ABDOMINAL HYSTERECTOMY  1990   bilateral salpingo-oophorectomy  . [place]  . BOWEL SURGERY FOR OBSTRUCTION  1992  . CLOSED MANIPULATION RIGHT KNEE REPLACEMENT  09-10-2010  . DIAGNOSTIC LAPAROSCOPY  1989   x2-3 with ovarian cystectomy  . FINGER ARTHROPLASTY  07/29/2012   Procedure: FINGER ARTHROPLASTY;  Surgeon: Magnus Sinning, MD;  Location: [facility];  Service: Orthopedics;  Laterality: Left;  Left Thumb, CMC Interpositional Arthroplasty Left Thumb,with Palmaris Longis Tendon Graft, IPSI Lateral     . HEMORRHOID SURGERY  1988  . KNEE ARTHROSCOPY  10-26-2008   AND REMOVAL 2 SCREWS PROXIMAL TIBIA  OF RIGHT KNEE  . KNEE ARTHROTOMY  LAST ONE 2000   right x 3 prior to replacement  . LEFT SHOULDER ARTHROSCOPY  03-02-2012  . THYROID LOBECTOMY  2009   PARTIAL LEFT LOWER LOBECTOMY  . TONSILLECTOMY  1959  . TOTAL KNEE ARTHROPLASTY  07-31-2010   OA  RIGHT KNEE   Social History   Occupational History  . Not on file  Tobacco Use  . Smoking status: Current Every Day Smoker    Packs/day: 2.00    Years: 43.00    Pack years: 86.00    Types: Cigarettes  . Smokeless tobacco: Never Used  Substance and Sexual Activity  . Alcohol use: No  . Drug use: No  . Sexual activity: Not on file

## 2020-09-25 ENCOUNTER — Telehealth: Payer: Self-pay | Admitting: Orthopaedic Surgery

## 2020-09-25 DIAGNOSIS — K8681 Exocrine pancreatic insufficiency: Secondary | ICD-10-CM | POA: Insufficient documentation

## 2020-09-25 DIAGNOSIS — K5900 Constipation, unspecified: Secondary | ICD-10-CM | POA: Insufficient documentation

## 2020-09-25 NOTE — Telephone Encounter (Signed)
Patient called requesting for Dr. Ninfa Linden to referral for pain management. Patient states Dr. Ninfa Linden is the one that will preform her knee surgery and need a call back to discuss pain management. Please call patient about this matter at [phone].

## 2020-09-25 NOTE — Progress Notes (Signed)
Need orders in epic.  Thanks.

## 2020-09-26 NOTE — Telephone Encounter (Signed)
Please advise 

## 2020-09-26 NOTE — Progress Notes (Signed)
DUE TO COVID-19 ONLY ONE VISITOR IS ALLOWED TO COME WITH YOU AND STAY IN THE WAITING ROOM ONLY DURING PRE OP AND PROCEDURE DAY OF SURGERY. THE 1 VISITOR  MAY VISIT WITH YOU AFTER SURGERY IN YOUR PRIVATE ROOM DURING VISITING HOURS ONLY!  YOU NEED TO HAVE A COVID 19 TEST ON__11/06/2020 _____ @_______ , THIS TEST MUST BE DONE BEFORE SURGERY,  COVID TESTING SITE [address] [place] [address], IT IS ON THE RIGHT GOING OUT [address] APPROXIMATELY  2 MINUTES PAST [employer] ON THE RIGHT. ONCE YOUR COVID TEST IS COMPLETED,  PLEASE BEGIN THE QUARANTINE INSTRUCTIONS AS OUTLINED IN YOUR HANDOUT.                [name]  09/26/2020   Your procedure is scheduled on: 10/03/2020    Report to [facility] Main  Entrance   Report to admitting at     0700 AM     Call this number if you have problems the morning of surgery [phone]    Remember: Do not eat food , candy gum or mints :After Midnight. You may have clear liquids from midnight until 0630am    CLEAR LIQUID DIET   Foods Allowed                                                                       Coffee and tea, regular and decaf                              Plain Jell-O any favor except red or purple                                            Fruit ices (not with fruit pulp)                                      Iced Popsicles                                     Carbonated beverages, regular and diet                                    Cranberry, grape and apple juices Sports drinks like Gatorade Lightly seasoned clear broth or consume(fat free) Sugar, honey syrup   _____________________________________________________________________    BRUSH YOUR TEETH MORNING OF SURGERY AND RINSE YOUR MOUTH OUT, NO CHEWING GUM CANDY OR MINTS.     Take these medicines the morning of surgery with A SIP OF WATER:  Verapamil, aciphex, inhalers as usual and bring, wellbutrin, lexapro, gabapentin, synthroid,  salagen                                 You may not have any metal on  your body including hair pins and              piercings  Do not wear jewelry, make-up, lotions, powders or perfumes, deodorant             Do not wear nail polish on your fingernails.  Do not shave  48 hours prior to surgery.              Men may shave face and neck.   Do not bring valuables to the hospital. [place].  Contacts, dentures or bridgework may not be worn into surgery.  Leave suitcase in the car. After surgery it may be brought to your room.     Patients discharged the day of surgery will not be allowed to drive home. IF YOU ARE HAVING SURGERY AND GOING HOME THE SAME DAY, YOU MUST HAVE AN ADULT TO DRIVE YOU HOME AND BE WITH YOU FOR 24 HOURS. YOU MAY GO HOME BY TAXI OR UBER OR ORTHERWISE, BUT AN ADULT MUST ACCOMPANY YOU HOME AND STAY WITH YOU FOR 24 HOURS.  Name and phone number of your driver:  Special Instructions: N/A              Please read over the following fact sheets you were given: _____________________________________________________________________  [facility] - Preparing for Surgery Before surgery, you can play an important role.  Because skin is not sterile, your skin needs to be as free of germs as possible.  You can reduce the number of germs on your skin by washing with CHG (chlorahexidine gluconate) soap before surgery.  CHG is an antiseptic cleaner which kills germs and bonds with the skin to continue killing germs even after washing. Please DO NOT use if you have an allergy to CHG or antibacterial soaps.  If your skin becomes reddened/irritated stop using the CHG and inform your nurse when you arrive at Short Stay. Do not shave (including legs and underarms) for at least 48 hours prior to the first CHG shower.  You may shave your face/neck. Please follow these instructions carefully:  1.  Shower with CHG Soap the night before surgery and  the  morning of Surgery.  2.  If you choose to wash your hair, wash your hair first as usual with your  normal  shampoo.  3.  After you shampoo, rinse your hair and body thoroughly to remove the  shampoo.                           4.  Use CHG as you would any other liquid soap.  You can apply chg directly  to the skin and wash                       Gently with a scrungie or clean washcloth.  5.  Apply the CHG Soap to your body ONLY FROM THE NECK DOWN.   Do not use on face/ open                           Wound or open sores. Avoid contact with eyes, ears mouth and genitals (private parts).                       Wash face,  Genitals (  private parts) with your normal soap.             6.  Wash thoroughly, paying special attention to the area where your surgery  will be performed.  7.  Thoroughly rinse your body with warm water from the neck down.  8.  DO NOT shower/wash with your normal soap after using and rinsing off  the CHG Soap.                9.  Pat yourself dry with a clean towel.            10.  Wear clean pajamas.            11.  Place clean sheets on your bed the night of your first shower and do not  sleep with pets. Day of Surgery : Do not apply any lotions/deodorants the morning of surgery.  Please wear clean clothes to the hospital/surgery center.  FAILURE TO FOLLOW THESE INSTRUCTIONS MAY RESULT IN THE CANCELLATION OF YOUR SURGERY PATIENT SIGNATURE_________________________________  NURSE SIGNATURE__________________________________  ________________________________________________________________________

## 2020-09-27 ENCOUNTER — Other Ambulatory Visit: Payer: Self-pay

## 2020-09-27 ENCOUNTER — Telehealth: Payer: Self-pay

## 2020-09-27 ENCOUNTER — Encounter (HOSPITAL_COMMUNITY): Payer: Self-pay

## 2020-09-27 ENCOUNTER — Encounter (HOSPITAL_COMMUNITY)
Admission: RE | Admit: 2020-09-27 | Discharge: 2020-09-27 | Disposition: A | Discharge status: Home / Self Care | Payer: Medicare Other | Source: Ambulatory Visit | Attending: Orthopaedic Surgery | Admitting: Orthopaedic Surgery

## 2020-09-27 DIAGNOSIS — Z01818 Encounter for other preprocedural examination: Secondary | ICD-10-CM | POA: Diagnosis present

## 2020-09-27 HISTORY — DX: Malignant (primary) neoplasm, unspecified: C80.1

## 2020-09-27 LAB — BASIC METABOLIC PANEL
Anion gap: 10 (ref 5–15)
BUN: 11 mg/dL (ref 8–23)
CO2: 29 mmol/L (ref 22–32)
Calcium: 8.5 mg/dL — ABNORMAL LOW (ref 8.9–10.3)
Chloride: 99 mmol/L (ref 98–111)
Creatinine, Ser: 0.84 mg/dL (ref 0.44–1.00)
GFR, Estimated: >60 mL/min (ref >60)
Glucose, Bld: 85 mg/dL (ref 70–99)
Potassium: 4.1 mmol/L (ref 3.5–5.1)
Sodium: 138 mmol/L (ref 135–145)

## 2020-09-27 LAB — CBC
HCT: 44.3 % (ref 36.0–46.0)
Hemoglobin: 14.2 g/dL (ref 12.0–15.0)
MCH: 30.8 pg (ref 26.0–34.0)
MCHC: 32.1 g/dL (ref 30.0–36.0)
MCV: 96.1 fL (ref 80.0–100.0)
Platelets: 207 10*3/uL (ref 150–400)
RBC: 4.61 MIL/uL (ref 3.87–5.11)
RDW: 13.4 % (ref 11.5–15.5)
WBC: 9.7 10*3/uL (ref 4.0–10.5)
nRBC: 0 % (ref 0.0–0.2)

## 2020-09-27 LAB — SURGICAL PCR SCREEN
MRSA, PCR: NEGATIVE
Staphylococcus aureus: NEGATIVE

## 2020-09-27 NOTE — Telephone Encounter (Signed)
I think she should still go.  We will try to see what we can do to manage her pain as much as we can.

## 2020-09-27 NOTE — Telephone Encounter (Signed)
Danne [place], PA at the pain clinic called stating that they will be taking care of postop pain care for patient.  Cb# [phone].  Please advise.  Thank you.

## 2020-09-27 NOTE — Progress Notes (Addendum)
Anesthesia Review:  PCP:  DR Kelton Pillar  Cardiologist :none  Chest x-ray : EKG :09/27/2020  Echo : Stress test: Cardiac Cath :  Activity level: can do a flight of stairs without difficulty  Sleep Study/ CPAP  None  Fasting Blood Sugar :      / Checks Blood Sugar -- times a day:   Blood Thinner/ Instructions /Last Dose: ASA / Instructions/ Last Dose :  Oxygen 2L at hs  Uses fentanyl patch - per pt  ot is to take off on 09/30/2020- pt followed by pain medicine

## 2020-09-27 NOTE — Telephone Encounter (Signed)
Patient called in saying she spoke with Dr Ninfa Linden and he said he is unsure if he can maintain the leveral post op since she is on strong opioids and that deb stola ( triage nurse) said she was going to call him. Patient wants to know should she still go to her pre auth visit today at [place] to know asap because she lives in [place] .

## 2020-09-27 NOTE — Telephone Encounter (Signed)
Pt informed and stated understanding

## 2020-09-30 ENCOUNTER — Other Ambulatory Visit: Payer: Self-pay | Admitting: Physician Assistant

## 2020-09-30 ENCOUNTER — Other Ambulatory Visit (HOSPITAL_COMMUNITY)
Admission: RE | Admit: 2020-09-30 | Discharge: 2020-09-30 | Disposition: A | Discharge status: Home / Self Care | Payer: Medicare Other | Source: Ambulatory Visit | Attending: Orthopaedic Surgery | Admitting: Orthopaedic Surgery

## 2020-09-30 DIAGNOSIS — Z01812 Encounter for preprocedural laboratory examination: Secondary | ICD-10-CM | POA: Insufficient documentation

## 2020-09-30 DIAGNOSIS — Z20822 Contact with and (suspected) exposure to covid-19: Secondary | ICD-10-CM | POA: Insufficient documentation

## 2020-09-30 LAB — SARS CORONAVIRUS 2 (TAT 6-24 HRS): SARS Coronavirus 2: NEGATIVE

## 2020-10-02 ENCOUNTER — Encounter (HOSPITAL_COMMUNITY): Payer: Self-pay | Admitting: Orthopaedic Surgery

## 2020-10-02 NOTE — Anesthesia Preprocedure Evaluation (Addendum)
Anesthesia Evaluation  Patient identified by MRN, date of birth, ID band Patient awake    Reviewed: Allergy & Precautions, NPO status , Patient's Chart, lab work & pertinent test results  History of Anesthesia Complications Negative for: history of anesthetic complications  Airway Mallampati: I  TM Distance: >3 FB     Dental  (+) Edentulous Upper, Edentulous Lower, Dental Advisory Given   Pulmonary asthma , COPD, Current SmokerPatient did not abstain from smoking.,    Pulmonary exam normal        Cardiovascular negative cardio ROS Normal cardiovascular exam     Neuro/Psych PSYCHIATRIC DISORDERS Anxiety Depression negative neurological ROS     GI/Hepatic Neg liver ROS, GERD  ,  Endo/Other  Hypothyroidism   Renal/GU negative Renal ROS     Musculoskeletal  (+) Arthritis , Fibromyalgia -, narcotic dependent  Abdominal   Peds  Hematology negative hematology ROS (+)   Anesthesia Other Findings   Reproductive/Obstetrics                            Anesthesia Physical Anesthesia Plan  ASA: III  Anesthesia Plan: Spinal   Post-op Pain Management:    Induction: Intravenous  PONV Risk Score and Plan: 2 and Ondansetron, Propofol infusion and Scopolamine patch - Pre-op  Airway Management Planned: Natural Airway  Additional Equipment:   Intra-op Plan:   Post-operative Plan:   Informed Consent: I have reviewed the patients History and Physical, chart, labs and discussed the procedure including the risks, benefits and alternatives for the proposed anesthesia with the patient or authorized representative who has indicated his/her understanding and acceptance.     Dental advisory given  Plan Discussed with: Anesthesiologist and CRNA  Anesthesia Plan Comments:        Anesthesia Quick Evaluation

## 2020-10-03 ENCOUNTER — Encounter (HOSPITAL_COMMUNITY)
Admission: RE | Disposition: A | Discharge status: Home Health | Payer: Self-pay | Source: Home / Self Care | Attending: Orthopaedic Surgery

## 2020-10-03 ENCOUNTER — Observation Stay (HOSPITAL_COMMUNITY): Payer: Medicare Other

## 2020-10-03 ENCOUNTER — Inpatient Hospital Stay (HOSPITAL_COMMUNITY)
Admission: RE | Admit: 2020-10-03 | Discharge: 2020-10-05 | DRG: 470 | Disposition: A | Discharge status: Home Health | Payer: Medicare Other | Attending: Orthopaedic Surgery | Admitting: Orthopaedic Surgery

## 2020-10-03 ENCOUNTER — Other Ambulatory Visit: Payer: Self-pay

## 2020-10-03 ENCOUNTER — Ambulatory Visit (HOSPITAL_COMMUNITY): Payer: Medicare Other | Admitting: Physician Assistant

## 2020-10-03 ENCOUNTER — Encounter (HOSPITAL_COMMUNITY): Payer: Self-pay | Admitting: Orthopaedic Surgery

## 2020-10-03 DIAGNOSIS — D62 Acute posthemorrhagic anemia: Secondary | ICD-10-CM | POA: Diagnosis not present

## 2020-10-03 DIAGNOSIS — Z882 Allergy status to sulfonamides status: Secondary | ICD-10-CM

## 2020-10-03 DIAGNOSIS — E039 Hypothyroidism, unspecified: Secondary | ICD-10-CM | POA: Diagnosis present

## 2020-10-03 DIAGNOSIS — K219 Gastro-esophageal reflux disease without esophagitis: Secondary | ICD-10-CM | POA: Diagnosis present

## 2020-10-03 DIAGNOSIS — F419 Anxiety disorder, unspecified: Secondary | ICD-10-CM | POA: Diagnosis present

## 2020-10-03 DIAGNOSIS — Z86718 Personal history of other venous thrombosis and embolism: Secondary | ICD-10-CM

## 2020-10-03 DIAGNOSIS — Z85850 Personal history of malignant neoplasm of thyroid: Secondary | ICD-10-CM

## 2020-10-03 DIAGNOSIS — Z885 Allergy status to narcotic agent status: Secondary | ICD-10-CM

## 2020-10-03 DIAGNOSIS — Z888 Allergy status to other drugs, medicaments and biological substances status: Secondary | ICD-10-CM

## 2020-10-03 DIAGNOSIS — M1712 Unilateral primary osteoarthritis, left knee: Secondary | ICD-10-CM | POA: Diagnosis not present

## 2020-10-03 DIAGNOSIS — Z90710 Acquired absence of both cervix and uterus: Secondary | ICD-10-CM

## 2020-10-03 DIAGNOSIS — G8929 Other chronic pain: Secondary | ICD-10-CM | POA: Diagnosis present

## 2020-10-03 DIAGNOSIS — F17210 Nicotine dependence, cigarettes, uncomplicated: Secondary | ICD-10-CM | POA: Diagnosis present

## 2020-10-03 DIAGNOSIS — Z96652 Presence of left artificial knee joint: Secondary | ICD-10-CM

## 2020-10-03 DIAGNOSIS — Z96651 Presence of right artificial knee joint: Secondary | ICD-10-CM | POA: Diagnosis present

## 2020-10-03 DIAGNOSIS — J449 Chronic obstructive pulmonary disease, unspecified: Secondary | ICD-10-CM | POA: Diagnosis present

## 2020-10-03 DIAGNOSIS — Z90722 Acquired absence of ovaries, bilateral: Secondary | ICD-10-CM

## 2020-10-03 DIAGNOSIS — Z9981 Dependence on supplemental oxygen: Secondary | ICD-10-CM

## 2020-10-03 DIAGNOSIS — Z881 Allergy status to other antibiotic agents status: Secondary | ICD-10-CM

## 2020-10-03 DIAGNOSIS — Z880 Allergy status to penicillin: Secondary | ICD-10-CM

## 2020-10-03 DIAGNOSIS — F32A Depression, unspecified: Secondary | ICD-10-CM | POA: Diagnosis present

## 2020-10-03 DIAGNOSIS — E785 Hyperlipidemia, unspecified: Secondary | ICD-10-CM | POA: Diagnosis present

## 2020-10-03 DIAGNOSIS — M797 Fibromyalgia: Secondary | ICD-10-CM | POA: Diagnosis present

## 2020-10-03 HISTORY — PX: TOTAL KNEE ARTHROPLASTY: SHX125

## 2020-10-03 LAB — TYPE AND SCREEN
ABO/RH(D): O NEG
Antibody Screen: NEGATIVE

## 2020-10-03 LAB — ABO/RH: ABO/RH(D): O NEG

## 2020-10-03 SURGERY — ARTHROPLASTY, KNEE, TOTAL
Anesthesia: Spinal | Site: Knee | Laterality: Left

## 2020-10-03 MED ORDER — FLUTICASONE FUROATE-VILANTEROL 100-25 MCG/INH IN AEPB
1.0000 | INHALATION_SPRAY | Freq: Every day | RESPIRATORY_TRACT | Status: DC
  Filled 2020-10-03: qty 28

## 2020-10-03 MED ORDER — EPHEDRINE SULFATE-NACL 50-0.9 MG/10ML-% IV SOSY
PREFILLED_SYRINGE | INTRAVENOUS | Status: DC | PRN
  Administered 2020-10-03 (×3): 5 mg via INTRAVENOUS
  Administered 2020-10-03: 10 mg via INTRAVENOUS
  Administered 2020-10-03: 5 mg via INTRAVENOUS

## 2020-10-03 MED ORDER — METOCLOPRAMIDE HCL 5 MG/ML IJ SOLN: 5.0000 mg | Freq: Three times a day (TID) | INTRAMUSCULAR | Status: DC | PRN

## 2020-10-03 MED ORDER — CLINDAMYCIN PHOSPHATE 900 MG/50ML IV SOLN
900.0000 mg | INTRAVENOUS | Status: AC
  Administered 2020-10-03: 900 mg via INTRAVENOUS
  Filled 2020-10-03: qty 50

## 2020-10-03 MED ORDER — STERILE WATER FOR IRRIGATION IR SOLN
Status: DC | PRN
  Administered 2020-10-03: 2000 mL

## 2020-10-03 MED ORDER — OXYCODONE HCL 5 MG PO TABS
5.0000 mg | ORAL_TABLET | ORAL | Status: DC | PRN
  Administered 2020-10-03: 10 mg via ORAL

## 2020-10-03 MED ORDER — CLINDAMYCIN PHOSPHATE 600 MG/50ML IV SOLN
600.0000 mg | Freq: Four times a day (QID) | INTRAVENOUS | Status: AC
  Administered 2020-10-03 (×2): 600 mg via INTRAVENOUS
  Filled 2020-10-03 (×2): qty 50

## 2020-10-03 MED ORDER — ASCORBIC ACID 500 MG PO TABS
1000.0000 mg | ORAL_TABLET | Freq: Two times a day (BID) | ORAL | Status: DC
  Administered 2020-10-04 – 2020-10-05 (×3): 1000 mg via ORAL
  Filled 2020-10-03 (×4): qty 2

## 2020-10-03 MED ORDER — METOCLOPRAMIDE HCL 5 MG PO TABS: 5.0000 mg | ORAL_TABLET | Freq: Three times a day (TID) | ORAL | Status: DC | PRN

## 2020-10-03 MED ORDER — BUPIVACAINE-EPINEPHRINE 0.25% -1:200000 IJ SOLN
INTRAMUSCULAR | Status: DC | PRN
  Administered 2020-10-03: 50 mL

## 2020-10-03 MED ORDER — LACTATED RINGERS IV SOLN: INTRAVENOUS | Status: DC

## 2020-10-03 MED ORDER — LIDOCAINE 2% (20 MG/ML) 5 ML SYRINGE
INTRAMUSCULAR | Status: AC
  Filled 2020-10-03: qty 15

## 2020-10-03 MED ORDER — BUPIVACAINE IN DEXTROSE 0.75-8.25 % IT SOLN
INTRATHECAL | Status: DC | PRN
  Administered 2020-10-03: 1.8 mL via INTRATHECAL

## 2020-10-03 MED ORDER — B COMPLEX-C PO TABS
1.0000 | ORAL_TABLET | Freq: Every day | ORAL | Status: DC
  Administered 2020-10-04 – 2020-10-05 (×2): 1 via ORAL
  Filled 2020-10-03 (×2): qty 1

## 2020-10-03 MED ORDER — MENTHOL 3 MG MT LOZG: 1.0000 | LOZENGE | OROMUCOSAL | Status: DC | PRN

## 2020-10-03 MED ORDER — DIPHENHYDRAMINE HCL 12.5 MG/5ML PO ELIX: 12.5000 mg | ORAL_SOLUTION | ORAL | Status: DC | PRN

## 2020-10-03 MED ORDER — LEVOTHYROXINE SODIUM 25 MCG PO TABS
25.0000 ug | ORAL_TABLET | Freq: Every day | ORAL | Status: DC
  Administered 2020-10-04 – 2020-10-05 (×2): 25 ug via ORAL
  Filled 2020-10-03 (×2): qty 1

## 2020-10-03 MED ORDER — SODIUM CHLORIDE 0.9 % IV SOLN: INTRAVENOUS | Status: DC

## 2020-10-03 MED ORDER — FENTANYL CITRATE (PF) 100 MCG/2ML IJ SOLN: 25.0000 ug | INTRAMUSCULAR | Status: DC | PRN

## 2020-10-03 MED ORDER — VERAPAMIL HCL 40 MG PO TABS
40.0000 mg | ORAL_TABLET | Freq: Two times a day (BID) | ORAL | Status: DC
  Administered 2020-10-03 – 2020-10-05 (×4): 40 mg via ORAL
  Filled 2020-10-03 (×4): qty 1

## 2020-10-03 MED ORDER — TOBRAMYCIN-DEXAMETHASONE 0.3-0.1 % OP SUSP
1.0000 [drp] | Freq: Every day | OPHTHALMIC | Status: DC
  Administered 2020-10-03 – 2020-10-04 (×2): 1 [drp] via OPHTHALMIC
  Filled 2020-10-03: qty 2.5

## 2020-10-03 MED ORDER — ROPIVACAINE HCL 7.5 MG/ML IJ SOLN
INTRAMUSCULAR | Status: DC | PRN
  Administered 2020-10-03: 20 mL via PERINEURAL

## 2020-10-03 MED ORDER — MIDAZOLAM HCL 2 MG/2ML IJ SOLN
INTRAMUSCULAR | Status: AC
  Filled 2020-10-03: qty 2

## 2020-10-03 MED ORDER — OXYCODONE HCL 5 MG PO TABS
10.0000 mg | ORAL_TABLET | ORAL | Status: DC | PRN
  Administered 2020-10-03 – 2020-10-05 (×8): 15 mg via ORAL
  Filled 2020-10-03 (×4): qty 3
  Filled 2020-10-03: qty 2
  Filled 2020-10-03 (×5): qty 3

## 2020-10-03 MED ORDER — POLYETHYLENE GLYCOL 3350 17 G PO PACK: 17.0000 g | PACK | Freq: Every day | ORAL | Status: DC | PRN

## 2020-10-03 MED ORDER — ONDANSETRON HCL 4 MG PO TABS: 4.0000 mg | ORAL_TABLET | Freq: Four times a day (QID) | ORAL | Status: DC | PRN

## 2020-10-03 MED ORDER — ESCITALOPRAM OXALATE 20 MG PO TABS
20.0000 mg | ORAL_TABLET | Freq: Every morning | ORAL | Status: DC
  Administered 2020-10-04 – 2020-10-05 (×2): 20 mg via ORAL
  Filled 2020-10-03 (×2): qty 1

## 2020-10-03 MED ORDER — KETOROLAC TROMETHAMINE 15 MG/ML IJ SOLN
15.0000 mg | Freq: Four times a day (QID) | INTRAMUSCULAR | Status: AC
  Administered 2020-10-03 – 2020-10-04 (×5): 15 mg via INTRAVENOUS
  Filled 2020-10-03 (×5): qty 1

## 2020-10-03 MED ORDER — DEXAMETHASONE SODIUM PHOSPHATE 10 MG/ML IJ SOLN
INTRAMUSCULAR | Status: DC | PRN
  Administered 2020-10-03: 4 mg via INTRAVENOUS

## 2020-10-03 MED ORDER — POVIDONE-IODINE 10 % EX SWAB
2.0000 "application " | Freq: Once | CUTANEOUS | Status: AC
  Administered 2020-10-03: 2 via TOPICAL

## 2020-10-03 MED ORDER — ORAL CARE MOUTH RINSE: 15.0000 mL | Freq: Once | OROMUCOSAL | Status: AC

## 2020-10-03 MED ORDER — PANTOPRAZOLE SODIUM 40 MG PO TBEC
40.0000 mg | DELAYED_RELEASE_TABLET | Freq: Every day | ORAL | Status: DC
  Administered 2020-10-03 – 2020-10-05 (×3): 40 mg via ORAL
  Filled 2020-10-03 (×3): qty 1

## 2020-10-03 MED ORDER — PROMETHAZINE HCL 25 MG/ML IJ SOLN: 6.2500 mg | INTRAMUSCULAR | Status: DC | PRN

## 2020-10-03 MED ORDER — UMECLIDINIUM BROMIDE 62.5 MCG/INH IN AEPB
1.0000 | INHALATION_SPRAY | Freq: Every day | RESPIRATORY_TRACT | Status: DC
  Filled 2020-10-03: qty 7

## 2020-10-03 MED ORDER — CHLORHEXIDINE GLUCONATE 0.12 % MT SOLN
15.0000 mL | Freq: Once | OROMUCOSAL | Status: AC
  Administered 2020-10-03: 15 mL via OROMUCOSAL

## 2020-10-03 MED ORDER — ASPIRIN EC 325 MG PO TBEC
325.0000 mg | DELAYED_RELEASE_TABLET | Freq: Two times a day (BID) | ORAL | Status: DC
  Administered 2020-10-03 – 2020-10-05 (×4): 325 mg via ORAL
  Filled 2020-10-03 (×4): qty 1

## 2020-10-03 MED ORDER — LIDOCAINE 2% (20 MG/ML) 5 ML SYRINGE
INTRAMUSCULAR | Status: DC | PRN
  Administered 2020-10-03: 50 mg via INTRAVENOUS

## 2020-10-03 MED ORDER — PROPOFOL 500 MG/50ML IV EMUL
INTRAVENOUS | Status: DC | PRN
  Administered 2020-10-03: 110 ug/kg/min via INTRAVENOUS

## 2020-10-03 MED ORDER — METHOCARBAMOL 1000 MG/10ML IJ SOLN
500.0000 mg | Freq: Four times a day (QID) | INTRAVENOUS | Status: DC | PRN
  Filled 2020-10-03: qty 5

## 2020-10-03 MED ORDER — ZOLPIDEM TARTRATE 5 MG PO TABS
5.0000 mg | ORAL_TABLET | Freq: Every evening | ORAL | Status: DC | PRN
  Administered 2020-10-03 – 2020-10-04 (×2): 5 mg via ORAL
  Filled 2020-10-03 (×2): qty 1

## 2020-10-03 MED ORDER — GLYCOPYRROLATE 1 MG PO TABS
1.0000 mg | ORAL_TABLET | Freq: Every day | ORAL | Status: DC
  Administered 2020-10-04: 1 mg via ORAL
  Filled 2020-10-03 (×2): qty 1

## 2020-10-03 MED ORDER — HYDROMORPHONE HCL 1 MG/ML IJ SOLN
1.0000 mg | INTRAMUSCULAR | Status: DC | PRN
  Administered 2020-10-04 (×5): 2 mg via INTRAVENOUS
  Filled 2020-10-03 (×5): qty 2

## 2020-10-03 MED ORDER — MIDAZOLAM HCL 2 MG/2ML IJ SOLN
1.0000 mg | INTRAMUSCULAR | Status: DC
  Administered 2020-10-03 (×2): 2 mg via INTRAVENOUS
  Filled 2020-10-03: qty 2

## 2020-10-03 MED ORDER — BUPROPION HCL ER (SR) 150 MG PO TB12
450.0000 mg | ORAL_TABLET | Freq: Every day | ORAL | Status: DC
  Administered 2020-10-04 – 2020-10-05 (×2): 450 mg via ORAL
  Filled 2020-10-03 (×2): qty 3

## 2020-10-03 MED ORDER — ATORVASTATIN CALCIUM 10 MG PO TABS
10.0000 mg | ORAL_TABLET | Freq: Every day | ORAL | Status: DC
  Administered 2020-10-04 – 2020-10-05 (×2): 10 mg via ORAL
  Filled 2020-10-03 (×2): qty 1

## 2020-10-03 MED ORDER — ALUM & MAG HYDROXIDE-SIMETH 200-200-20 MG/5ML PO SUSP: 30.0000 mL | ORAL | Status: DC | PRN

## 2020-10-03 MED ORDER — PHENYLEPHRINE HCL-NACL 10-0.9 MG/250ML-% IV SOLN
INTRAVENOUS | Status: DC | PRN
  Administered 2020-10-03: 25 ug/min via INTRAVENOUS

## 2020-10-03 MED ORDER — ONDANSETRON HCL 4 MG/2ML IJ SOLN: 4.0000 mg | Freq: Four times a day (QID) | INTRAMUSCULAR | Status: DC | PRN

## 2020-10-03 MED ORDER — CLONAZEPAM 0.125 MG PO TBDP
0.2500 mg | ORAL_TABLET | Freq: Three times a day (TID) | ORAL | Status: DC | PRN
  Administered 2020-10-03 – 2020-10-04 (×2): 0.5 mg via ORAL
  Administered 2020-10-04: 0.25 mg via ORAL
  Filled 2020-10-03: qty 4
  Filled 2020-10-03 (×2): qty 2
  Filled 2020-10-03: qty 4

## 2020-10-03 MED ORDER — ONDANSETRON HCL 4 MG/2ML IJ SOLN
INTRAMUSCULAR | Status: DC | PRN
  Administered 2020-10-03: 4 mg via INTRAVENOUS

## 2020-10-03 MED ORDER — HYDROMORPHONE HCL 2 MG PO TABS
4.0000 mg | ORAL_TABLET | ORAL | Status: DC | PRN
  Administered 2020-10-03 – 2020-10-05 (×4): 4 mg via ORAL
  Filled 2020-10-03 (×4): qty 2

## 2020-10-03 MED ORDER — PHENOL 1.4 % MT LIQD: 1.0000 | OROMUCOSAL | Status: DC | PRN

## 2020-10-03 MED ORDER — ACETAMINOPHEN 325 MG PO TABS
325.0000 mg | ORAL_TABLET | Freq: Four times a day (QID) | ORAL | Status: DC | PRN
  Administered 2020-10-05: 650 mg via ORAL
  Filled 2020-10-03: qty 2

## 2020-10-03 MED ORDER — BUPIVACAINE-EPINEPHRINE 0.5% -1:200000 IJ SOLN
INTRAMUSCULAR | Status: AC
  Filled 2020-10-03: qty 1

## 2020-10-03 MED ORDER — PROPOFOL 10 MG/ML IV BOLUS
INTRAVENOUS | Status: DC | PRN
  Administered 2020-10-03: 20 mg via INTRAVENOUS

## 2020-10-03 MED ORDER — FLUTICASONE PROPIONATE 50 MCG/ACT NA SUSP
1.0000 | Freq: Every day | NASAL | Status: DC | PRN
  Filled 2020-10-03: qty 16

## 2020-10-03 MED ORDER — 0.9 % SODIUM CHLORIDE (POUR BTL) OPTIME
TOPICAL | Status: DC | PRN
  Administered 2020-10-03: 1000 mL

## 2020-10-03 MED ORDER — FLUTICASONE-UMECLIDIN-VILANT 100-62.5-25 MCG/INH IN AEPB: 1.0000 | INHALATION_SPRAY | Freq: Every day | RESPIRATORY_TRACT | Status: DC

## 2020-10-03 MED ORDER — FENTANYL CITRATE (PF) 100 MCG/2ML IJ SOLN
50.0000 ug | INTRAMUSCULAR | Status: DC
  Administered 2020-10-03: 100 ug via INTRAVENOUS
  Filled 2020-10-03: qty 2

## 2020-10-03 MED ORDER — ESTRADIOL 1 MG PO TABS
1.0000 mg | ORAL_TABLET | Freq: Every day | ORAL | Status: DC
  Administered 2020-10-04 – 2020-10-05 (×2): 1 mg via ORAL
  Filled 2020-10-03 (×3): qty 1

## 2020-10-03 MED ORDER — GABAPENTIN 300 MG PO CAPS
300.0000 mg | ORAL_CAPSULE | Freq: Two times a day (BID) | ORAL | Status: DC
  Administered 2020-10-03 – 2020-10-05 (×4): 300 mg via ORAL
  Filled 2020-10-03 (×4): qty 1

## 2020-10-03 MED ORDER — ALBUTEROL SULFATE HFA 108 (90 BASE) MCG/ACT IN AERS: 2.0000 | INHALATION_SPRAY | Freq: Four times a day (QID) | RESPIRATORY_TRACT | Status: DC | PRN

## 2020-10-03 MED ORDER — BUTALBITAL-APAP-CAFFEINE 50-325-40 MG PO TABS: 1.0000 | ORAL_TABLET | Freq: Four times a day (QID) | ORAL | Status: DC | PRN

## 2020-10-03 MED ORDER — CELECOXIB 200 MG PO CAPS
200.0000 mg | ORAL_CAPSULE | Freq: Once | ORAL | Status: AC
  Administered 2020-10-03: 200 mg via ORAL
  Filled 2020-10-03: qty 1

## 2020-10-03 MED ORDER — ROPINIROLE HCL 1 MG PO TABS
4.0000 mg | ORAL_TABLET | Freq: Every day | ORAL | Status: DC
  Administered 2020-10-03 – 2020-10-04 (×2): 4 mg via ORAL
  Filled 2020-10-03 (×2): qty 4

## 2020-10-03 MED ORDER — DOCUSATE SODIUM 100 MG PO CAPS
100.0000 mg | ORAL_CAPSULE | Freq: Two times a day (BID) | ORAL | Status: DC
  Administered 2020-10-04 – 2020-10-05 (×3): 100 mg via ORAL
  Filled 2020-10-03 (×4): qty 1

## 2020-10-03 MED ORDER — TRANEXAMIC ACID-NACL 1000-0.7 MG/100ML-% IV SOLN
1000.0000 mg | INTRAVENOUS | Status: AC
  Administered 2020-10-03: 1000 mg via INTRAVENOUS
  Filled 2020-10-03: qty 100

## 2020-10-03 MED ORDER — FENTANYL 50 MCG/HR TD PT72
1.0000 | MEDICATED_PATCH | TRANSDERMAL | Status: DC
  Administered 2020-10-04: 1 via TRANSDERMAL
  Filled 2020-10-03: qty 1

## 2020-10-03 MED ORDER — METHOCARBAMOL 500 MG PO TABS
500.0000 mg | ORAL_TABLET | Freq: Four times a day (QID) | ORAL | Status: DC | PRN
  Administered 2020-10-03 – 2020-10-05 (×6): 500 mg via ORAL
  Filled 2020-10-03 (×6): qty 1

## 2020-10-03 MED ORDER — SODIUM CHLORIDE 0.9 % IR SOLN
Status: DC | PRN
  Administered 2020-10-03: 1000 mL

## 2020-10-03 MED ORDER — PILOCARPINE HCL 5 MG PO TABS
5.0000 mg | ORAL_TABLET | Freq: Two times a day (BID) | ORAL | Status: DC
  Administered 2020-10-03 – 2020-10-05 (×4): 5 mg via ORAL
  Filled 2020-10-03 (×4): qty 1

## 2020-10-03 MED ORDER — ERYTHROMYCIN 5 MG/GM OP OINT
1.0000 "application " | TOPICAL_OINTMENT | Freq: Every day | OPHTHALMIC | Status: DC
  Administered 2020-10-03 – 2020-10-04 (×2): 1 via OPHTHALMIC
  Filled 2020-10-03: qty 1

## 2020-10-03 SURGICAL SUPPLY — 60 items
APL SKNCLS STERI-STRIP NONHPOA (GAUZE/BANDAGES/DRESSINGS)
BAG SPEC THK2 15X12 ZIP CLS (MISCELLANEOUS)
BAG ZIPLOCK 12X15 (MISCELLANEOUS) IMPLANT
BASEPLATE TIBIAL TRIATHALON 3 (Plate) ×2 IMPLANT
BENZOIN TINCTURE PRP APPL 2/3 (GAUZE/BANDAGES/DRESSINGS) IMPLANT
BLADE SAG 18X100X1.27 (BLADE) IMPLANT
BLADE SURG SZ10 CARB STEEL (BLADE) ×6 IMPLANT
BNDG CMPR MED 10X6 ELC LF (GAUZE/BANDAGES/DRESSINGS) ×1
BNDG ELASTIC 6X10 VLCR STRL LF (GAUZE/BANDAGES/DRESSINGS) ×2 IMPLANT
BNDG ELASTIC 6X5.8 VLCR STR LF (GAUZE/BANDAGES/DRESSINGS) ×3 IMPLANT
BOWL SMART MIX CTS (DISPOSABLE) IMPLANT
BSPLAT TIB 3 CMNT PRM STRL KN (Plate) ×1 IMPLANT
CEMENT BONE SIMPLEX SPEEDSET (Cement) ×4 IMPLANT
CLOSURE WOUND 1/2 X4 (GAUZE/BANDAGES/DRESSINGS)
COVER SURGICAL LIGHT HANDLE (MISCELLANEOUS) ×3 IMPLANT
COVER WAND RF STERILE (DRAPES) IMPLANT
CUFF TOURN SGL QUICK 34 (TOURNIQUET CUFF) ×3
CUFF TRNQT CYL 34X4.125X (TOURNIQUET CUFF) ×1 IMPLANT
DECANTER SPIKE VIAL GLASS SM (MISCELLANEOUS) IMPLANT
DRAPE U-SHAPE 47X51 STRL (DRAPES) ×3 IMPLANT
DRSG PAD ABDOMINAL 8X10 ST (GAUZE/BANDAGES/DRESSINGS) ×4 IMPLANT
DURAPREP 26ML APPLICATOR (WOUND CARE) ×3 IMPLANT
ELECT BLADE TIP CTD 4 INCH (ELECTRODE) ×3 IMPLANT
ELECT REM PT RETURN 15FT ADLT (MISCELLANEOUS) ×3 IMPLANT
FEMORAL PEG DISTAL FIXATION (Orthopedic Implant) ×2 IMPLANT
FEMORAL TRIATH POST STAB  SZ3 (Orthopedic Implant) ×3 IMPLANT
FEMORAL TRIATH POST STAB SZ3 (Orthopedic Implant) IMPLANT
GAUZE SPONGE 4X4 12PLY STRL (GAUZE/BANDAGES/DRESSINGS) ×3 IMPLANT
GAUZE XEROFORM 1X8 LF (GAUZE/BANDAGES/DRESSINGS) ×2 IMPLANT
GLOVE BIO SURGEON STRL SZ7.5 (GLOVE) ×3 IMPLANT
GLOVE BIOGEL PI IND STRL 8 (GLOVE) ×2 IMPLANT
GLOVE BIOGEL PI INDICATOR 8 (GLOVE) ×4
GLOVE ECLIPSE 8.0 STRL XLNG CF (GLOVE) ×3 IMPLANT
GOWN STRL REUS W/TWL XL LVL3 (GOWN DISPOSABLE) ×6 IMPLANT
HANDPIECE INTERPULSE COAX TIP (DISPOSABLE) ×3
HOLDER FOLEY CATH W/STRAP (MISCELLANEOUS) IMPLANT
IMMOBILIZER KNEE 20 (SOFTGOODS) ×3
IMMOBILIZER KNEE 20 THIGH 36 (SOFTGOODS) ×1 IMPLANT
INSERT TIBIA BEAR SZ3 9 KNEE (Miscellaneous) ×4 IMPLANT
KIT TURNOVER KIT A (KITS) IMPLANT
NS IRRIG 1000ML POUR BTL (IV SOLUTION) ×3 IMPLANT
PACK TOTAL KNEE CUSTOM (KITS) ×3 IMPLANT
PADDING CAST COTTON 6X4 STRL (CAST SUPPLIES) ×4 IMPLANT
PATELLA TRIATHLON SZ 29 9 MM (Orthopedic Implant) ×2 IMPLANT
PENCIL SMOKE EVACUATOR (MISCELLANEOUS) IMPLANT
PIN FLUTED HEDLESS FIX 3.5X1/8 (PIN) ×2 IMPLANT
PROTECTOR NERVE ULNAR (MISCELLANEOUS) ×3 IMPLANT
SET HNDPC FAN SPRY TIP SCT (DISPOSABLE) ×1 IMPLANT
SET PAD KNEE POSITIONER (MISCELLANEOUS) ×3 IMPLANT
STAPLER VISISTAT 35W (STAPLE) IMPLANT
STRIP CLOSURE SKIN 1/2X4 (GAUZE/BANDAGES/DRESSINGS) IMPLANT
SUT MNCRL AB 4-0 PS2 18 (SUTURE) IMPLANT
SUT VIC AB 0 CT1 27 (SUTURE) ×3
SUT VIC AB 0 CT1 27XBRD ANTBC (SUTURE) ×1 IMPLANT
SUT VIC AB 1 CT1 36 (SUTURE) ×6 IMPLANT
SUT VIC AB 2-0 CT1 27 (SUTURE) ×6
SUT VIC AB 2-0 CT1 TAPERPNT 27 (SUTURE) ×2 IMPLANT
TRAY FOLEY MTR SLVR 16FR STAT (SET/KITS/TRAYS/PACK) ×3 IMPLANT
WATER STERILE IRR 1000ML POUR (IV SOLUTION) ×3 IMPLANT
WRAP KNEE MAXI GEL POST OP (GAUZE/BANDAGES/DRESSINGS) ×2 IMPLANT

## 2020-10-03 NOTE — Brief Op Note (Signed)
10/03/2020  10:49 AM  PATIENT:  Maisie Fus  69 y.o. female  PRE-OPERATIVE DIAGNOSIS:  osteoarthritis left knee  POST-OPERATIVE DIAGNOSIS:  osteoarthritis left knee  PROCEDURE:  Procedure(s): LEFT TOTAL KNEE ARTHROPLASTY (Left)  SURGEON:  Surgeon(s) and Role:    Mcarthur Rossetti, MD - Primary  PHYSICIAN ASSISTANT:  Benita Stabile, PA-C  ANESTHESIA:   local, regional and spinal  EBL:  50 mL   COUNTS:  YES  TOURNIQUET:   Total Tourniquet Time Documented: Thigh (Right) - 49 minutes Total: Thigh (Right) - 49 minutes   DICTATION: .Other Dictation: Dictation Number [id number]  PLAN OF CARE: Admit for overnight observation  PATIENT DISPOSITION:  PACU - hemodynamically stable.   Delay start of Pharmacological VTE agent (>24hrs) due to surgical blood loss or risk of bleeding: no

## 2020-10-03 NOTE — Anesthesia Procedure Notes (Signed)
Anesthesia Regional Block: Adductor canal block   Pre-Anesthetic Checklist: ,, timeout performed, Correct Patient, Correct Site, Correct Laterality, Correct Procedure, Correct Position, site marked, Risks and benefits discussed,  Surgical consent,  Pre-op evaluation,  At surgeon's request and post-op pain management  Laterality: Left  Prep: chloraprep       Needles:  Injection technique: Single-shot  Needle Type: Stimulator Needle - 80     Needle Length: 10cm  Needle Gauge: 21     Additional Needles:   Narrative:  Start time: 10/03/2020 8:18 AM End time: 10/03/2020 8:28 AM Injection made incrementally with aspirations every 5 mL.  Performed by: Personally

## 2020-10-03 NOTE — Progress Notes (Signed)
Patient c/o calf pain in left leg. Attempted to reassure her that it was not abnormal immediately after surgery . Notified Dr. Ninfa Linden.

## 2020-10-03 NOTE — Op Note (Signed)
NAMESHEREL, FENNELL MEDICAL RECORD VQ:[id number] ACCOUNT [id number] DATE OF BIRTH:[birth year] FACILITY: [facility] LOCATION: [facility] PHYSICIAN:Shaylie Eklund Kerry [place], MD  OPERATIVE REPORT  DATE OF PROCEDURE:  10/03/2020  PREOPERATIVE DIAGNOSIS:  Primary osteoarthritis and degenerative joint disease, left knee.  POSTOPERATIVE DIAGNOSIS:  Primary osteoarthritis and degenerative joint disease, left knee.  PROCEDURE:  Left total knee arthroplasty.  IMPLANTS:  Stryker Triathlon cemented knee system with size 3 femur, size 3 tibial tray, 9 mm fixed bearing polyethylene insert, size 29 patellar button.  SURGEON:  Jean Rosenthal, MD  ASSISTANT:  Erskine Emery, PA-C.  ANESTHESIA: 1.  Left lower extremity adductor canal block. 2.  Spinal. 3.  Local arthrotomy injection with 0.5% Marcaine with epinephrine.  TOURNIQUET TIME:  Less than 1 hour.  ANTIBIOTICS:  900 mg IV clindamycin.  ESTIMATED BLOOD LOSS:  Less than 100 mL.  COMPLICATIONS:  None.  INDICATIONS:  The patient is a 69 year old female with debilitating arthritis involving her left knee.  She has tried and failed all forms of conservative treatment and with x-rays and an MRI showing significant cartilage loss in the knee we have  recommended total knee arthroplasty.  She actually has a remote history of a right total knee arthroplasty done elsewhere.  That knee did require manipulation under anesthesia due to her severe pain.  She is someone who is on chronic pain management and  that is my concern.  She was on fentanyl patches and Dilaudid daily and she understands this is going to be very painful surgery and that we may have quite a difficult time getting her under control pain wise.  She still does wish to proceed with  surgery.  We had a long and thorough discussion about the risk of acute blood loss anemia, nerve or vessel injury, fracture, infection, DVT, implant failure and arthrofibrosis.  We talked about our goals of  being hopefully decreased pain, improve  mobility and overall improved quality of life.  DESCRIPTION OF PROCEDURE:  After informed consent was obtained and appropriate left knee was marked.  An adductor canal block was obtained in the holding room of her left lower extremity.  She was then brought to the operating room and sat up on the  operating table where her spinal anesthesia was obtained.  We then laid her in supine position.  A Foley catheter was placed and a nonsterile tourniquet was placed around her upper left thigh.  Her left thigh, knee, leg, ankle and foot were prepped and  draped with DuraPrep and sterile drapes including a sterile stockinette.  Time-out was called.  She was identified, correct patient, correct left knee.  We then used an Esmarch to wrap that leg and tourniquet was inflated to 250 mm of pressure.  We then  made a direct midline incision over the patella and carried this proximally and distally.  We dissected down the knee joint and carried out a medial parapatellar arthrotomy finding a very large joint effusion and significant cartilage wear mainly on the  lateral aspect of her knee.  The bone was quite soft as well.  With the knee in a flexed position, we removed remnants of the ACL, PCL, medial and lateral meniscus.  We used extramedullary cutting guide for making our proximal tibia cuts, correcting for  varus and valgus in the neutral slope and to take 9 mm off the high side.  We made this cut without difficulty.  We then used a intramedullary guide in the femur for our distal femoral cutting block setting  this for a left knee at 5 degrees externally  rotated and for an 8 mm distal femoral cut.  We made this cut without difficulty and brought the knee back down to full extension and with a 9 mm extension block and achieved full extension.  We then went back to the femur and put our femoral sizing  guide based off the epicondylar axis and Whiteside line.  Based off this, we  chose a size 3 femur, we put a 4-in-1 cutting block for a size 3 femur and we made our anterior and posterior cuts, followed by our chamfer cuts.  We then made our femoral box  cut.  Attention was then turned back to the tibia.  We chose a size 3 tibial tray for coverage setting the rotation off the tibial tubercle and the femur.  We made our keel punch off of this.  With a size 3 a trial tibia followed by the 3 left trial  femur, we trialed a 9 mm fixed bearing polyethylene insert and we were pleased with range of motion and stability with this.  We then made our patellar cut and drilled 3 holes for a size 29 patellar button.  We then removed all trial components from the  knee and irrigated the knee with normal saline solution using pulsatile lavage.  We dried the knee real well and then placed our 0.5% Marcaine with epinephrine around the arthrotomy.  We dried the knee again and mixed our cement.  With the knee in a  flexed position, we then cemented our real Stryker Triathlon tibial tray size 3 followed by real size 3 left femur.  Once this cement had hardened, we also cemented our patellar button and placed our 9 mm fixed bearing polyethylene insert.  We held the  knee in a fully extended position while the cement hardened.  I then put the knee through range of motion once it had hardened, we let the tourniquet down.  Hemostasis obtained with electrocautery.  We then closed the arthrotomy with interrupted #1  Vicryl suture followed by 0 Vicryl to close the deep tissue and 2-0 Vicryl to close the subcutaneous tissue.  The skin was reapproximated with staples.  Xeroform well-padded sterile dressing was applied.  The patient was taken to recovery room in stable  condition.  All final counts being correct.  No complications noted.  Of note, Benita Stabile, PA-C, assisted during the entire case and his assistance was crucial for facilitating all aspects of this case.  HN/NUANCE  D:10/03/2020 T:10/03/2020  JOB:[id number]

## 2020-10-03 NOTE — Anesthesia Postprocedure Evaluation (Signed)
Anesthesia Post Note  Patient: [name]  Procedure(s) Performed: LEFT TOTAL KNEE ARTHROPLASTY (Left Knee)     Patient location during evaluation: PACU Anesthesia Type: Spinal Level of consciousness: awake and alert Pain management: pain level controlled Vital Signs Assessment: post-procedure vital signs reviewed and stable Respiratory status: spontaneous breathing and respiratory function stable Cardiovascular status: blood pressure returned to baseline and stable Postop Assessment: spinal receding Anesthetic complications: no   No complications documented.  Last Vitals:  Vitals:   10/03/20 1246 10/03/20 1300  BP: 98/71   Pulse: 71 65  Resp: 17 (!) 35  Temp:    SpO2: 96% 96%    Last Pain:  Vitals:   10/03/20 1246  TempSrc:   PainSc: 0-No pain                 [name] DANIEL

## 2020-10-03 NOTE — Anesthesia Procedure Notes (Signed)
Spinal  Patient location during procedure: OR Start time: 10/03/2020 9:34 AM End time: 10/03/2020 9:24 AM Staffing Performed: anesthesiologist  Anesthesiologist: Duane Boston, MD Preanesthetic Checklist Completed: patient identified, IV checked, risks and benefits discussed, surgical consent, monitors and equipment checked, pre-op evaluation and timeout performed Spinal Block Patient position: sitting Prep: DuraPrep Patient monitoring: cardiac monitor, continuous pulse ox and blood pressure Approach: midline Location: L2-3 Injection technique: single-shot Needle Needle type: Quincke  Needle gauge: 22 G Needle length: 9 cm Additional Notes Functioning IV was confirmed and monitors were applied. Sterile prep and drape, including hand hygiene and sterile gloves were used. The patient was positioned and the spine was prepped. The skin was anesthetized with lidocaine.  Free flow of clear CSF was obtained prior to injecting local anesthetic into the CSF.  The spinal needle aspirated freely following injection.  The needle was carefully withdrawn.  The patient tolerated the procedure well. Difficult placement.

## 2020-10-03 NOTE — Plan of Care (Signed)
  Problem: Education: Goal: Knowledge of General Education information will improve Description: Including pain rating scale, medication(s)/side effects and non-pharmacologic comfort measures Outcome: Progressing   Problem: Health Behavior/Discharge Planning: Goal: Ability to manage health-related needs will improve Outcome: Progressing   Problem: Clinical Measurements: Goal: Ability to maintain clinical measurements within normal limits will improve Outcome: Progressing Goal: Will remain free from infection Outcome: Progressing Goal: Diagnostic test results will improve Outcome: Progressing Goal: Respiratory complications will improve Outcome: Progressing Goal: Cardiovascular complication will be avoided Outcome: Progressing   Problem: Activity: Goal: Risk for activity intolerance will decrease Outcome: Progressing   Problem: Nutrition: Goal: Adequate nutrition will be maintained Outcome: Progressing   Problem: Elimination: Goal: Will not experience complications related to bowel motility Outcome: Progressing Goal: Will not experience complications related to urinary retention Outcome: Progressing   Problem: Pain Managment: Goal: General experience of comfort will improve Outcome: Progressing   Problem: Safety: Goal: Ability to remain free from injury will improve Outcome: Progressing   Problem: Skin Integrity: Goal: Risk for impaired skin integrity will decrease Outcome: Progressing   Problem: Education: Goal: Knowledge of the prescribed therapeutic regimen will improve Outcome: Progressing Goal: Individualized Educational Video(s) Outcome: Progressing   Problem: Activity: Goal: Ability to avoid complications of mobility impairment will improve Outcome: Progressing Goal: Range of joint motion will improve Outcome: Progressing   Problem: Clinical Measurements: Goal: Postoperative complications will be avoided or minimized Outcome: Progressing   Problem:  Pain Management: Goal: Pain level will decrease with appropriate interventions Outcome: Progressing   Problem: Skin Integrity: Goal: Will show signs of wound healing Outcome: Progressing

## 2020-10-03 NOTE — H&P (Signed)
TOTAL KNEE ADMISSION H&P  Patient is being admitted for left total knee arthroplasty.  Subjective:  Chief Complaint:left knee pain.  HPI: [name], 69 y.o. female, has a history of pain and functional disability in the left knee due to arthritis and has failed non-surgical conservative treatments for greater than 12 weeks to includeNSAID's and/or analgesics, corticosteriod injections, viscosupplementation injections, flexibility and strengthening excercises, use of assistive devices and activity modification.  Onset of symptoms was gradual, starting 3 years ago with gradually worsening course since that time. The patient noted no past surgery on the left knee(s).  Patient currently rates pain in the left knee(s) at 10 out of 10 with activity. Patient has night pain, worsening of pain with activity and weight bearing, pain that interferes with activities of daily living, pain with passive range of motion, crepitus and joint swelling.  Patient has evidence of subchondral sclerosis, periarticular osteophytes and joint space narrowing by imaging studies. There is no active infection.  Patient Active Problem List   Diagnosis Date Noted  . Unilateral primary osteoarthritis, left knee 10/03/2020  . Unilateral primary osteoarthritis, left hip 08/22/2020  . Chronic pain of left knee 08/22/2020  . Bilateral elbow joint pain 08/11/2017  . Chronic pain of both shoulders 08/11/2017   Past Medical History:  Diagnosis Date  . Anxiety    states frequent  anxiety attacks  . Asthma   . Cancer (HCC)    HX OF THYROID CANCER   . Cold no drainage   since thursday 07-21-12, chest xray done 07-24-2012   . COPD (chronic obstructive pulmonary disease) ([place])    LOV with PFT 1/13  Dr Linde Gillis on chart-   pt states no changes since last saw pulmonary  . Depression   . Fibromyalgia    states has had trigger point injections shoulders-   . GERD (gastroesophageal reflux disease)   . Headache(784.0)     migraines  . History of DVT of lower extremity 1972   right leg  . Hyperlipidemia   . Hypothyroidism   . OA (osteoarthritis)   . On home oxygen therapy    2 liters per minute per [place] as needed  . Seasonal allergies    sgtates clear nasal drainage x 3- days- states no fever  . Shortness of breath    oxygen at 2 liters at night, WITH EXERTION     Past Surgical History:  Procedure Laterality Date  . ABDOMINAL HYSTERECTOMY  1990   bilateral salpingo-oophorectomy  . [place]  . BOWEL SURGERY FOR OBSTRUCTION  1992  . CLOSED MANIPULATION RIGHT KNEE REPLACEMENT  09-10-2010  . DIAGNOSTIC LAPAROSCOPY  1989   x2-3 with ovarian cystectomy  . FINGER ARTHROPLASTY  07/29/2012   Procedure: FINGER ARTHROPLASTY;  Surgeon: Magnus Sinning, MD;  Location: [facility];  Service: Orthopedics;  Laterality: Left;  Left Thumb, CMC Interpositional Arthroplasty Left Thumb,with Palmaris Longis Tendon Graft, IPSI Lateral     . HEMORRHOID SURGERY  1988  . KNEE ARTHROSCOPY  10-26-2008   AND REMOVAL 2 SCREWS PROXIMAL TIBIA  OF RIGHT KNEE  . KNEE ARTHROTOMY  LAST ONE 2000   right x 3 prior to replacement  . LEFT SHOULDER ARTHROSCOPY  03-02-2012  . THYROID LOBECTOMY  2009   PARTIAL LEFT LOWER LOBECTOMY  . TONSILLECTOMY  1959  . TOTAL KNEE ARTHROPLASTY  07-31-2010   OA  RIGHT KNEE    Current Facility-Administered Medications  Medication Dose  Route Frequency Provider Last Rate Last Admin  . clindamycin (CLEOCIN) IVPB 900 mg  900 mg Intravenous On Call to OR Pete Pelt, PA-C      . fentaNYL (SUBLIMAZE) injection 50-100 mcg  50-100 mcg Intravenous Sherri Rad, MD   100 mcg at 10/03/20 0820  . lactated ringers infusion   Intravenous Continuous Duane Boston, MD 50 mL/hr at 10/03/20 0829 Continued from Pre-op at 10/03/20 0829  . midazolam (VERSED) injection 1-2 mg  1-2 mg Intravenous Sherri Rad, MD   2 mg at 10/03/20 0820  . tranexamic acid  (CYKLOKAPRON) IVPB 1,000 mg  1,000 mg Intravenous To OR Pete Pelt, PA-C       Facility-Administered Medications Ordered in Other Encounters  Medication Dose Route Frequency Provider Last Rate Last Admin  . ropivacaine (PF) 7.5 mg/mL (0.75%) (NAROPIN) injection   Peri-NEURAL Anesthesia Intra-op Duane Boston, MD   20 mL at 10/03/20 4332   Allergies  Allergen Reactions  . Codeine Nausea And Vomiting  . Doxycycline Nausea And Vomiting  . Penicillins Other (See Comments)    Family cannot take / update 03/02/12 - pt. does not remember the reaction.  . Sulfa Antibiotics Nausea And Vomiting  . Tylenol [Acetaminophen] Other (See Comments)    Cannot take with Neurontin / update 03/02/12 - causes diarrhea  . Vistaril [Hydroxyzine Hcl] Other (See Comments)     Hallucinations    Social History   Tobacco Use  . Smoking status: Current Every Day Smoker    Packs/day: 1.00    Years: 43.00    Pack years: 43.00    Types: Cigarettes  . Smokeless tobacco: Never Used  Substance Use Topics  . Alcohol use: No    History reviewed. No pertinent family history.   Review of Systems  Musculoskeletal: Positive for joint swelling.  All other systems reviewed and are negative.   Objective:  Physical Exam Vitals reviewed.  Constitutional:      Appearance: Normal appearance.  HENT:     Head: Normocephalic and atraumatic.  Eyes:     Extraocular Movements: Extraocular movements intact.     Pupils: Pupils are equal, round, and reactive to light.  Cardiovascular:     Rate and Rhythm: Normal rate and regular rhythm.     Pulses: Normal pulses.  Pulmonary:     Effort: Pulmonary effort is normal.     Breath sounds: Normal breath sounds.  Abdominal:     Palpations: Abdomen is soft.  Musculoskeletal:     Cervical back: Normal range of motion and neck supple.     Left knee: Swelling, effusion, bony tenderness and crepitus present. Decreased range of motion. Tenderness present over the medial joint  line, lateral joint line and patellar tendon. Abnormal alignment.  Neurological:     Mental Status: She is alert and oriented to person, place, and time.  Psychiatric:        Behavior: Behavior normal.     Vital signs in last 24 hours: Temp:  [98.1 F (36.7 C)] 98.1 F (36.7 C) (11/11 0726) Pulse Rate:  [67-77] 72 (11/11 0856) Resp:  [9-18] 15 (11/11 0856) BP: (92-105)/(51-70) 95/51 (11/11 0856) SpO2:  [91 %-98 %] 98 % (11/11 0856) Weight:  [51.7 kg] 51.7 kg (11/11 0755)  Labs:   Estimated body mass index is 20.85 kg/m as calculated from the following:   Height as of this encounter: 5\' 2"  (1.575 m).   Weight as of this encounter: 51.7 kg.   Imaging  Review Plain radiographs demonstrate severe degenerative joint disease of the left knee(s). The overall alignment ismild varus. The bone quality appears to be good for age and reported activity level.      Assessment/Plan:  End stage arthritis, left knee   The patient history, physical examination, clinical judgment of the provider and imaging studies are consistent with end stage degenerative joint disease of the left knee(s) and total knee arthroplasty is deemed medically necessary. The treatment options including medical management, injection therapy arthroscopy and arthroplasty were discussed at length. The risks and benefits of total knee arthroplasty were presented and reviewed. The risks due to aseptic loosening, infection, stiffness, patella tracking problems, thromboembolic complications and other imponderables were discussed. The patient acknowledged the explanation, agreed to proceed with the plan and consent was signed. Patient is being admitted for inpatient treatment for surgery, pain control, PT, OT, prophylactic antibiotics, VTE prophylaxis, progressive ambulation and ADL's and discharge planning. The patient is planning to be discharged home with home health services

## 2020-10-03 NOTE — Progress Notes (Addendum)
Physical Therapy Evaluation. Patient Details Name: [name] MRN: [id number] DOB: 1951/07/28 Today's Date: 10/03/2020    History of Present Illness Pt s/p L TKR and with hx of R TKR, COPD and Fibromyalgia    PT Comments    Pt s/p L TKR and presents with decreased L LE strength/ROM and post op pain limiting functional mobility.  Pt should progress to dc home with family assist.   Follow Up Recommendations  Follow surgeon's recommendation for DC plan and follow-up therapies     Equipment Recommendations  Crutches    Recommendations for Other Services       Precautions / Restrictions Precautions Precautions: Knee;Fall Required Braces or Orthoses: Knee Immobilizer - Left Knee Immobilizer - Left: Discontinue once straight leg raise with < 10 degree lag Restrictions Weight Bearing Restrictions: No Other Position/Activity Restrictions: WBAT    Mobility  Bed Mobility Overal bed mobility: Needs Assistance Bed Mobility: Supine to Sit     Supine to sit: Min guard     General bed mobility comments: for safety  Transfers Overall transfer level: Needs assistance Equipment used: Rolling walker (2 wheeled) Transfers: Sit to/from Stand Sit to Stand: From elevated surface;Min assist         General transfer comment: cues for LE management and use of UEs to self assist  Ambulation/Gait Ambulation/Gait assistance: Min assist Gait Distance (Feet): 65 Feet Assistive device: Rolling walker (2 wheeled) Gait Pattern/deviations: Step-to pattern;Step-through pattern;Shuffle;Trunk flexed Gait velocity: cues to slow for safety   General Gait Details: cues for pace, posture and position from [employer]             Wheelchair Mobility    Modified Rankin (Stroke Patients Only)       Balance Overall balance assessment: Needs assistance Sitting-balance support: No upper extremity supported;Feet supported Sitting balance-Leahy Scale: Good     Standing balance  support: Bilateral upper extremity supported Standing balance-Leahy Scale: Poor                              Cognition Arousal/Alertness: Awake/alert Behavior During Therapy: WFL for tasks assessed/performed;Impulsive Overall Cognitive Status: Within Functional Limits for tasks assessed                                        Exercises Total Joint Exercises Ankle Circles/Pumps: AROM;Both;10 reps;Supine    General Comments        Pertinent Vitals/Pain Pain Assessment: 0-10 Pain Score: 6  Pain Location: L knee and calf Pain Descriptors / Indicators: Aching;Throbbing Pain Intervention(s): Limited activity within patient's tolerance;Monitored during session;Premedicated before session (Pt declines ice)    Home Living Family/patient expects to be discharged to:: Private residence Living Arrangements: Spouse/significant other Available Help at Discharge: Family Type of Home: House Home Access: Stairs to enter Entrance Stairs-Rails: Right Home Layout: One level Home Equipment: Environmental consultant - 2 wheels;Wheelchair - manual      Prior Function Level of Independence: Independent          PT Goals (current goals can now be found in the care plan section) Acute Rehab PT Goals Patient Stated Goal: Regain IND PT Goal Formulation: With patient Time For Goal Achievement: 10/17/20 Potential to Achieve Goals: Good    Frequency    7X/week      PT Plan      Co-evaluation  AM-PAC PT "6 Clicks" Mobility   Outcome Measure  Help needed turning from your back to your side while in a flat bed without using bedrails?: A Little Help needed moving from lying on your back to sitting on the side of a flat bed without using bedrails?: A Little Help needed moving to and from a bed to a chair (including a wheelchair)?: A Little Help needed standing up from a chair using your arms (e.g., wheelchair or bedside chair)?: A Little Help needed to walk  in hospital room?: A Little Help needed climbing 3-5 steps with a railing? : A Little 6 Click Score: 18    End of Session Equipment Utilized During Treatment: Gait belt;Left knee immobilizer Activity Tolerance: Patient tolerated treatment well;Patient limited by pain Patient left: in chair;with call bell/phone within reach;with chair alarm set Nurse Communication: Mobility status PT Visit Diagnosis: Difficulty in walking, not elsewhere classified (R26.2);Pain Pain - Right/Left: Left Pain - part of body: Knee;Leg     Time: 0931-1216 PT Time Calculation (min) (ACUTE ONLY): 25 min  Charges: $Gait Training: 8-22 mins          $PT Eval Low Complexity: 1 Low PT evaluation         Debe Coder PT Acute Rehabilitation Services Pager [phone] Office [phone]    Veanna Dower 10/03/2020, 4:54 PM

## 2020-10-03 NOTE — Progress Notes (Signed)
Patient notified this nurse that she is at baseline hypotensive. Iv fluids are infusing.

## 2020-10-03 NOTE — Transfer of Care (Signed)
Immediate Anesthesia Transfer of Care Note  Patient: [name]  Procedure(s) Performed: Procedure(s): LEFT TOTAL KNEE ARTHROPLASTY (Left)  Patient Location: PACU  Anesthesia Type:Spinal  Level of Consciousness:  sedated, patient cooperative and responds to stimulation  Airway & Oxygen Therapy:Patient Spontanous Breathing and Patient connected to face mask oxgen  Post-op Assessment:  Report given to PACU RN and Post -op Vital signs reviewed and stable  Post vital signs:  Reviewed and stable  Last Vitals:  Vitals:   10/03/20 0856 10/03/20 1111  BP: (!) 95/51 98/62  Pulse: 72 66  Resp: 15 11  Temp:  36.6 C  SpO2: 26% 33%    Complications: No apparent anesthesia complications

## 2020-10-03 NOTE — Progress Notes (Signed)
Assisted Dr. Singer with left, ultrasound guided, adductor canal block. Side rails up, monitors on throughout procedure. See vital signs in flow sheet. Tolerated Procedure well.  

## 2020-10-04 DIAGNOSIS — G8929 Other chronic pain: Secondary | ICD-10-CM | POA: Diagnosis present

## 2020-10-04 DIAGNOSIS — Z9981 Dependence on supplemental oxygen: Secondary | ICD-10-CM | POA: Diagnosis not present

## 2020-10-04 DIAGNOSIS — Z881 Allergy status to other antibiotic agents status: Secondary | ICD-10-CM | POA: Diagnosis not present

## 2020-10-04 DIAGNOSIS — J449 Chronic obstructive pulmonary disease, unspecified: Secondary | ICD-10-CM | POA: Diagnosis present

## 2020-10-04 DIAGNOSIS — E785 Hyperlipidemia, unspecified: Secondary | ICD-10-CM | POA: Diagnosis present

## 2020-10-04 DIAGNOSIS — Z880 Allergy status to penicillin: Secondary | ICD-10-CM | POA: Diagnosis not present

## 2020-10-04 DIAGNOSIS — Z882 Allergy status to sulfonamides status: Secondary | ICD-10-CM | POA: Diagnosis not present

## 2020-10-04 DIAGNOSIS — F419 Anxiety disorder, unspecified: Secondary | ICD-10-CM | POA: Diagnosis present

## 2020-10-04 DIAGNOSIS — M797 Fibromyalgia: Secondary | ICD-10-CM | POA: Diagnosis present

## 2020-10-04 DIAGNOSIS — Z86718 Personal history of other venous thrombosis and embolism: Secondary | ICD-10-CM | POA: Diagnosis not present

## 2020-10-04 DIAGNOSIS — Z90710 Acquired absence of both cervix and uterus: Secondary | ICD-10-CM | POA: Diagnosis not present

## 2020-10-04 DIAGNOSIS — D62 Acute posthemorrhagic anemia: Secondary | ICD-10-CM | POA: Diagnosis not present

## 2020-10-04 DIAGNOSIS — Z90722 Acquired absence of ovaries, bilateral: Secondary | ICD-10-CM | POA: Diagnosis not present

## 2020-10-04 DIAGNOSIS — Z96651 Presence of right artificial knee joint: Secondary | ICD-10-CM | POA: Diagnosis present

## 2020-10-04 DIAGNOSIS — M1712 Unilateral primary osteoarthritis, left knee: Secondary | ICD-10-CM | POA: Diagnosis present

## 2020-10-04 DIAGNOSIS — F32A Depression, unspecified: Secondary | ICD-10-CM | POA: Diagnosis present

## 2020-10-04 DIAGNOSIS — E039 Hypothyroidism, unspecified: Secondary | ICD-10-CM | POA: Diagnosis present

## 2020-10-04 DIAGNOSIS — K219 Gastro-esophageal reflux disease without esophagitis: Secondary | ICD-10-CM | POA: Diagnosis present

## 2020-10-04 DIAGNOSIS — Z888 Allergy status to other drugs, medicaments and biological substances status: Secondary | ICD-10-CM | POA: Diagnosis not present

## 2020-10-04 DIAGNOSIS — Z85850 Personal history of malignant neoplasm of thyroid: Secondary | ICD-10-CM | POA: Diagnosis not present

## 2020-10-04 DIAGNOSIS — Z96652 Presence of left artificial knee joint: Secondary | ICD-10-CM

## 2020-10-04 DIAGNOSIS — F17210 Nicotine dependence, cigarettes, uncomplicated: Secondary | ICD-10-CM | POA: Diagnosis present

## 2020-10-04 DIAGNOSIS — Z885 Allergy status to narcotic agent status: Secondary | ICD-10-CM | POA: Diagnosis not present

## 2020-10-04 LAB — CBC
HCT: 33.6 % — ABNORMAL LOW (ref 36.0–46.0)
Hemoglobin: 10.5 g/dL — ABNORMAL LOW (ref 12.0–15.0)
MCH: 30.3 pg (ref 26.0–34.0)
MCHC: 31.3 g/dL (ref 30.0–36.0)
MCV: 97.1 fL (ref 80.0–100.0)
Platelets: 166 10*3/uL (ref 150–400)
RBC: 3.46 MIL/uL — ABNORMAL LOW (ref 3.87–5.11)
RDW: 13.7 % (ref 11.5–15.5)
WBC: 13.4 10*3/uL — ABNORMAL HIGH (ref 4.0–10.5)
nRBC: 0 % (ref 0.0–0.2)

## 2020-10-04 LAB — BASIC METABOLIC PANEL
Anion gap: 6 (ref 5–15)
BUN: 13 mg/dL (ref 8–23)
CO2: 26 mmol/L (ref 22–32)
Calcium: 6.9 mg/dL — ABNORMAL LOW (ref 8.9–10.3)
Chloride: 105 mmol/L (ref 98–111)
Creatinine, Ser: 0.73 mg/dL (ref 0.44–1.00)
GFR, Estimated: >60 mL/min (ref >60)
Glucose, Bld: 110 mg/dL — ABNORMAL HIGH (ref 70–99)
Potassium: 3.9 mmol/L (ref 3.5–5.1)
Sodium: 137 mmol/L (ref 135–145)

## 2020-10-04 NOTE — TOC Progression Note (Signed)
Transition of Care [facility]) - Progression Note    Patient Details  Name: [name] MRN: [id number] Date of Birth: 09-Dec-1950  Transition of Care [facility]) CM/SW [place], LCSW Phone Number: 10/04/2020, 2:43 PM  Clinical Narrative:    CSW acknowledges home health orders.  CSW met with the patient at bedside to arrange home health since [facility] provide services in the [place], [place] location. Spouse reports the patient is scheduled to go to therapy at [place].  Patient confirm she has a RW and 3 in 1.  No other needs identified.    Barriers to Discharge: Continued Medical Work up  Expected Discharge Plan and Services                                           Social Determinants of Health (SDOH) Interventions    Readmission Risk Interventions No flowsheet data found.

## 2020-10-04 NOTE — Progress Notes (Signed)
Patient ID: [name], female   DOB: Nov 21, 1951, 69 y.o.   MRN: [id number] Due to limited mobility and problems with pain control, we will need to keep the patient longer.  She will be switched to an inpatient admission.

## 2020-10-04 NOTE — Progress Notes (Addendum)
Physical Therapy Treatment Patient Details Name: [name] MRN: [id number] DOB: [birth year] Today's Date: 10/04/2020    History of Present Illness Pt s/p L TKR and with hx of R TKR, COPD and Fibromyalgia    PT Comments    Pt found seated in chair in room. Agrees to therapy, although she complains that she "hasn't been to bed yet" since the AM session. She is min G for safety for sit<>stand, requiring additional time, likely due to increased pain. Min A for safety with ambulating 120 ft in the hallway. Once returned to room, pt instructed to ambulate towards bed. She, instead, heads to the room closet, passing the IV pole, IV pole could only be advanced so far. She is instructed to be aware of self-IV pole relationship and not going too far to pull out her IV. Pt is impulsive and distracted. She completes therex, falling asleep midway through. It is undecided if she has a full understanding or easy time recalling the exercises. Plan to ambulate stairs next session.  Follow Up Recommendations  Follow surgeon's recommendation for DC plan and follow-up therapies     Equipment Recommendations  Crutches    Recommendations for Other Services       Precautions / Restrictions Precautions Precautions: Fall Precaution Comments: instructed no pillow under knee Restrictions Weight Bearing Restrictions: No Other Position/Activity Restrictions: WBAT    Mobility  Bed Mobility Overal bed mobility: Needs Assistance Bed Mobility: Sit to Supine     Supine to sit: Min guard Sit to supine: Supervision (supervision, additional time required, pt uses strap for L LE assist onto bed)   General bed mobility comments: increased time "let me do it" anxious using B UE's to self assist L LE  Transfers Overall transfer level: Needs assistance Equipment used: Rolling walker (2 wheeled) Transfers: Sit to/from [facility] Sit to Stand: Min assist Stand pivot transfers: Min assist        General transfer comment: cues to ensure safety. Pt removes gait belt standing at EOB before sitting  Ambulation/Gait Ambulation/Gait assistance: Min guard Gait Distance (Feet): 120 Feet Assistive device: Rolling walker (2 wheeled) Gait Pattern/deviations: Step-through pattern;Decreased stance time - left;Decreased stride length Gait velocity: decreased   General Gait Details: Unaware of self-to-IV pole relationship and proper distancing; cues for not walking too far past IV pole in room where IV pole could not be advanced.   Stairs             Wheelchair Mobility    Modified Rankin (Stroke Patients Only)       Balance                                            Cognition Arousal/Alertness: Awake/alert Behavior During Therapy: WFL for tasks assessed/performed;Impulsive Overall Cognitive Status: Within Functional Limits for tasks assessed                                 General Comments: AxO x 3 HIGH anxiety, hyper, requires redirection to task      Exercises Total Joint Exercises Short Arc Quad: AAROM;Left;5 reps;Supine Hip ABduction/ADduction: Left;5 reps;Supine;AAROM Straight Leg Raises: 10 reps;Left;Supine;AAROM    General Comments        Pertinent Vitals/Pain Pain Assessment: Faces Pain Score: 8  Faces Pain Scale: Hurts whole lot (pain elicited  during therex interventions) Pain Location: L knee Pain Descriptors / Indicators: Aching;Throbbing;Grimacing;Operative site guarding Pain Intervention(s): Monitored during session;Premedicated before session;Repositioned;Ice applied    Home Living                      Prior Function            PT Goals (current goals can now be found in the care plan section) Acute Rehab PT Goals Patient Stated Goal: Regain IND PT Goal Formulation: With patient Time For Goal Achievement: 10/17/20 Potential to Achieve Goals: Good Progress towards PT goals: Progressing toward  goals    Frequency    7X/week      PT Plan Current plan remains appropriate    Co-evaluation              AM-PAC PT "6 Clicks" Mobility   Outcome Measure  Help needed turning from your back to your side while in a flat bed without using bedrails?: A Little Help needed moving from lying on your back to sitting on the side of a flat bed without using bedrails?: A Little Help needed moving to and from a bed to a chair (including a wheelchair)?: A Little Help needed standing up from a chair using your arms (e.g., wheelchair or bedside chair)?: A Little Help needed to walk in hospital room?: A Little Help needed climbing 3-5 steps with a railing? : A Little 6 Click Score: 18    End of Session Equipment Utilized During Treatment: Gait belt Activity Tolerance: Patient tolerated treatment well Patient left: in bed;with call bell/phone within reach;with bed alarm set Nurse Communication: Mobility status PT Visit Diagnosis: Difficulty in walking, not elsewhere classified (R26.2);Pain Pain - Right/Left: Left Pain - part of body: Knee;Leg     Time: 1420 - 144:45 PT Time Calculation (min) (ACUTE ONLY): 24 min  Charges:  $Gait Training: 8-22 mins $Therapeutic Exercise: 8-22 mins                     C. Parks [place], [place] [facility] [phone]  I agree with the following treatment note.  This session was performed under the supervision of a licensed clinician Rica Koyanagi  PTA Acute  Rehabilitation Services Pager      [phone] Office      [phone]

## 2020-10-04 NOTE — Discharge Instructions (Signed)

## 2020-10-04 NOTE — TOC Transition Note (Signed)
Transition of Care [facility]) - CM/SW Discharge Note   Patient Details  Name: [name] MRN: [id number] Date of Birth: [birth year]  [facility]) CM/SW Contact:  Lia Hopping, [place] Phone Number: 10/04/2020, 3:35 PM   Clinical Narrative:    Physician confirmed the patient will need for HHPT.  Patient prefers to use [facility]. CSW confirm PT availability and made a referral to [facility]. The agency will start care on Monday 11/15. CSW notified the patient.  Information written on AVS.   Final next level of care: OP Rehab Barriers to Discharge: Continued Medical Work up   Patient Goals and CMS Choice   CMS Medicare.gov Compare Post Acute Care list provided to:: Patient Choice offered to / list presented to : Patient  Discharge Placement                       Discharge Plan and Services                          HH Arranged: PT [facility] Agency: [place] Date [place]: 10/04/20 Time [place]: [place] Representative spoke with at [place]: [place] ([place]) Interventions     Readmission Risk Interventions No flowsheet data found.

## 2020-10-04 NOTE — Progress Notes (Signed)
Physical Therapy Treatment Patient Details Name: [name] MRN: [id number] DOB: 1951-11-16 Today's Date: 10/04/2020    History of Present Illness Pt s/p L TKR and with hx of R TKR, COPD and Fibromyalgia    PT Comments    POD # 1 am session General Comments: AxO x 3 HIGH anxiety, hyper, requires redirection to task Assisted OOB to amb in hallway.  General Gait Details: 25% VC's on proper walker to self distance and safety with turns.  Then returned to room to perform some TE's following HEP handout.  Instructed on proper tech, freq as well as use of ICE.     Follow Up Recommendations  Follow surgeons recommendation for DC plan and follow-up therapies     Equipment Recommendations  Crutches    Recommendations for Other Services       Precautions / Restrictions Precautions Precautions: Knee;Fall Precaution Comments: instructed no pillow under knee Restrictions Weight Bearing Restrictions: No Other Position/Activity Restrictions: WBAT    Mobility  Bed Mobility Overal bed mobility: Needs Assistance Bed Mobility: Supine to Sit     Supine to sit: Min guard     General bed mobility comments: increased time "let me do it" anxious using B UE's to self assist L LE  Transfers Overall transfer level: Needs assistance Equipment used: Rolling walker (2 wheeled) Transfers: Sit to/from [employer] Transfers Sit to Stand: From elevated surface;Min assist Stand pivot transfers: Min assist       General transfer comment: cues for LE management and use of UEs to self assist and safety with turns  Ambulation/Gait Ambulation/Gait assistance: Min guard Gait Distance (Feet): 75 Feet Assistive device: Rolling walker (2 wheeled) Gait Pattern/deviations: Step-to pattern;Step-through pattern;Shuffle;Trunk flexed Gait velocity: decreased   General Gait Details: 25% VC's on proper walker to self distance and safety with turns   Stairs             Wheelchair  Mobility    Modified Rankin (Stroke Patients Only)       Balance                                            Cognition Arousal/Alertness: Awake/alert Behavior During Therapy: WFL for tasks assessed/performed;Impulsive Overall Cognitive Status: Within Functional Limits for tasks assessed                                 General Comments: AxO x 3 HIGH anxiety, hyper, requires redirection to task      Exercises   10 reps AP 10 reps HS 10 reps towel squeezes   General Comments        Pertinent Vitals/Pain Pain Assessment: 0-10 Pain Score: 8  Pain Location: L knee Pain Descriptors / Indicators: Aching;Throbbing;Grimacing;Operative site guarding Pain Intervention(s): Monitored during session;Premedicated before session;Repositioned;Ice applied    Home Living                      Prior Function            PT Goals (current goals can now be found in the care plan section) Progress towards PT goals: Progressing toward goals    Frequency           PT Plan Current plan remains appropriate    Co-evaluation  AM-PAC PT "6 Clicks" Mobility   Outcome Measure  Help needed turning from your back to your side while in a flat bed without using bedrails?: A Little Help needed moving from lying on your back to sitting on the side of a flat bed without using bedrails?: A Little Help needed moving to and from a bed to a chair (including a wheelchair)?: A Little Help needed standing up from a chair using your arms (e.g., wheelchair or bedside chair)?: A Little Help needed to walk in hospital room?: A Little Help needed climbing 3-5 steps with a railing? : A Little 6 Click Score: 18    End of Session Equipment Utilized During Treatment: Gait belt Activity Tolerance: Patient tolerated treatment well Patient left: in chair;with call bell/phone within reach;with chair alarm set Nurse Communication: Mobility status PT  Visit Diagnosis: Difficulty in walking, not elsewhere classified (R26.2);Pain Pain - Right/Left: Left Pain - part of body: Knee;Leg     Time: 8889-1694 PT Time Calculation (min) (ACUTE ONLY): 24 min  Charges:  $Gait Training: 8-22 mins $Therapeutic Exercise: 8-22 mins                     Rica Koyanagi  PTA Acute  Rehabilitation Services Pager      [phone] Office      [phone]

## 2020-10-04 NOTE — Progress Notes (Signed)
Subjective: 1 Day Post-Op Procedure(s) (LRB): LEFT TOTAL KNEE ARTHROPLASTY (Left) Patient reports pain as severe.  Is on very high chronic pain meds pre-op.  Acute blood los anemia from surgery, but minimal.  Objective: Vital signs in last 24 hours: Temp:  [97.6 F (36.4 C)-100.3 F (37.9 C)] 100.3 F (37.9 C) (11/12 0509) Pulse Rate:  [65-80] 80 (11/12 0509) Resp:  [9-35] 20 (11/12 0509) BP: (92-105)/(51-72) 99/65 (11/12 0509) SpO2:  [91 %-100 %] 93 % (11/12 0509) Weight:  [51.7 kg] 51.7 kg (11/11 0755)  Intake/Output from previous day: 11/11 0701 - 11/12 0700 In: 3109.6 [P.O.:725; I.V.:2334.6; IV Piggyback:50] Out: 970 [Urine:920; Blood:50] Intake/Output this shift: No intake/output data recorded.  Recent Labs    10/04/20 0310  HGB 10.5*   Recent Labs    10/04/20 0310  WBC 13.4*  RBC 3.46*  HCT 33.6*  PLT 166   Recent Labs    10/04/20 0310  NA 137  K 3.9  CL 105  CO2 26  BUN 13  CREATININE 0.73  GLUCOSE 110*  CALCIUM 6.9*   No results for input(s): LABPT, INR in the last 72 hours.  Sensation intact distally Intact pulses distally Dorsiflexion/Plantar flexion intact Incision: scant drainage No cellulitis present Compartment soft   Assessment/Plan: 1 Day Post-Op Procedure(s) (LRB): LEFT TOTAL KNEE ARTHROPLASTY (Left) Up with therapy      [name] 10/04/2020, 7:16 AM

## 2020-10-04 NOTE — Plan of Care (Signed)
Plan of care reviewed and discussed with the patient. 

## 2020-10-05 MED ORDER — ASPIRIN 325 MG PO TBEC
325.0000 mg | DELAYED_RELEASE_TABLET | Freq: Two times a day (BID) | ORAL | 0 refills | Status: DC
Start: 2020-10-05 — End: 2020-10-14

## 2020-10-05 MED ORDER — HYDROMORPHONE HCL 4 MG PO TABS: 4.0000 mg | ORAL_TABLET | ORAL | 0 refills | Status: DC | PRN

## 2020-10-05 NOTE — Plan of Care (Signed)
Patient discharged all care plans met. [name]  

## 2020-10-05 NOTE — Progress Notes (Signed)
Physical Therapy Treatment Patient Details Name: [name] MRN: [id number] DOB: June 02, 1951 Today's Date: 10/05/2020    History of Present Illness Pt s/p L TKR and with hx of R TKR, COPD and Fibromyalgia    PT Comments    POD # 3 Spouse present during session.  General bed mobility comments: self able using her gait belt to loop LE off bed.  General Gait Details: had spouse "hands on" assist pt a functional distance with VC's on safe handling. General stair comments: with spouse "hands on" assist pt with VC's on safe handling practiced 2 stairs steps.  Then returned to room to perform some TE's following HEP handout.  Instructed on proper tech, freq as well as use of ICE.   Addressed all mobility questions, discussed appropriate activity, educated on use of ICE.  Pt ready for D/C to home.    Follow Up Recommendations  Follow surgeon's recommendation for DC plan and follow-up therapies;Home health PT     Equipment Recommendations  None recommended by PT    Recommendations for Other Services       Precautions / Restrictions Precautions Precautions: Fall Precaution Comments: instructed no pillow under knee Restrictions Weight Bearing Restrictions: No Other Position/Activity Restrictions: WBAT    Mobility  Bed Mobility Overal bed mobility: Needs Assistance Bed Mobility: Supine to Sit     Supine to sit: Supervision     General bed mobility comments: self able using her gait belt to loop LE off bed  Transfers Overall transfer level: Needs assistance Equipment used: Rolling walker (2 wheeled) Transfers: Sit to/from [facility] Sit to Stand: Supervision Stand pivot transfers: Supervision       General transfer comment: cues to ensure safety.  Ambulation/Gait Ambulation/Gait assistance: Supervision;Min guard Gait Distance (Feet): 65 Feet Assistive device: Rolling walker (2 wheeled) Gait Pattern/deviations: Step-through pattern;Decreased stance  time - left;Decreased stride length     General Gait Details: had spouse "hands on" assist pt a functional distance with VC's on safe handling   Stairs Stairs: Yes Stairs assistance: Min guard;Min assist Stair Management: Two rails;Step to pattern;Forwards Number of Stairs: 2 General stair comments: with spouse "hands on" assist pt with VC's on safe handling   Wheelchair Mobility    Modified Rankin (Stroke Patients Only)       Balance                                            Cognition Arousal/Alertness: Awake/alert Behavior During Therapy: WFL for tasks assessed/performed;Impulsive Overall Cognitive Status: Within Functional Limits for tasks assessed                                 General Comments: AxO x 3 pleasant      Exercises      General Comments        Pertinent Vitals/Pain Pain Assessment: Faces Faces Pain Scale: Hurts little more Pain Location: L knee Pain Descriptors / Indicators: Aching;Throbbing;Grimacing;Operative site guarding Pain Intervention(s): Monitored during session;Premedicated before session;Repositioned    Home Living                      Prior Function            PT Goals (current goals can now be found in the care plan section) Progress towards  PT goals: Progressing toward goals    Frequency    7X/week      PT Plan Current plan remains appropriate    Co-evaluation              AM-PAC PT "6 Clicks" Mobility   Outcome Measure  Help needed turning from your back to your side while in a flat bed without using bedrails?: None Help needed moving from lying on your back to sitting on the side of a flat bed without using bedrails?: None Help needed moving to and from a bed to a chair (including a wheelchair)?: None Help needed standing up from a chair using your arms (e.g., wheelchair or bedside chair)?: A Little Help needed to walk in hospital room?: A Little Help needed  climbing 3-5 steps with a railing? : A Little 6 Click Score: 21    End of Session Equipment Utilized During Treatment: Gait belt Activity Tolerance: Patient tolerated treatment well Patient left: in chair;with call bell/phone within reach Nurse Communication: Mobility status PT Visit Diagnosis: Difficulty in walking, not elsewhere classified (R26.2);Pain Pain - Right/Left: Left Pain - part of body: Knee;Leg     Time: 1030-1055 PT Time Calculation (min) (ACUTE ONLY): 25 min  Charges:  $Gait Training: 8-22 mins $Therapeutic Activity: 8-22 mins                     {Angy Swearengin  PTA Acute  Rehabilitation Services Pager      [phone] Office      [phone]

## 2020-10-05 NOTE — Discharge Summary (Signed)
Patient ID: [name] MRN: [id number] DOB/AGE: [birth year] 69 y.o.  Admit date: 10/03/2020 Discharge date: 10/05/2020  Admission Diagnoses:  Principal Problem:   Unilateral primary osteoarthritis, left knee Active Problems:   Status post total knee replacement, left   Status post total left knee replacement   Discharge Diagnoses:  Same  Past Medical History:  Diagnosis Date  . Anxiety    states frequent  anxiety attacks  . Asthma   . Cancer (HCC)    HX OF THYROID CANCER   . Cold no drainage   since thursday 07-21-12, chest xray done 07-24-2012   . COPD (chronic obstructive pulmonary disease) ([place])    LOV with PFT 1/13  Dr Linde Gillis on chart-   pt states no changes since last saw pulmonary  . Depression   . Fibromyalgia    states has had trigger point injections shoulders-   . GERD (gastroesophageal reflux disease)   . Headache(784.0)    migraines  . History of DVT of lower extremity 1972   right leg  . Hyperlipidemia   . Hypothyroidism   . OA (osteoarthritis)   . On home oxygen therapy    2 liters per minute per [facility] as needed  . Seasonal allergies    sgtates clear nasal drainage x 3- days- states no fever  . Shortness of breath    oxygen at 2 liters at night, WITH EXERTION     Surgeries: Procedure(s): LEFT TOTAL KNEE ARTHROPLASTY on 10/03/2020   Consultants:   Discharged Condition: Improved  Hospital Course: Jenney Brester is an 69 y.o. female who was admitted 10/03/2020 for operative treatment ofUnilateral primary osteoarthritis, left knee. Patient has severe unremitting pain that affects sleep, daily activities, and work/hobbies. After pre-op clearance the patient was taken to the operating room on 10/03/2020 and underwent  Procedure(s): LEFT TOTAL KNEE ARTHROPLASTY.    Patient was given perioperative antibiotics:  Anti-infectives (From admission, onward)   Start     Dose/Rate Route Frequency Ordered Stop   10/03/20 1530  clindamycin (CLEOCIN) IVPB  600 mg        600 mg 100 mL/hr over 30 Minutes Intravenous Every 6 hours 10/03/20 1317 10/03/20 2306   10/03/20 0715  clindamycin (CLEOCIN) IVPB 900 mg        900 mg 100 mL/hr over 30 Minutes Intravenous On call to O.R. 10/03/20 0947 10/03/20 0920       Patient was given sequential compression devices, early ambulation, and chemoprophylaxis to prevent DVT.  Patient benefited maximally from hospital stay and there were no complications.    Recent vital signs:  Patient Vitals for the past 24 hrs:  BP Temp Temp src Pulse Resp SpO2  10/05/20 0645 104/64 (!) 101.4 F (38.6 C) Oral 92 16 (!) 89 %  10/04/20 2230 103/62 98.1 F (36.7 C) Oral 77 19 96 %  10/04/20 1450 (!) 95/58 99 F (37.2 C) Oral 75 19 94 %     Recent laboratory studies:  Recent Labs    10/04/20 0310  WBC 13.4*  HGB 10.5*  HCT 33.6*  PLT 166  NA 137  K 3.9  CL 105  CO2 26  BUN 13  CREATININE 0.73  GLUCOSE 110*  CALCIUM 6.9*     Discharge Medications:   Allergies as of 10/05/2020      Reactions   Codeine Nausea And Vomiting   Doxycycline Nausea And Vomiting   Penicillins Other (See Comments)   Family cannot take / update 03/02/12 - pt.  does not remember the reaction.   Sulfa Antibiotics Nausea And Vomiting   Tylenol [acetaminophen] Other (See Comments)   Cannot take with Neurontin / update 03/02/12 - causes diarrhea   Vistaril [hydroxyzine Hcl] Other (See Comments)    Hallucinations      Medication List    STOP taking these medications   aspirin 325 MG tablet Replaced by: aspirin 325 MG EC tablet     TAKE these medications   albuterol 108 (90 Base) MCG/ACT inhaler Commonly known as: VENTOLIN HFA Inhale 2 puffs into the lungs every 6 (six) hours as needed for wheezing or shortness of breath.   aspirin 325 MG EC tablet Take 1 tablet (325 mg total) by mouth 2 (two) times daily after a meal. Replaces: aspirin 325 MG tablet   atorvastatin 20 MG tablet Commonly known as: LIPITOR Take 10 mg by  mouth daily before breakfast.   B-complex with vitamin C tablet Take 1 tablet by mouth daily.   buPROPion 150 MG 12 hr tablet Commonly known as: WELLBUTRIN SR Take 450 mg by mouth daily before breakfast.   butalbital-acetaminophen-caffeine 50-325-40 MG tablet Commonly known as: FIORICET Take 1 tablet by mouth 2 (two) times daily as needed for headache or migraine.   CALCIUM + D PO Take 1 tablet by mouth 2 (two) times daily.   clonazePAM 0.5 MG tablet Commonly known as: KLONOPIN Take 0.25-0.5 mg by mouth 3 (three) times daily as needed for anxiety.   erythromycin ophthalmic ointment Place 1 application into the left eye at bedtime.   escitalopram 20 MG tablet Commonly known as: LEXAPRO Take 20 mg by mouth every morning.   estradiol 1 MG tablet Commonly known as: ESTRACE Take 1 mg by mouth daily.   fentaNYL 50 MCG/HR Commonly known as: [place] 1 patch onto the skin every other day.   fexofenadine 180 MG tablet Commonly known as: ALLEGRA Take 180 mg by mouth daily.   fluticasone 50 MCG/ACT nasal spray Commonly known as: FLONASE Place 1 spray into both nostrils daily as needed for allergies.   Fluticasone-Umeclidin-Vilant 100-62.5-25 MCG/INH Aepb Inhale 1 puff into the lungs daily.   gabapentin 300 MG capsule Commonly known as: NEURONTIN Take 300 mg by mouth 2 (two) times daily.   gentamicin-prednisoLONE 0.3-1 % ophthalmic drops Place 1 drop into the left eye daily.   glycopyrrolate 1 MG tablet Commonly known as: ROBINUL Take 1 mg by mouth at bedtime.   HYDROmorphone 4 MG tablet Commonly known as: DILAUDID Take 1 tablet (4 mg total) by mouth every 4 (four) hours as needed for severe pain (if oxycodone not effective). What changed:   medication strength  how much to take  reasons to take this   levothyroxine 25 MCG tablet Commonly known as: SYNTHROID Take 25 mcg by mouth daily before breakfast.   naratriptan 2.5 MG tablet Commonly known as:  AMERGE Take 2.5 mg by mouth as needed for migraine.   Narcan 4 MG/0.1ML Liqd nasal spray kit Generic drug: naloxone Place 0.4 mg into the nose once.   PEPCID PO Take by mouth.   pilocarpine 5 MG tablet Commonly known as: SALAGEN Take 5 mg by mouth 2 (two) times daily.   RABEprazole 20 MG tablet Commonly known as: ACIPHEX Take 20 mg by mouth daily.   rOPINIRole 2 MG tablet Commonly known as: REQUIP Take 4 mg by mouth at bedtime.   traZODone 50 MG tablet Commonly known as: DESYREL Take 50-100 mg by mouth at bedtime.  valACYclovir 1000 MG tablet Commonly known as: VALTREX Take 1,000 mg by mouth 2 (two) times daily as needed (outbreak).   verapamil 40 MG tablet Commonly known as: CALAN Take 40 mg by mouth 2 (two) times daily.   vitamin C 1000 MG tablet Take 1,000 mg by mouth 2 (two) times daily.            Durable Medical Equipment  (From admission, onward)         Start     Ordered   10/03/20 1318  DME 3 n 1  Once        10/03/20 1317   10/03/20 1318  DME Walker rolling  Once       Question Answer Comment  Walker: With 5 Inch Wheels   Patient needs a walker to treat with the following condition Status post total left knee replacement      10/03/20 1317          Diagnostic Studies: DG Knee Left Port  Result Date: 10/03/2020 CLINICAL DATA:  Status post total knee replacement EXAM: PORTABLE LEFT KNEE - 1-2 VIEW COMPARISON:  July 18, 2020 FINDINGS: Frontal and lateral views were obtained. Patient is status post total knee replacement on the left with femoral and tibial prosthetic components well-seated. No acute fracture or dislocation. No erosive change. There is soft tissue air within the joint as well as overlying skin staples. IMPRESSION: Status post total knee replacement with prosthetic components well-seated. No fracture or dislocation. Acute postoperative changes noted. Electronically Signed   By: Lowella Grip III M.D.   On: 10/03/2020 12:13     Disposition: Discharge disposition: 01-Home or [facility]    Mcarthur Rossetti, MD Follow up in 2 week(s).   Specialty: Orthopedic Surgery Contact information: [place] [address] [facility].   Why: This agency will provide home physical therapy. The agency will contact you prior to the first visit. Contact information: [address], [place] [address]   Contact: [phone]               Signed: Mcarthur Rossetti 10/05/2020, 10:25 AM

## 2020-10-05 NOTE — Progress Notes (Signed)
Subjective: 2 Days Post-Op Procedure(s) (LRB): LEFT TOTAL KNEE ARTHROPLASTY (Left) Patient reports pain as moderate.    Objective: Vital signs in last 24 hours: Temp:  [98.1 F (36.7 C)-101.4 F (38.6 C)] 101.4 F (38.6 C) (11/13 0645) Pulse Rate:  [75-92] 92 (11/13 0645) Resp:  [16-19] 16 (11/13 0645) BP: (95-104)/(58-64) 104/64 (11/13 0645) SpO2:  [89 %-96 %] 89 % (11/13 0645)  Intake/Output from previous day: 11/12 0701 - 11/13 0700 In: 480 [P.O.:480] Out: 875 [Urine:875] Intake/Output this shift: Total I/O In: 240 [P.O.:240] Out: 300 [Urine:300]  Recent Labs    10/04/20 0310  HGB 10.5*   Recent Labs    10/04/20 0310  WBC 13.4*  RBC 3.46*  HCT 33.6*  PLT 166   Recent Labs    10/04/20 0310  NA 137  K 3.9  CL 105  CO2 26  BUN 13  CREATININE 0.73  GLUCOSE 110*  CALCIUM 6.9*   No results for input(s): LABPT, INR in the last 72 hours.  Sensation intact distally Intact pulses distally Dorsiflexion/Plantar flexion intact Incision: scant drainage No cellulitis present Compartment soft   Assessment/Plan: 2 Days Post-Op Procedure(s) (LRB): LEFT TOTAL KNEE ARTHROPLASTY (Left) Up with therapy Discharge home with home health      Mcarthur Rossetti 10/05/2020, 10:22 AM

## 2020-10-05 NOTE — Plan of Care (Signed)
Plan of care reviewed and discussed with the patient. 

## 2020-10-05 NOTE — Plan of Care (Signed)
  Problem: Education: Goal: Knowledge of General Education information will improve Description: Including pain rating scale, medication(s)/side effects and non-pharmacologic comfort measures Outcome: Progressing   Problem: Health Behavior/Discharge Planning: Goal: Ability to manage health-related needs will improve Outcome: Progressing   Problem: Clinical Measurements: Goal: Will remain free from infection Outcome: Progressing   

## 2020-10-07 ENCOUNTER — Encounter (HOSPITAL_COMMUNITY): Payer: Self-pay | Admitting: Orthopaedic Surgery

## 2020-10-09 ENCOUNTER — Other Ambulatory Visit: Payer: Self-pay | Admitting: Orthopaedic Surgery

## 2020-10-09 ENCOUNTER — Encounter: Payer: Self-pay | Admitting: Physician Assistant

## 2020-10-09 ENCOUNTER — Ambulatory Visit (INDEPENDENT_AMBULATORY_CARE_PROVIDER_SITE_OTHER): Payer: Medicare Other | Admitting: Physician Assistant

## 2020-10-09 DIAGNOSIS — Z96652 Presence of left artificial knee joint: Secondary | ICD-10-CM

## 2020-10-09 MED ORDER — HYDROMORPHONE HCL 4 MG PO TABS: 4.0000 mg | ORAL_TABLET | ORAL | 0 refills | Status: DC | PRN

## 2020-10-09 NOTE — Progress Notes (Signed)
HPI: [name] returns today status post left total knee arthroplasty 10/03/2020.  She comes in today swelling redness about the incision site.  She is having no shortness of breath or chest pain.  States she has had a temperature of 100.4.  Physical exam: Left knee dressing removed staples well approximated incision no signs of infection.  There are some slight erythema swelling with expected postoperatively.  Calf supple nontender.  Dorsiflexion plantarflexion left ankle intact.  Soreness about the heel but no skin breakdown.  Pression: Status post left total knee arthroplasty 10/03/2020  Plan: Changed her dressing out today.  Recommend elevation wiggling toes.  Also recommend floating her heels.  Dr. Ninfa Linden refilled her hydromorphone.  See her back at her regularly scheduled appointment.  Questions were encouraged and answered.

## 2020-10-12 ENCOUNTER — Other Ambulatory Visit: Payer: Self-pay | Admitting: Orthopaedic Surgery

## 2020-10-14 ENCOUNTER — Telehealth: Payer: Self-pay | Admitting: Orthopaedic Surgery

## 2020-10-14 NOTE — Telephone Encounter (Signed)
Refill

## 2020-10-14 NOTE — Telephone Encounter (Signed)
Wasn't she to be getting this from her pain mgmt doc?

## 2020-10-14 NOTE — Telephone Encounter (Signed)
Pt called requesting a refill on her medication as of right now she has 7 left.  4mg  tablets

## 2020-10-14 NOTE — Telephone Encounter (Signed)
Yes.  She is now supposed to be getting her pain medication through her pain management and not me.

## 2020-10-15 NOTE — Telephone Encounter (Signed)
Spoke with patients husband and let him know this

## 2020-10-16 ENCOUNTER — Ambulatory Visit (INDEPENDENT_AMBULATORY_CARE_PROVIDER_SITE_OTHER): Payer: Medicare Other | Admitting: Orthopaedic Surgery

## 2020-10-16 DIAGNOSIS — Z96652 Presence of left artificial knee joint: Secondary | ICD-10-CM

## 2020-10-16 NOTE — Progress Notes (Signed)
The patient comes today 2 weeks status post a left total knee arthroplasty.  She said the knee is moving more.  Exam she lacks full extension by few degrees and can only flex to about 60 or 70 degrees.  She is in chronic pain management and does have severe chronic pain.  She does tell me that when she had her right knee replaced remotely that they had to perform an ambulation under anesthesia.  Her incision looks good.  I removed the staples and placed Steri-Strips Steri-Strips.  I encouraged her to get into outpatient therapy as soon as she could to aggressively work on motion of the knee.  We will see her back in 4 weeks to see how she is doing overall.

## 2020-10-21 ENCOUNTER — Other Ambulatory Visit: Payer: Self-pay | Admitting: Orthopaedic Surgery

## 2020-10-21 NOTE — Telephone Encounter (Signed)
Continue

## 2020-11-06 ENCOUNTER — Telehealth: Payer: Self-pay | Admitting: Orthopaedic Surgery

## 2020-11-06 NOTE — Telephone Encounter (Signed)
If I'm not mistaken, we don't do gel injections for the shoulder any more.

## 2020-11-06 NOTE — Telephone Encounter (Signed)
Patient asking for you to submit for gel injections. Patient is asking for injection is both shoulders on appt 12/22. I explained it might not be that soon but I will send to April.

## 2020-11-07 ENCOUNTER — Telehealth: Payer: Self-pay | Admitting: Orthopaedic Surgery

## 2020-11-07 NOTE — Telephone Encounter (Signed)
Called patient to notify patient we no longer do shoulder gel injections per April. Patient's husband Legrand Como answered and I left message for patient to call back and see if she would like cortisone injections instead. I changed injections on her upcoming visit to cortisone injections per husband's approval. Will change if patient changes her mind about cortisone injections.

## 2020-11-11 ENCOUNTER — Ambulatory Visit: Payer: Medicare Other | Admitting: Physician Assistant

## 2020-11-13 ENCOUNTER — Encounter: Payer: Self-pay | Admitting: Orthopaedic Surgery

## 2020-11-13 ENCOUNTER — Ambulatory Visit (INDEPENDENT_AMBULATORY_CARE_PROVIDER_SITE_OTHER): Payer: Medicare Other | Admitting: Orthopaedic Surgery

## 2020-11-13 ENCOUNTER — Other Ambulatory Visit: Payer: Self-pay

## 2020-11-13 DIAGNOSIS — Z96652 Presence of left artificial knee joint: Secondary | ICD-10-CM

## 2020-11-13 NOTE — Progress Notes (Signed)
The patient comes in today for continued follow-up 6 weeks status post a left total knee arthroplasty.  She is making progress with home therapy and has outpatient therapy coming up.  On exam she has almost full extension at this point of her left knee.  Her flexion is to just past 90 degrees.  She is 69 years old and is on chronic narcotics.  I do feel that she is making progress and she feels like she is doing better.  She will continue to push herself through therapy and getting her knee bending and moving.  I will see her back in 4 weeks to see how she is doing overall.  All question concerns were answered and addressed.

## 2020-11-27 ENCOUNTER — Ambulatory Visit (INDEPENDENT_AMBULATORY_CARE_PROVIDER_SITE_OTHER): Payer: Medicare Other | Admitting: Orthopaedic Surgery

## 2020-11-27 ENCOUNTER — Encounter: Payer: Self-pay | Admitting: Orthopaedic Surgery

## 2020-11-27 DIAGNOSIS — G8929 Other chronic pain: Secondary | ICD-10-CM | POA: Diagnosis not present

## 2020-11-27 DIAGNOSIS — M25512 Pain in left shoulder: Secondary | ICD-10-CM | POA: Diagnosis not present

## 2020-11-27 DIAGNOSIS — M25511 Pain in right shoulder: Secondary | ICD-10-CM

## 2020-11-27 DIAGNOSIS — Z96652 Presence of left artificial knee joint: Secondary | ICD-10-CM

## 2020-11-27 MED ORDER — LIDOCAINE HCL 1 % IJ SOLN
3.0000 mL | INTRAMUSCULAR | Status: AC | PRN
  Administered 2020-11-27: 3 mL

## 2020-11-27 MED ORDER — METHYLPREDNISOLONE ACETATE 40 MG/ML IJ SUSP
40.0000 mg | INTRAMUSCULAR | Status: AC | PRN
  Administered 2020-11-27: 40 mg via INTRA_ARTICULAR

## 2020-11-27 NOTE — Progress Notes (Signed)
   Procedure Note  Patient: [name]             Date of Birth: 12-Apr-1951           MRN: [id number]             Visit Date: 11/27/2020  Procedures: Visit Diagnoses:  1. Chronic pain of both shoulders     Large Joint Inj: R subacromial bursa on 11/27/2020 3:39 PM Indications: pain and diagnostic evaluation Details: 22 G 1.5 in needle  Arthrogram: No  Medications: 3 mL lidocaine 1 %; 40 mg methylPREDNISolone acetate 40 MG/ML Outcome: tolerated well, no immediate complications Procedure, treatment alternatives, risks and benefits explained, specific risks discussed. Consent was given by the patient. Immediately prior to procedure a time out was called to verify the correct patient, procedure, equipment, support staff and site/side marked as required. Patient was prepped and draped in the usual sterile fashion.   Large Joint Inj: L subacromial bursa on 11/27/2020 3:39 PM Indications: pain and diagnostic evaluation Details: 22 G 1.5 in needle  Arthrogram: No  Medications: 3 mL lidocaine 1 %; 40 mg methylPREDNISolone acetate 40 MG/ML Outcome: tolerated well, no immediate complications Procedure, treatment alternatives, risks and benefits explained, specific risks discussed. Consent was given by the patient. Immediately prior to procedure a time out was called to verify the correct patient, procedure, equipment, support staff and site/side marked as required. Patient was prepped and draped in the usual sterile fashion.    The patient has known bilateral chronic shoulder pain with chronic tendinitis and impingement.  She has had injections in the past and is requesting injections today of both shoulders.  Is been well over 3 months since have injected her shoulders.  She is otherwise doing well healthwise.  Examination both shoulder show that they are well located.  She is very cachectic with her shoulders.  She has a lot of global tenderness.  I did place subacromial steroid injections  in both shoulders today.  I will see her back in 4 weeks to see how she is doing overall from her shoulders but also to examine her left operative knee at that visit from her total knee arthroplasty that was done in November.  No x-rays are needed.

## 2020-12-11 ENCOUNTER — Ambulatory Visit: Payer: Medicare Other | Admitting: Orthopaedic Surgery

## 2020-12-19 IMAGING — MR MR KNEE*L* W/O CM
4 of 6 series · 20 of 40 positions shown · non-contrast
Comparison: None.

CLINICAL DATA: Posterior left knee pain and swelling for 6 months.
No known injury.

EXAM:
MRI OF THE LEFT KNEE WITHOUT CONTRAST
TECHNIQUE: Multiplanar, multisequence MR imaging of the knee was performed. No
intravenous contrast was administered.

[Series 3: T2 fat-sat · axial · 4.0mm · 0.50mm/px · z∈[-3,+92]mm · 3 of 24 slices shown (1 of 2)]
[im 5/24]
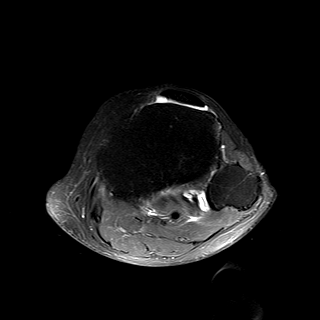
[im 14/24]
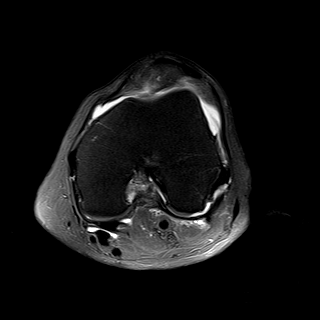
[im 24/24]
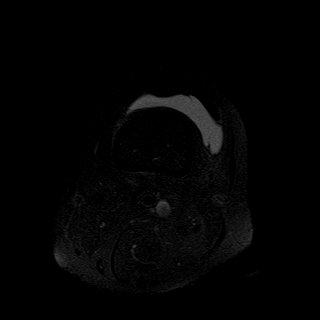

[Series 4: T2 fat-sat · coronal · 4.0mm · 0.29mm/px · 3 of 23 slices shown (2 of 2)]
[im 5/23]
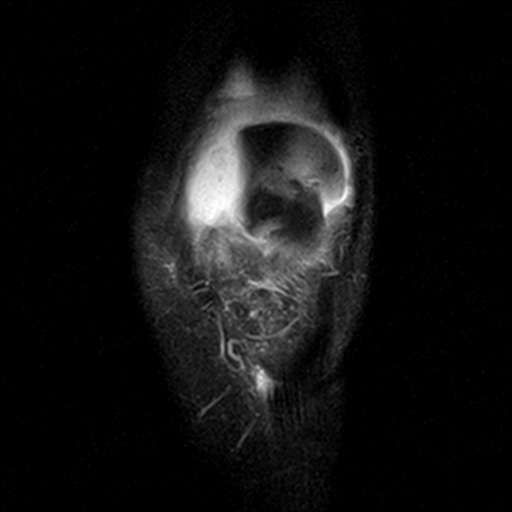
[im 14/23]
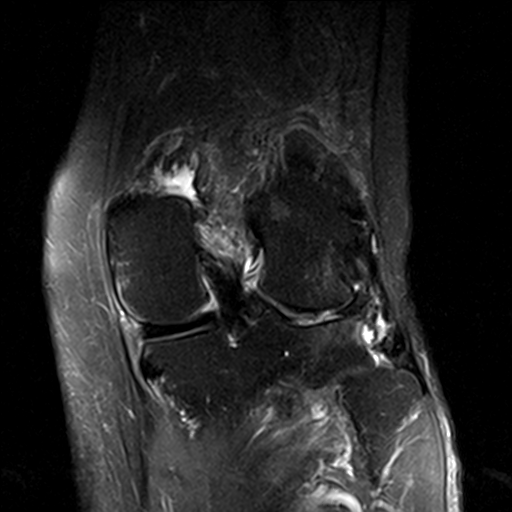
[im 23/23]
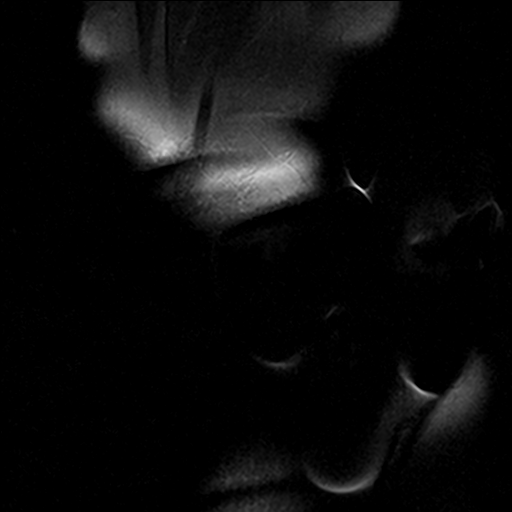

[Series 7: PD fat-sat · sagittal · 3.0mm · 0.29mm/px · 7 of 25 slices shown (1 of 2)]
[im 1/25]
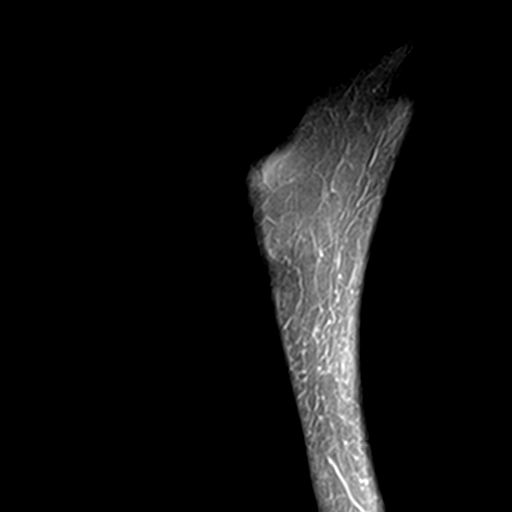
[im 5/25]
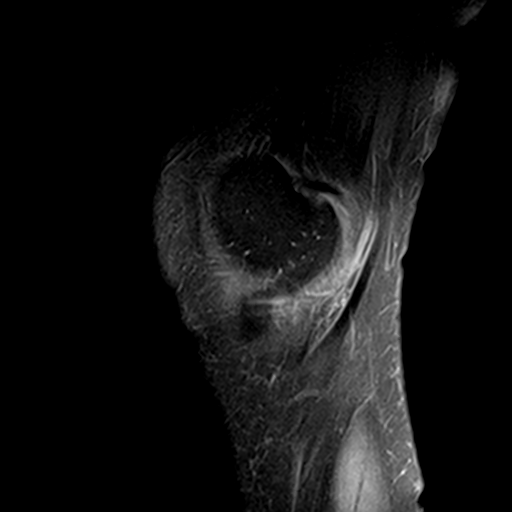
[im 9/25]
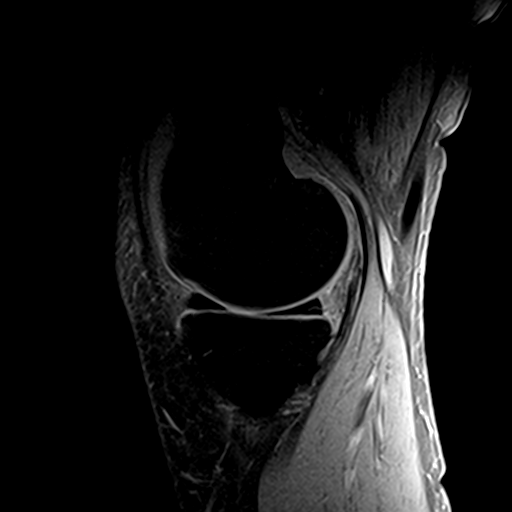
[im 13/25]
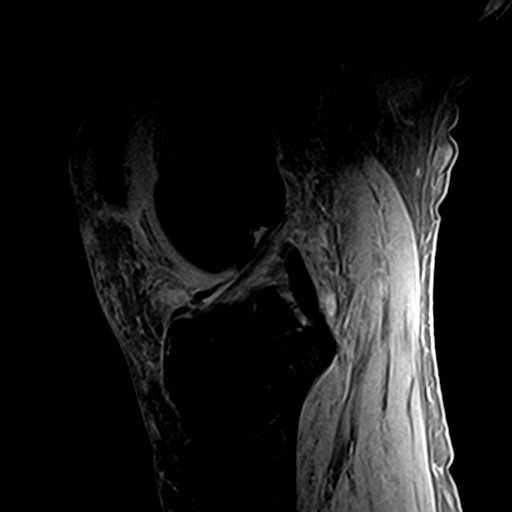
[im 17/25]
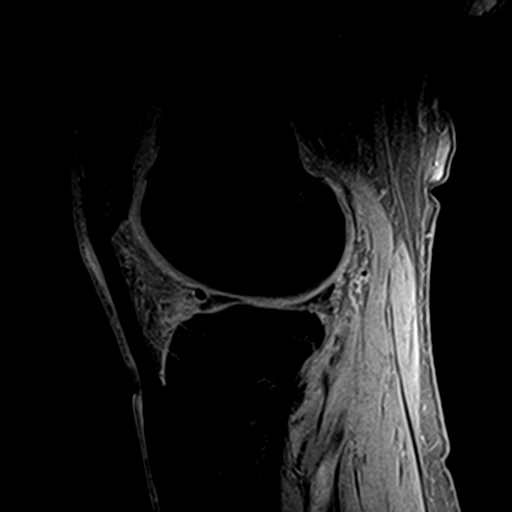
[im 21/25]
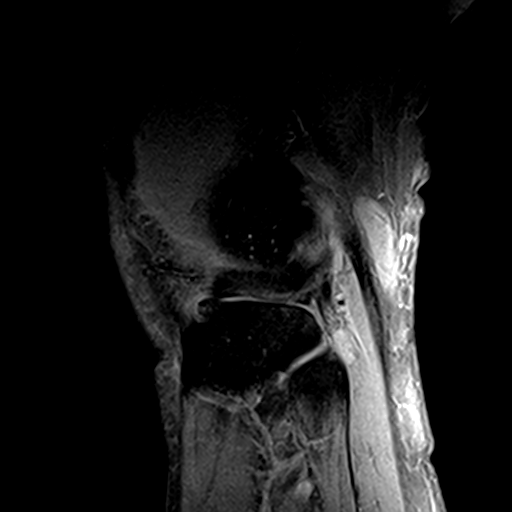
[im 25/25]
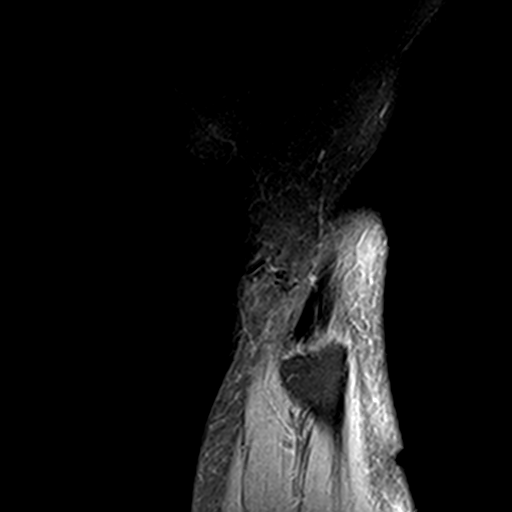

[Series 8: PD fat-sat · coronal · 3.0mm · 0.29mm/px · 7 of 28 slices shown (2 of 2)]
[im 1/28]
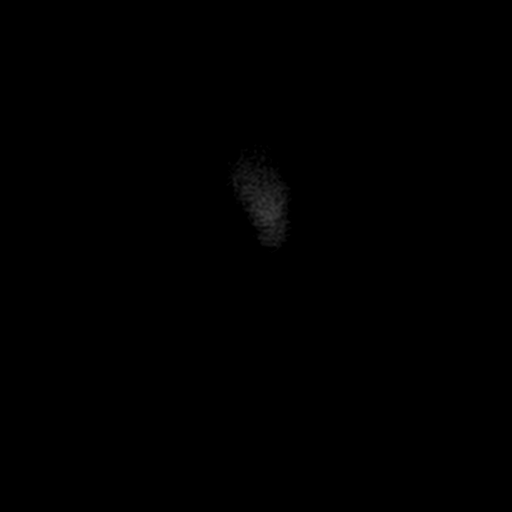
[im 4/28]
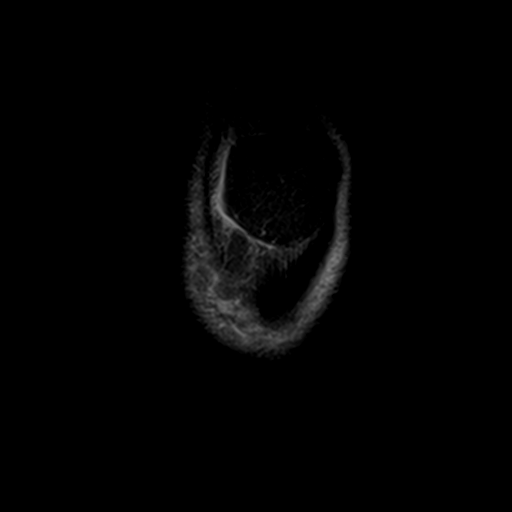
[im 8/28]
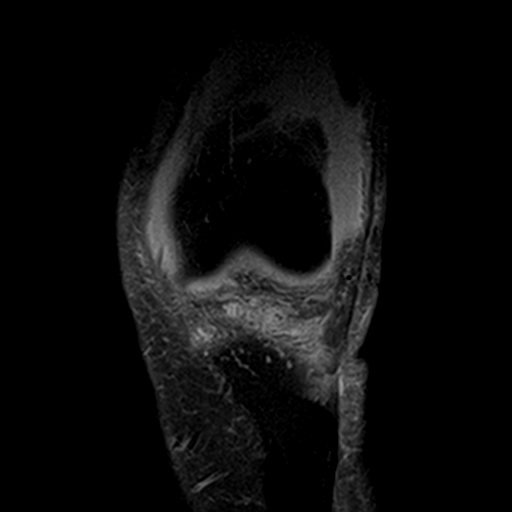
[im 12/28]
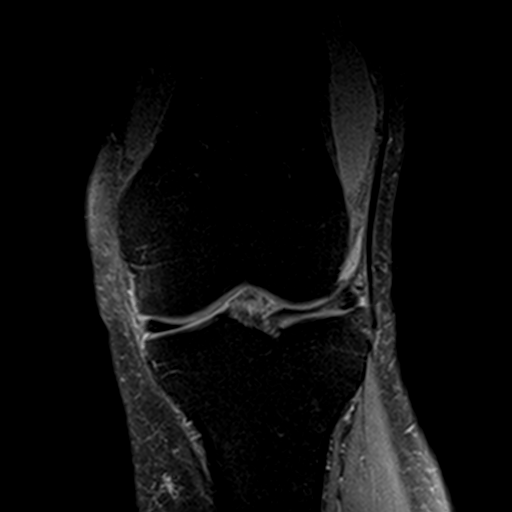
[im 16/28]
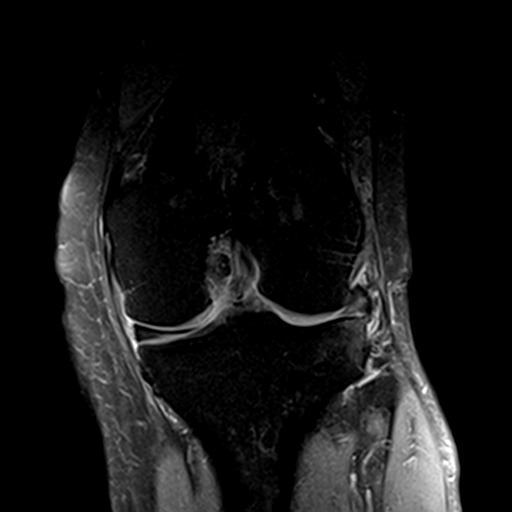
[im 20/28]
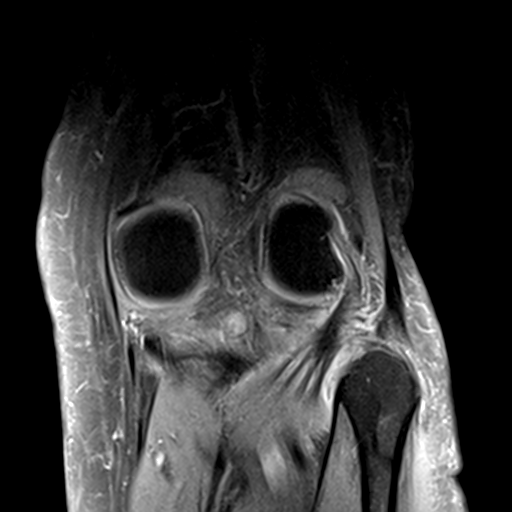
[im 24/28]
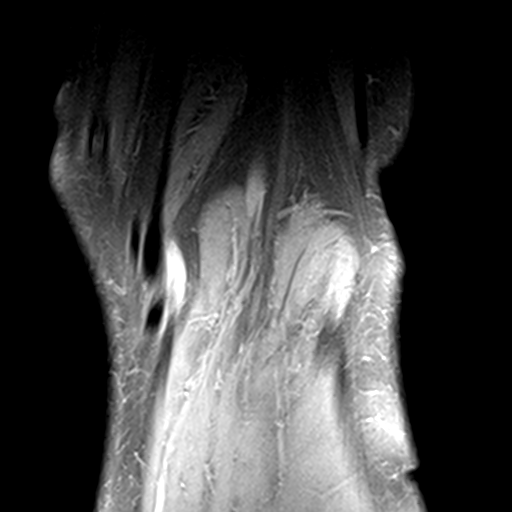

[20 of 40 positions shown; findings below may reference images not displayed]

FINDINGS: MENISCI

Medial meniscus: Horizontal tear in the posterior body reaches the
meniscal undersurface.

Lateral meniscus: There is complex tearing throughout the posterior
horn. The body is severely degenerated and extruded peripherally.
Blunting throughout the free edge of the body is compatible with
radial tearing.

LIGAMENTS

Cruciates:  Intact.

Collaterals:  Intact.

CARTILAGE

Patellofemoral: Moderate cartilage thinning is seen along the
lateral patellar facet.

Medial:  Moderately degenerated without focal defect.

Lateral: Markedly degenerated with associated joint space narrowing.

Joint: The patient has a small joint effusion. Synovium about the
knee appear somewhat thickened compatible with synovitis.

Popliteal Fossa: Small Baker's cyst. A small volume of fluid is also
seen in the popliteal hiatus.

Extensor Mechanism:  Intact.

Bones: There is some subchondral edema about the lateral
compartment. Osteophytosis about the knee is most notable on the
lateral side.

Other: None.
IMPRESSION: Dominant finding is osteoarthritis about the knee which is worst in
the lateral compartment.

Complex tearing posterior horn of the lateral meniscus. The body of
the lateral meniscus is severely degenerated and extruded
peripherally with radial tearing along its free edge.

Small appearing horizontal tear posterior horn medial meniscus.

Synovitis about the knee.

## 2020-12-25 ENCOUNTER — Ambulatory Visit (INDEPENDENT_AMBULATORY_CARE_PROVIDER_SITE_OTHER): Payer: Medicare Other | Admitting: Orthopaedic Surgery

## 2020-12-25 ENCOUNTER — Encounter: Payer: Self-pay | Admitting: Orthopaedic Surgery

## 2020-12-25 DIAGNOSIS — Z96652 Presence of left artificial knee joint: Secondary | ICD-10-CM

## 2020-12-25 NOTE — Progress Notes (Signed)
Mrs. [name] is now 10 weeks status post a left total knee arthroplasty. She does state that she is making progress with her knee range of motion. I did inject both of her shoulders a month ago and she said that did not really help much for her.  Examination of her left operative knee shows it is still warm to be expected with her swelling. Her extension is almost full and I can flex her to past 100 degrees.  We can certainly see her back in 2 months for repeat shoulder injections. At that visit I would like to get 3 views of both shoulders since it has been a while since we have x-rayed the shoulders.

## 2021-02-24 ENCOUNTER — Other Ambulatory Visit: Payer: Self-pay

## 2021-02-24 ENCOUNTER — Encounter: Payer: Self-pay | Admitting: Orthopaedic Surgery

## 2021-02-24 ENCOUNTER — Ambulatory Visit (INDEPENDENT_AMBULATORY_CARE_PROVIDER_SITE_OTHER): Payer: Medicare Other | Admitting: Orthopaedic Surgery

## 2021-02-24 DIAGNOSIS — Z96611 Presence of right artificial shoulder joint: Secondary | ICD-10-CM

## 2021-02-24 DIAGNOSIS — M25512 Pain in left shoulder: Secondary | ICD-10-CM

## 2021-02-24 DIAGNOSIS — M25511 Pain in right shoulder: Secondary | ICD-10-CM

## 2021-02-24 DIAGNOSIS — G8929 Other chronic pain: Secondary | ICD-10-CM

## 2021-02-24 MED ORDER — LIDOCAINE HCL 1 % IJ SOLN
3.0000 mL | INTRAMUSCULAR | Status: AC | PRN
  Administered 2021-02-24: 3 mL

## 2021-02-24 MED ORDER — METHYLPREDNISOLONE ACETATE 40 MG/ML IJ SUSP
40.0000 mg | INTRAMUSCULAR | Status: AC | PRN
  Administered 2021-02-24: 40 mg via INTRA_ARTICULAR

## 2021-02-24 NOTE — Progress Notes (Signed)
Office Visit Note   Patient: [name]           Date of Birth: 08-06-1951           MRN: [id number] Visit Date: 02/24/2021              Requested by: Kelton Pillar, MD [place] [address] [place],  [place] [address] PCP: Kelton Pillar, MD   Assessment & Plan: Visit Diagnoses:  1. Chronic pain of both shoulders     Plan: Per her request I did provide a steroid injection in both shoulder subacromial eyelids.  At this point we have injected both shoulders multiple times.  A MRI of the right more painful shoulder is warranted just assess the architecture of the shoulder in general in terms of the cartilage of the glenohumeral joint as well as the rotator cuff itself.  From the knee standpoint, I do not need to see her back for 6 months for that knee for an AP and lateral of the left knee at that visit.  She would like [place] to call her with the MRI results of the right shoulder.  If surgery is recommended for the right shoulder, she would need to have this at [facility] she states since the deal better with her pain management meds and needs.  Follow-Up Instructions: Return in about 6 months (around 08/26/2021).   Orders:  Orders Placed This Encounter  Procedures  . Large Joint Inj  . Large Joint Inj   No orders of the defined types were placed in this encounter.     Procedures: Large Joint Inj: R subacromial bursa on 02/24/2021 2:49 PM Indications: pain and diagnostic evaluation Details: 22 G 1.5 in needle  Arthrogram: No  Medications: 3 mL lidocaine 1 %; 40 mg methylPREDNISolone acetate 40 MG/ML Outcome: tolerated well, no immediate complications Procedure, treatment alternatives, risks and benefits explained, specific risks discussed. Consent was given by the patient. Immediately prior to procedure a time out was called to verify the correct patient, procedure, equipment, support staff and site/side marked as required. Patient was prepped and draped in the usual sterile  fashion.   Large Joint Inj: L subacromial bursa on 02/24/2021 2:49 PM Indications: pain and diagnostic evaluation Details: 22 G 1.5 in needle  Arthrogram: No  Medications: 3 mL lidocaine 1 %; 40 mg methylPREDNISolone acetate 40 MG/ML Outcome: tolerated well, no immediate complications Procedure, treatment alternatives, risks and benefits explained, specific risks discussed. Consent was given by the patient. Immediately prior to procedure a time out was called to verify the correct patient, procedure, equipment, support staff and site/side marked as required. Patient was prepped and draped in the usual sterile fashion.       Clinical Data: No additional findings.   Subjective: Chief Complaint  Patient presents with  . Left Shoulder - Pain  . Right Shoulder - Pain  The patient is here today mainly for steroid injections of both shoulders which she gets from time to time.  Is been over 3 months since her last steroid injections.  Her right shoulder hurts much more than her left shoulder.  She is also on chronic pain management.  We have not MRI that right shoulder ever.  She is also 5 months out from a left total knee arthroplasty.  HPI  Review of Systems Today she denies any fever, chills, nausea, vomiting  Objective: Vital Signs: There were no vitals taken for this visit.  Physical Exam She is alert and orient  x3 and in no acute distress Ortho Exam Examination of both shoulder shows she is a very thin.  Both shoulders move well but have global tenderness the arc of motion.  There is some more weakness on the right than the left.  Her left total knee arthroplasty has full flexion and full extension and is ligamentously stable. Specialty Comments:  No specialty comments available.  Imaging: No results found.   PMFS History: Patient Active Problem List   Diagnosis Date Noted  . Status post total left knee replacement 10/04/2020  . Unilateral primary osteoarthritis, left  knee 10/03/2020  . Status post total knee replacement, left 10/03/2020  . Constipation 09/25/2020  . Exocrine pancreatic insufficiency 09/25/2020  . Unilateral primary osteoarthritis, left hip 08/22/2020  . Chronic pain of left knee 08/22/2020  . Alcohol-induced chronic pancreatitis ([place]) 03/08/2020  . Epigastric pain 03/08/2020  . Impingement syndrome of both shoulders 08/04/2018  . Chronic tension-type headache, not intractable 12/02/2017  . Bilateral elbow joint pain 08/11/2017  . Chronic pain of both shoulders 08/11/2017  . Chronic fatigue syndrome with fibromyalgia 05/29/2015  . Back muscle spasm 04/05/2015  . Chronic migraine without aura without status migrainosus, not intractable 07/01/2013  . Depression 05/08/2013  . Lumbar facet arthropathy 04/13/2013  . Chronic, continuous use of opioids 01/12/2013  . DDD (degenerative disc disease), cervical 01/12/2013  . Raynaud's disease 01/12/2013  . Right knee pain 01/12/2013  . Scoliosis 01/12/2013  . Lesion of lip 05/30/2012  . Dysphonia 09/28/2011  . 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   Past Medical History:  Diagnosis Date  . Anxiety    states frequent  anxiety attacks  . Asthma   . Cancer (HCC)    HX OF THYROID CANCER   . Cold no drainage   since thursday 07-21-12, chest xray done 07-24-2012   . COPD (chronic obstructive pulmonary disease) ([place])    LOV with PFT 1/13  Dr Linde Gillis on chart-   pt states no changes since last saw pulmonary  . Depression   . Fibromyalgia    states has had trigger point injections shoulders-   . GERD (gastroesophageal reflux disease)   . Headache(784.0)    migraines  . History of DVT of lower extremity 1972   right leg  . Hyperlipidemia   . Hypothyroidism   . OA (osteoarthritis)   . On home oxygen therapy    2 liters per minute per [place] as needed  . Seasonal allergies    sgtates clear nasal drainage x 3- days- states no fever  . Shortness of breath    oxygen at 2  liters at night, WITH EXERTION     History reviewed. No pertinent family history.  Past Surgical History:  Procedure Laterality Date  . ABDOMINAL HYSTERECTOMY  1990   bilateral salpingo-oophorectomy  . [facility]  . BOWEL SURGERY FOR OBSTRUCTION  1992  . CLOSED MANIPULATION RIGHT KNEE REPLACEMENT  09-10-2010  . DIAGNOSTIC LAPAROSCOPY  1989   x2-3 with ovarian cystectomy  . FINGER ARTHROPLASTY  07/29/2012   Procedure: FINGER ARTHROPLASTY;  Surgeon: Magnus Sinning, MD;  Location: [facility];  Service: Orthopedics;  Laterality: Left;  Left Thumb, CMC Interpositional Arthroplasty Left Thumb,with Palmaris Longis Tendon Graft, IPSI Lateral     . HEMORRHOID SURGERY  1988  . KNEE ARTHROSCOPY  10-26-2008   AND REMOVAL 2 SCREWS PROXIMAL TIBIA  OF RIGHT KNEE  . KNEE ARTHROTOMY  LAST  ONE 2000   right x 3 prior to replacement  . LEFT SHOULDER ARTHROSCOPY  03-02-2012  . THYROID LOBECTOMY  2009   PARTIAL LEFT LOWER LOBECTOMY  . TONSILLECTOMY  1959  . TOTAL KNEE ARTHROPLASTY  07-31-2010   OA  RIGHT KNEE  . TOTAL KNEE ARTHROPLASTY Left 10/03/2020   Procedure: LEFT TOTAL KNEE ARTHROPLASTY;  Surgeon: Mcarthur Rossetti, MD;  Location: [facility];  Service: Orthopedics;  Laterality: Left;   Social History   Occupational History  . Not on file  Tobacco Use  . Smoking status: Current Every Day Smoker    Packs/day: 1.00    Years: 43.00    Pack years: 43.00    Types: Cigarettes  . Smokeless tobacco: Never Used  Vaping Use  . Vaping Use: Never used  Substance and Sexual Activity  . Alcohol use: No  . Drug use: No  . Sexual activity: Not on file

## 2021-03-20 ENCOUNTER — Other Ambulatory Visit: Payer: Medicare Other

## 2021-03-26 ENCOUNTER — Ambulatory Visit: Payer: Medicare Other | Admitting: Orthopaedic Surgery

## 2021-04-04 ENCOUNTER — Other Ambulatory Visit: Payer: Self-pay

## 2021-04-04 ENCOUNTER — Ambulatory Visit
Admission: RE | Admit: 2021-04-04 | Discharge: 2021-04-04 | Disposition: A | Discharge status: Home / Self Care | Payer: Medicare Other | Source: Ambulatory Visit | Attending: Orthopaedic Surgery | Admitting: Orthopaedic Surgery

## 2021-04-04 DIAGNOSIS — Z96611 Presence of right artificial shoulder joint: Secondary | ICD-10-CM

## 2021-06-25 ENCOUNTER — Ambulatory Visit: Payer: Medicare Other | Admitting: Physician Assistant

## 2021-07-14 ENCOUNTER — Ambulatory Visit (INDEPENDENT_AMBULATORY_CARE_PROVIDER_SITE_OTHER): Payer: Medicare Other | Admitting: Orthopaedic Surgery

## 2021-07-14 DIAGNOSIS — M25512 Pain in left shoulder: Secondary | ICD-10-CM

## 2021-07-14 DIAGNOSIS — M25511 Pain in right shoulder: Secondary | ICD-10-CM | POA: Diagnosis not present

## 2021-07-14 DIAGNOSIS — G8929 Other chronic pain: Secondary | ICD-10-CM

## 2021-07-14 MED ORDER — METHYLPREDNISOLONE ACETATE 40 MG/ML IJ SUSP
40.0000 mg | INTRAMUSCULAR | Status: AC | PRN
  Administered 2021-07-14: 40 mg via INTRA_ARTICULAR

## 2021-07-14 MED ORDER — LIDOCAINE HCL 1 % IJ SOLN
3.0000 mL | INTRAMUSCULAR | Status: AC | PRN
  Administered 2021-07-14: 3 mL

## 2021-07-14 NOTE — Progress Notes (Signed)
Office Visit Note   Patient: [name]           Date of Birth: 05-Jul-1951           MRN: [id number] Visit Date: 07/14/2021              Requested by: Kelton Pillar, MD [place] [address] [place],  [place] [address] PCP: Kelton Pillar, MD   Assessment & Plan: Visit Diagnoses:  1. Chronic pain of both shoulders     Plan: Per the patient's request I did provide a subacromial steroid injection in both shoulders which she tolerated well.  We can certainly repeat these in 4 months.  Follow-Up Instructions: Return in about 4 months (around 11/13/2021).   Orders:  Orders Placed This Encounter  Procedures   Large Joint Inj   No orders of the defined types were placed in this encounter.     Procedures: Large Joint Inj: bilateral subacromial bursa on 07/14/2021 3:26 PM Indications: pain and diagnostic evaluation Details: 22 G 1.5 in needle  Arthrogram: No  Medications (Right): 3 mL lidocaine 1 %; 40 mg methylPREDNISolone acetate 40 MG/ML Medications (Left): 3 mL lidocaine 1 %; 40 mg methylPREDNISolone acetate 40 MG/ML Outcome: tolerated well, no immediate complications Procedure, treatment alternatives, risks and benefits explained, specific risks discussed. Consent was given by the patient. Immediately prior to procedure a time out was called to verify the correct patient, procedure, equipment, support staff and site/side marked as required. Patient was prepped and draped in the usual sterile fashion.      Clinical Data: No additional findings.   Subjective: Chief Complaint  Patient presents with   Left Shoulder - Pain   Right Shoulder - Pain  The patient comes in today requesting bilateral shoulder injections.  She has chronic pain in both shoulders and MRI shows well-documented osteoarthritis of the right shoulder and rotator cuff tendinitis.  She is in chronic pain management as well.  The steroid injections last for about 2 months or so for her.  She  has had no other acute change in her medical status.  HPI  Review of Systems   Objective: Vital Signs: There were no vitals taken for this visit.  Physical Exam She is alert and oriented and in no acute distress Ortho Exam Both shoulders are well located.  She is a very thin and cachectic individual.  Both shoulders have significant pain throughout the arc of motion and a lot of guarding with pain.  There is no blocks to rotation. Specialty Comments:  No specialty comments available.  Imaging: No results found.   PMFS History: Patient Active Problem List   Diagnosis Date Noted   Status post total left knee replacement 10/04/2020   Unilateral primary osteoarthritis, left knee 10/03/2020   Status post total knee replacement, left 10/03/2020   Constipation 09/25/2020   Exocrine pancreatic insufficiency 09/25/2020   Unilateral primary osteoarthritis, left hip 08/22/2020   Chronic pain of left knee 08/22/2020   Alcohol-induced chronic pancreatitis ([place]) 03/08/2020   Epigastric pain 03/08/2020   Impingement syndrome of both shoulders 08/04/2018   Chronic tension-type headache, not intractable 12/02/2017   Bilateral elbow joint pain 08/11/2017   Chronic pain of both shoulders 08/11/2017   Chronic fatigue syndrome with fibromyalgia 05/29/2015   Back muscle spasm 04/05/2015   Chronic migraine without aura without status migrainosus, not intractable 07/01/2013   Depression 05/08/2013   Lumbar facet arthropathy 04/13/2013   Chronic, continuous use of opioids 01/12/2013  DDD (degenerative disc disease), cervical 01/12/2013   Raynaud's disease 01/12/2013   Right knee pain 01/12/2013   Scoliosis 01/12/2013   Lesion of lip 05/30/2012   Dysphonia 09/28/2011   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   Past Medical History:  Diagnosis Date   Anxiety    states frequent  anxiety attacks   Asthma    Cancer ([place])    HX OF THYROID CANCER    Cold no drainage   since  thursday 07-21-12, chest xray done 07-24-2012    COPD (chronic obstructive pulmonary disease) ([place])    LOV with PFT 1/13  Dr Linde Gillis on chart-   pt states no changes since last saw pulmonary   Depression    Fibromyalgia    states has had trigger point injections shoulders-    GERD (gastroesophageal reflux disease)    Headache(784.0)    migraines   History of DVT of lower extremity 1972   right leg   Hyperlipidemia    Hypothyroidism    OA (osteoarthritis)    On home oxygen therapy    2 liters per minute per [place] as needed   Seasonal allergies    sgtates clear nasal drainage x 3- days- states no fever   Shortness of breath    oxygen at 2 liters at night, WITH EXERTION     No family history on file.  Past Surgical History:  Procedure Laterality Date   ABDOMINAL HYSTERECTOMY  1990   bilateral salpingo-oophorectomy   BARTHOLIN GLAND CYST EXCISION  1977   PRIOR MARSIPULIZATION IN 1974   BOWEL SURGERY FOR OBSTRUCTION  1992   CLOSED MANIPULATION RIGHT KNEE REPLACEMENT  09-10-2010   DIAGNOSTIC LAPAROSCOPY  1989   x2-3 with ovarian cystectomy   FINGER ARTHROPLASTY  07/29/2012   Procedure: FINGER ARTHROPLASTY;  Surgeon: Magnus Sinning, MD;  Location: [facility];  Service: Orthopedics;  Laterality: Left;  Left Thumb, CMC Interpositional Arthroplasty Left Thumb,with Palmaris Longis Tendon Graft, IPSI Lateral      HEMORRHOID SURGERY  1988   KNEE ARTHROSCOPY  10-26-2008   AND REMOVAL 2 SCREWS PROXIMAL TIBIA  OF RIGHT KNEE   KNEE ARTHROTOMY  LAST ONE 2000   right x 3 prior to replacement   LEFT SHOULDER ARTHROSCOPY  03-02-2012   THYROID LOBECTOMY  2009   PARTIAL LEFT LOWER LOBECTOMY   TONSILLECTOMY  1959   TOTAL KNEE ARTHROPLASTY  07-31-2010   OA  RIGHT KNEE   TOTAL KNEE ARTHROPLASTY Left 10/03/2020   Procedure: LEFT TOTAL KNEE ARTHROPLASTY;  Surgeon: Mcarthur Rossetti, MD;  Location: [facility];  Service: Orthopedics;  Laterality: Left;   Social History   Occupational History    Not on file  Tobacco Use   Smoking status: Every Day    Packs/day: 1.00    Years: 43.00    Pack years: 43.00    Types: Cigarettes   Smokeless tobacco: Never  Vaping Use   Vaping Use: Never used  Substance and Sexual Activity   Alcohol use: No   Drug use: No   Sexual activity: Not on file

## 2021-08-27 ENCOUNTER — Ambulatory Visit: Payer: Medicare Other | Admitting: Orthopaedic Surgery

## 2021-09-08 ENCOUNTER — Telehealth: Payer: Self-pay | Admitting: Orthopaedic Surgery

## 2021-09-08 NOTE — Telephone Encounter (Signed)
P called and states that she wants to know if she could possibly get injection a little earlier than December. Last injection was 8/22 and it is suppose to be every 4 months

## 2021-09-09 NOTE — Telephone Encounter (Signed)
Called pt to get scheduled for injection, lm.

## 2021-10-20 ENCOUNTER — Ambulatory Visit (INDEPENDENT_AMBULATORY_CARE_PROVIDER_SITE_OTHER): Payer: Medicare Other | Admitting: Orthopaedic Surgery

## 2021-10-20 ENCOUNTER — Other Ambulatory Visit: Payer: Self-pay

## 2021-10-20 DIAGNOSIS — M25511 Pain in right shoulder: Secondary | ICD-10-CM | POA: Diagnosis not present

## 2021-10-20 DIAGNOSIS — M25512 Pain in left shoulder: Secondary | ICD-10-CM

## 2021-10-20 DIAGNOSIS — G8929 Other chronic pain: Secondary | ICD-10-CM

## 2021-10-20 MED ORDER — LIDOCAINE HCL 1 % IJ SOLN
3.0000 mL | INTRAMUSCULAR | Status: AC | PRN
  Administered 2021-10-20: 15:00:00 3 mL

## 2021-10-20 MED ORDER — METHYLPREDNISOLONE ACETATE 40 MG/ML IJ SUSP
40.0000 mg | INTRAMUSCULAR | Status: AC | PRN
  Administered 2021-10-20: 15:00:00 40 mg via INTRA_ARTICULAR

## 2021-10-20 MED ORDER — LIDOCAINE HCL 1 % IJ SOLN
3.0000 mL | INTRAMUSCULAR | Status: AC | PRN
Start: 2021-10-20 — End: 2021-10-20
  Administered 2021-10-20: 15:00:00 3 mL

## 2021-10-20 NOTE — Progress Notes (Signed)
Office Visit Note   Patient: [name]           Date of Birth: July 25, 1951           MRN: [id number] Visit Date: 10/20/2021              Requested by: Kelton Pillar, MD [place] [address] [place],   [address] PCP: Kelton Pillar, MD   Assessment & Plan: Visit Diagnoses:  1. Chronic pain of both shoulders     Plan: Per the patient's request of the provide a steroid injection in both shoulder subacromial outlets which she tolerated well.  Of note her left total knee is doing excellent and her range of motion is excellent with that left knee.  Her right knee started to have some problems.  We can always reinject her shoulders again in 3 months.  Follow-Up Instructions: Return in about 3 months (around 01/20/2022).   Orders:  Orders Placed This Encounter  Procedures   Large Joint Inj   Large Joint Inj   No orders of the defined types were placed in this encounter.     Procedures: Large Joint Inj: R subacromial bursa on 10/20/2021 2:36 PM Indications: pain and diagnostic evaluation Details: 22 G 1.5 in needle  Arthrogram: No  Medications: 3 mL lidocaine 1 %; 40 mg methylPREDNISolone acetate 40 MG/ML Outcome: tolerated well, no immediate complications Procedure, treatment alternatives, risks and benefits explained, specific risks discussed. Consent was given by the patient. Immediately prior to procedure a time out was called to verify the correct patient, procedure, equipment, support staff and site/side marked as required. Patient was prepped and draped in the usual sterile fashion.    Large Joint Inj: L subacromial bursa on 10/20/2021 2:36 PM Indications: pain and diagnostic evaluation Details: 22 G 1.5 in needle  Arthrogram: No  Medications: 3 mL lidocaine 1 %; 40 mg methylPREDNISolone acetate 40 MG/ML Outcome: tolerated well, no immediate complications Procedure, treatment alternatives, risks and benefits explained, specific risks discussed.  Consent was given by the patient. Immediately prior to procedure a time out was called to verify the correct patient, procedure, equipment, support staff and site/side marked as required. Patient was prepped and draped in the usual sterile fashion.      Clinical Data: No additional findings.   Subjective: Chief Complaint  Patient presents with   Left Shoulder - Pain   Right Shoulder - Pain  The patient is a 70 year old female well-known to me.  She has chronic bilateral shoulder pain tendinitis and comes in every 3 to 4 months for steroid injections in her shoulders.  We have actually replaced her left knee and that is done very well.  Her right knee has been bothering her some but she is requesting steroid injections in both shoulders today.  She is on chronic narcotic pain medications and pushes through the pain is much as she can.  She has had no acute change in her medical status.  HPI  Review of Systems There is listed no fever, chills, nausea, vomiting  Objective: Vital Signs: There were no vitals taken for this visit.  Physical Exam She is alert and orient x3 and in no acute distress Ortho Exam Examination of both shoulder shows she is a very thin and cachectic individual.  Both shoulders are located but have painful arc of motion. Specialty Comments:  No specialty comments available.  Imaging: No results found.   PMFS History: Patient Active Problem List   Diagnosis Date  Noted   Status post total left knee replacement 10/04/2020   Unilateral primary osteoarthritis, left knee 10/03/2020   Status post total knee replacement, left 10/03/2020   Constipation 09/25/2020   Exocrine pancreatic insufficiency 09/25/2020   Unilateral primary osteoarthritis, left hip 08/22/2020   Chronic pain of left knee 08/22/2020   Alcohol-induced chronic pancreatitis ([place]) 03/08/2020   Epigastric pain 03/08/2020   Impingement syndrome of both shoulders 08/04/2018   Chronic tension-type  headache, not intractable 12/02/2017   Bilateral elbow joint pain 08/11/2017   Chronic pain of both shoulders 08/11/2017   Chronic fatigue syndrome with fibromyalgia 05/29/2015   Back muscle spasm 04/05/2015   Chronic migraine without aura without status migrainosus, not intractable 07/01/2013   Depression 05/08/2013   Lumbar facet arthropathy 04/13/2013   Chronic, continuous use of opioids 01/12/2013   DDD (degenerative disc disease), cervical 01/12/2013   Raynaud's disease 01/12/2013   Right knee pain 01/12/2013   Scoliosis 01/12/2013   Lesion of lip 05/30/2012   Dysphonia 09/28/2011   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   Past Medical History:  Diagnosis Date   Anxiety    states frequent  anxiety attacks   Asthma    Cancer ([place])    HX OF THYROID CANCER    Cold no drainage   since thursday 07-21-12, chest xray done 07-24-2012    COPD (chronic obstructive pulmonary disease) ([place])    LOV with PFT 1/13  Dr Linde Gillis on chart-   pt states no changes since last saw pulmonary   Depression    Fibromyalgia    states has had trigger point injections shoulders-    GERD (gastroesophageal reflux disease)    Headache(784.0)    migraines   History of DVT of lower extremity 1972   right leg   Hyperlipidemia    Hypothyroidism    OA (osteoarthritis)    On home oxygen therapy    2 liters per minute per [place] as needed   Seasonal allergies    sgtates clear nasal drainage x 3- days- states no fever   Shortness of breath    oxygen at 2 liters at night, WITH EXERTION     No family history on file.  Past Surgical History:  Procedure Laterality Date   ABDOMINAL HYSTERECTOMY  1990   bilateral salpingo-oophorectomy   BARTHOLIN GLAND CYST EXCISION  1977   PRIOR MARSIPULIZATION IN 1974   BOWEL SURGERY FOR OBSTRUCTION  1992   CLOSED MANIPULATION RIGHT KNEE REPLACEMENT  09-10-2010   DIAGNOSTIC LAPAROSCOPY  1989   x2-3 with ovarian cystectomy   FINGER ARTHROPLASTY  07/29/2012    Procedure: FINGER ARTHROPLASTY;  Surgeon: Magnus Sinning, MD;  Location: [facility];  Service: Orthopedics;  Laterality: Left;  Left Thumb, CMC Interpositional Arthroplasty Left Thumb,with Palmaris Longis Tendon Graft, IPSI Lateral      HEMORRHOID SURGERY  1988   KNEE ARTHROSCOPY  10-26-2008   AND REMOVAL 2 SCREWS PROXIMAL TIBIA  OF RIGHT KNEE   KNEE ARTHROTOMY  LAST ONE 2000   right x 3 prior to replacement   LEFT SHOULDER ARTHROSCOPY  03-02-2012   THYROID LOBECTOMY  2009   PARTIAL LEFT LOWER LOBECTOMY   TONSILLECTOMY  1959   TOTAL KNEE ARTHROPLASTY  07-31-2010   OA  RIGHT KNEE   TOTAL KNEE ARTHROPLASTY Left 10/03/2020   Procedure: LEFT TOTAL KNEE ARTHROPLASTY;  Surgeon: Mcarthur Rossetti, MD;  Location: [facility];  Service: Orthopedics;  Laterality: Left;   Social History   Occupational  History   Not on file  Tobacco Use   Smoking status: Every Day    Packs/day: 1.00    Years: 43.00    Pack years: 43.00    Types: Cigarettes   Smokeless tobacco: Never  Vaping Use   Vaping Use: Never used  Substance and Sexual Activity   Alcohol use: No   Drug use: No   Sexual activity: Not on file

## 2021-11-13 ENCOUNTER — Ambulatory Visit: Payer: Medicare Other | Admitting: Orthopaedic Surgery

## 2021-12-31 ENCOUNTER — Other Ambulatory Visit: Payer: Self-pay

## 2021-12-31 ENCOUNTER — Ambulatory Visit (INDEPENDENT_AMBULATORY_CARE_PROVIDER_SITE_OTHER): Payer: Medicare Other | Admitting: Cardiovascular Disease

## 2021-12-31 ENCOUNTER — Encounter: Payer: Self-pay | Admitting: Cardiovascular Disease

## 2021-12-31 DIAGNOSIS — R0789 Other chest pain: Secondary | ICD-10-CM | POA: Diagnosis not present

## 2021-12-31 DIAGNOSIS — E782 Mixed hyperlipidemia: Secondary | ICD-10-CM

## 2021-12-31 DIAGNOSIS — E785 Hyperlipidemia, unspecified: Secondary | ICD-10-CM | POA: Insufficient documentation

## 2021-12-31 DIAGNOSIS — Z720 Tobacco use: Secondary | ICD-10-CM | POA: Insufficient documentation

## 2021-12-31 NOTE — Assessment & Plan Note (Signed)
[name] is complained of atypical chest pain for the last 8 to 12 months.  It is fairly localized to a small area and on her left anterior chest.  It occasionally goes into her left shoulder.  It is exacerbated by different positions.  Not brought on by activity.  It does not sound anginal.  She does have fibromyalgia.  I am going to get a coronary calcium score to risk stratify.

## 2021-12-31 NOTE — Progress Notes (Signed)
12/31/2021 Samantha Fus   [birth year]  [id number]  Primary Physician Kelton Pillar, MD Primary Cardiologist: Lorretta Harp MD FACP, [place], [place], [place]  HPI:  [name] is a 71 y.o. thin, frail and chronically ill-appearing married Caucasian female with no children who is referred for evaluation of atypical chest pain.  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  She drank heavily until 2003 by her own account.  She has a long history tobacco use having smoked greater than 100 pack years and stopped on 09/23/2021.  She is hyperlipidemia on statin therapy as well as family history of heart disease with a father who had an MI at age 10 and a brother who died of heart disease as well.  She has severe fibromyalgia and Raynaud's disease.  She is complaining of localized left upper chest pain for the last 8 to 12 months with occasional left upper extremity radiation.  It is exacerbated by various positions as well.   Current Meds  Medication Sig   albuterol (PROVENTIL HFA;VENTOLIN HFA) 108 (90 BASE) MCG/ACT inhaler Inhale 2 puffs into the lungs every 6 (six) hours as needed for wheezing or shortness of breath.    Ascorbic Acid (VITAMIN C) 1000 MG tablet Take 1,000 mg by mouth 2 (two) times daily.   aspirin 325 MG EC tablet TAKE 1 TABLET BY MOUTH TWICE A DAY AFTER MEAL   atorvastatin (LIPITOR) 20 MG tablet Take 10 mg by mouth daily before breakfast.    B Complex-C (B-COMPLEX WITH VITAMIN C) tablet Take 1 tablet by mouth daily.   buPROPion (WELLBUTRIN SR) 150 MG 12 hr tablet Take 450 mg by mouth daily before breakfast.    butalbital-acetaminophen-caffeine (FIORICET, ESGIC) 50-325-40 MG per tablet Take 1 tablet by mouth 2 (two) times daily as needed for headache or migraine.    Calcium Carbonate-Vitamin D (CALCIUM + D PO) Take 1 tablet by mouth 2 (two) times daily.   clonazePAM (KLONOPIN) 0.5 MG tablet Take  0.25-0.5 mg by mouth 3 (three) times daily as needed for anxiety.    DULoxetine (CYMBALTA) 30 MG capsule Take 30 mg by mouth 2 (two) times daily.   estradiol (ESTRACE) 1 MG tablet Take 1 mg by mouth daily.    Famotidine (PEPCID PO) Take by mouth.   famotidine (PEPCID) 40 MG tablet Take by mouth.   fentaNYL (DURAGESIC) 50 MCG/HR Place 1 patch onto the skin every other day.    fexofenadine (ALLEGRA) 180 MG tablet Take 180 mg by mouth daily.   fluticasone (FLONASE) 50 MCG/ACT nasal spray Place 1 spray into both nostrils daily as needed for allergies.    Fluticasone-Umeclidin-Vilant 100-62.5-25 MCG/INH AEPB Inhale 1 puff into the lungs daily.    gabapentin (NEURONTIN) 300 MG capsule Take 300 mg by mouth 2 (two) times daily.   glycopyrrolate (ROBINUL) 1 MG tablet Take 1 mg by mouth at bedtime.   HYDROmorphone (DILAUDID) 4 MG tablet Take 1 tablet (4 mg total) by mouth every 4 (four) hours as needed for severe pain (if oxycodone not effective). (Patient taking differently: Take 2 mg by mouth every 4 (four) hours as needed for severe pain (if oxycodone not effective).)   levothyroxine (SYNTHROID, LEVOTHROID) 25 MCG tablet Take 25 mcg by mouth daily before breakfast.   naratriptan (AMERGE) 2.5 MG tablet Take 2.5 mg by mouth as needed for migraine.    NARCAN 4 MG/0.1ML LIQD nasal spray  kit Place 0.4 mg into the nose once.    pilocarpine (SALAGEN) 5 MG tablet Take 5 mg by mouth 2 (two) times daily.    rOPINIRole (REQUIP) 2 MG tablet Take 4 mg by mouth at bedtime.   traZODone (DESYREL) 50 MG tablet Take 50-100 mg by mouth at bedtime.   valACYclovir (VALTREX) 1000 MG tablet Take 1,000 mg by mouth 2 (two) times daily as needed (outbreak).    verapamil (CALAN) 40 MG tablet Take 40 mg by mouth 2 (two) times daily.   [DISCONTINUED] erythromycin ophthalmic ointment Place 1 application into the left eye at bedtime.   [DISCONTINUED] escitalopram (LEXAPRO) 20 MG tablet Take 20 mg by mouth every morning.    [DISCONTINUED] gentamicin-prednisoLONE 0.3-1 % ophthalmic drops Place 1 drop into the left eye daily.   [DISCONTINUED] RABEprazole (ACIPHEX) 20 MG tablet Take 20 mg by mouth daily.     Allergies  Allergen Reactions   Codeine Nausea And Vomiting   Doxycycline Nausea And Vomiting   Penicillins Other (See Comments)    Family cannot take / update 03/02/12 - pt. does not remember the reaction.   Sulfa Antibiotics Nausea And Vomiting   Tylenol [Acetaminophen] Other (See Comments)    Cannot take with Neurontin / update 03/02/12 - causes diarrhea   Vistaril [Hydroxyzine Hcl] Other (See Comments)     Hallucinations    Social History   Socioeconomic History   Marital status: Married    Spouse name: Not on file   Number of children: Not on file   Years of education: Not on file   Highest education level: Not on file  Occupational History   Not on file  Tobacco Use   Smoking status: Every Day    Packs/day: 1.00    Years: 43.00    Pack years: 43.00    Types: Cigarettes   Smokeless tobacco: Never  Vaping Use   Vaping Use: Never used  Substance and Sexual Activity   Alcohol use: No   Drug use: No   Sexual activity: Not on file  Other Topics Concern   Not on file  Social History Narrative   Not on file   Social Determinants of Health   Financial Resource Strain: Not on file  Food Insecurity: Not on file  Transportation Needs: Not on file  Physical Activity: Not on file  Stress: Not on file  Social Connections: Not on file  Intimate Partner Violence: Not on file     Review of Systems: General: negative for chills, fever, night sweats or weight changes.  Cardiovascular: negative for chest pain, dyspnea on exertion, edema, orthopnea, palpitations, paroxysmal nocturnal dyspnea or shortness of breath Dermatological: negative for rash Respiratory: negative for cough or wheezing Urologic: negative for hematuria Abdominal: negative for nausea, vomiting, diarrhea, bright red blood  per rectum, melena, or hematemesis Neurologic: negative for visual changes, syncope, or dizziness All other systems reviewed and are otherwise negative except as noted above.    Blood pressure 118/62, pulse 85, height _0  (1.575 m), weight 122 lb 3.2 oz (55.4 kg), SpO2 95 %.  General appearance: alert and no distress Neck: no adenopathy, no carotid bruit, no JVD, supple, symmetrical, trachea midline, and thyroid not enlarged, symmetric, no tenderness/mass/nodules Lungs: clear to auscultation bilaterally Heart: regular rate and rhythm, S1, S2 normal, no murmur, click, rub or gallop Extremities: extremities normal, atraumatic, no cyanosis or edema Pulses: 2+ and symmetric Skin: Skin color, texture, turgor normal. No rashes or lesions Neurologic: Grossly normal  EKG sinus rhythm at 84 with septal Q waves and low limb voltage.  I personally reviewed this EKG.  ASSESSMENT AND PLAN:   Tobacco abuse Greater than 100-pack-year tobacco abuse having quit 09/23/2021.  Hyperlipidemia History of hyperlipidemia on statin therapy with lipid profile performed 08/06/2021 revealing total cholesterol 183, LDL of 84 and HDL 75.  Atypical chest pain Ms. Gaspari is complained of atypical chest pain for the last 8 to 12 months.  It is fairly localized to a small area and on her left anterior chest.  It occasionally goes into her left shoulder.  It is exacerbated by different positions.  Not brought on by activity.  It does not sound anginal.  She does have fibromyalgia.  I am going to get a coronary calcium score to risk stratify.     Lorretta Harp MD FACP,FACC,FAHA, [facility] 12/31/2021 4:23 PM

## 2021-12-31 NOTE — Patient Instructions (Signed)
Medication Instructions:  Your physician recommends that you continue on your current medications as directed. Please refer to the Current Medication list given to you today.  *If you need a refill on your cardiac medications before your next appointment, please call your pharmacy*    Testing/Procedures: Dr. Gwenlyn Found has ordered a CT coronary calcium score.   Test locations:  [facility] ([address] [place], [place] [address]) [facility] ([address] [place], [place] [address])   This is $99 out of pocket.   Coronary CalciumScan A coronary calcium scan is an imaging test used to look for deposits of calcium and other fatty materials (plaques) in the inner lining of the blood vessels of the heart (coronary arteries). These deposits of calcium and plaques can partly clog and narrow the coronary arteries without producing any symptoms or warning signs. This puts a person at risk for a heart attack. This test can detect these deposits before symptoms develop. Tell a health care provider about: Any allergies you have. All medicines you are taking, including vitamins, herbs, eye drops, creams, and over-the-counter medicines. Any problems you or family members have had with anesthetic medicines. Any blood disorders you have. Any surgeries you have had. Any medical conditions you have. Whether you are pregnant or may be pregnant. What are the risks? Generally, this is a safe procedure. However, problems may occur, including: Harm to a pregnant woman and her unborn baby. This test involves the use of radiation. Radiation exposure can be dangerous to a pregnant woman and her unborn baby. If you are pregnant, you generally should not have this procedure done. Slight increase in the risk of cancer. This is because of the radiation involved in the test. What happens before the procedure? No preparation is needed for this procedure. What happens during the procedure? You  will undress and remove any jewelry around your neck or chest. You will put on a hospital gown. Sticky electrodes will be placed on your chest. The electrodes will be connected to an electrocardiogram (ECG) machine to record a tracing of the electrical activity of your heart. A CT scanner will take pictures of your heart. During this time, you will be asked to lie still and hold your breath for 2-3 seconds while a picture of your heart is being taken. The procedure may vary among health care providers and hospitals. What happens after the procedure? You can get dressed. You can return to your normal activities. It is up to you to get the results of your test. Ask your health care provider, or the department that is doing the test, when your results will be ready. Summary A coronary calcium scan is an imaging test used to look for deposits of calcium and other fatty materials (plaques) in the inner lining of the blood vessels of the heart (coronary arteries). Generally, this is a safe procedure. Tell your health care provider if you are pregnant or may be pregnant. No preparation is needed for this procedure. A CT scanner will take pictures of your heart. You can return to your normal activities after the scan is done. This information is not intended to replace advice given to you by your health care provider. Make sure you discuss any questions you have with your health care provider. Document Released: 05/07/2008 Document Revised: 09/28/2016 Document Reviewed: 09/28/2016 Elsevier Interactive Patient Education  2017 [place]: At [facility], you and your health needs are our priority.  As part of  our continuing mission to provide you with exceptional heart care, we have created designated Provider Care Teams.  These Care Teams include your primary Cardiologist (physician) and Advanced Practice Providers (APPs -  Physician Assistants and Nurse Practitioners) who all work  together to provide you with the care you need, when you need it.  We recommend signing up for the patient portal called "MyChart".  Sign up information is provided on this After Visit Summary.  MyChart is used to connect with patients for Virtual Visits (Telemedicine).  Patients are able to view lab/test results, encounter notes, upcoming appointments, etc.  Non-urgent messages can be sent to your provider as well.   To learn more about what you can do with MyChart, go to [email or web address].    Your next appointment:   We will see you on an as needed basis.    Provider:   Quay Burow, MD

## 2021-12-31 NOTE — Assessment & Plan Note (Signed)
Greater than 100-pack-year tobacco abuse having quit 09/23/2021.

## 2021-12-31 NOTE — Assessment & Plan Note (Signed)
History of hyperlipidemia on statin therapy with lipid profile performed 08/06/2021 revealing total cholesterol 183, LDL of 84 and HDL 75.

## 2022-01-08 ENCOUNTER — Other Ambulatory Visit: Payer: Self-pay

## 2022-01-08 ENCOUNTER — Ambulatory Visit (HOSPITAL_COMMUNITY)
Admission: RE | Admit: 2022-01-08 | Discharge: 2022-01-08 | Disposition: A | Discharge status: Home / Self Care | Payer: Self-pay | Source: Ambulatory Visit | Attending: Cardiovascular Disease | Admitting: Cardiovascular Disease

## 2022-01-08 DIAGNOSIS — E782 Mixed hyperlipidemia: Secondary | ICD-10-CM | POA: Insufficient documentation

## 2022-01-13 ENCOUNTER — Ambulatory Visit: Payer: Medicare Other | Admitting: Cardiovascular Disease

## 2022-01-21 ENCOUNTER — Encounter: Payer: Self-pay | Admitting: Orthopaedic Surgery

## 2022-01-21 ENCOUNTER — Ambulatory Visit (INDEPENDENT_AMBULATORY_CARE_PROVIDER_SITE_OTHER): Payer: Medicare Other | Admitting: Orthopaedic Surgery

## 2022-01-21 DIAGNOSIS — M25511 Pain in right shoulder: Secondary | ICD-10-CM | POA: Diagnosis not present

## 2022-01-21 DIAGNOSIS — M25512 Pain in left shoulder: Secondary | ICD-10-CM | POA: Diagnosis not present

## 2022-01-21 DIAGNOSIS — G8929 Other chronic pain: Secondary | ICD-10-CM

## 2022-01-21 MED ORDER — LIDOCAINE HCL 1 % IJ SOLN
3.0000 mL | INTRAMUSCULAR | Status: AC | PRN
  Administered 2022-01-21: 3 mL

## 2022-01-21 MED ORDER — METHYLPREDNISOLONE ACETATE 40 MG/ML IJ SUSP
40.0000 mg | INTRAMUSCULAR | Status: AC | PRN
  Administered 2022-01-21: 40 mg via INTRA_ARTICULAR

## 2022-01-21 NOTE — Progress Notes (Signed)
? ?Office Visit Note ?  ?Patient: [name]           ?Date of Birth: Apr 28, 1951           ?MRN: [id number] ?Visit Date: 01/21/2022 ?             ?Requested by: Kelton Pillar, MD ?[place] ?[address] ?[place],  [place] [address] ?PCP: Kelton Pillar, MD ? ? ?Assessment & Plan: ?Visit Diagnoses: No diagnosis found. ? ?Plan: I did place steroid injections in both shoulders in the subacromial space like we have done before numerous times.  She understands the risk and benefits of injections and tolerated them well.  She would like to have these again in 3 months. ? ?Follow-Up Instructions: Return in about 3 months (around 04/23/2022).  ? ?Orders:  ?Orders Placed This Encounter  ?Procedures  ? Large Joint Inj  ? Large Joint Inj  ? ?No orders of the defined types were placed in this encounter. ? ? ? ? Procedures: ?Large Joint Inj: R subacromial bursa on 01/21/2022 2:56 PM ?Indications: pain and diagnostic evaluation ?Details: 22 G 1.5 in needle ? ?Arthrogram: No ? ?Medications: 3 mL lidocaine 1 %; 40 mg methylPREDNISolone acetate 40 MG/ML ?Outcome: tolerated well, no immediate complications ?Procedure, treatment alternatives, risks and benefits explained, specific risks discussed. Consent was given by the patient. Immediately prior to procedure a time out was called to verify the correct patient, procedure, equipment, support staff and site/side marked as required. Patient was prepped and draped in the usual sterile fashion.  ? ? ?Large Joint Inj: L subacromial bursa on 01/21/2022 2:56 PM ?Indications: pain and diagnostic evaluation ?Details: 22 G 1.5 in needle ? ?Arthrogram: No ? ?Medications: 3 mL lidocaine 1 %; 40 mg methylPREDNISolone acetate 40 MG/ML ?Outcome: tolerated well, no immediate complications ?Procedure, treatment alternatives, risks and benefits explained, specific risks discussed. Consent was given by the patient. Immediately prior to procedure a time out was called to verify the correct patient,  procedure, equipment, support staff and site/side marked as required. Patient was prepped and draped in the usual sterile fashion.  ? ? ? ? ?Clinical Data: ?No additional findings. ? ? ?Subjective: ?Chief Complaint  ?Patient presents with  ? Right Shoulder - Pain  ? Left Shoulder - Pain  ?The patient is well-known to me.  She has chronic bilateral shoulder pain and comes in every 3 months for steroid injections in both her shoulders.  She says that the injections last about a month but she is not a surgical candidate nor does she want surgery.  She says spacing out every 3 months is fine.  She is not a diabetic.  She has had no acute change in her medical status.  She reports that she finally quit smoking 3 months ago.  She is 71 years old. ? ?HPI ? ?Review of Systems ?Today there is no listed fever, chills, nausea, vomiting ? ?Objective: ?Vital Signs: There were no vitals taken for this visit. ? ?Physical Exam ?She is alert and orient x3 and in no ?Ortho Exam ?Acute distress she is a thin individual in both shoulders are cachectic but have good range of motion but pain throughout the arc of motion. ?Specialty Comments:  ?No specialty comments available. ? ?Imaging: ?No results found. ? ? ?PMFS History: ?Patient Active Problem List  ? Diagnosis Date Noted  ? Hyperlipidemia 12/31/2021  ? Atypical chest pain 12/31/2021  ? Tobacco abuse 12/31/2021  ? 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  Unilateral primary osteoarthritis, left knee 10/03/2020  ? Status post total knee replacement, left 10/03/2020  ? Constipation 09/25/2020  ? 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 Unilateral primary osteoarthritis, left hip 08/22/2020  ? Chronic pain of left knee 08/22/2020  ? 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 Epigastric pain 03/08/2020  ? Impingement syndrome of both shoulders 08/04/2018  ? Chronic tension-type headache, not intractable 12/02/2017  ? Bilateral elbow joint pain 08/11/2017   ? Chronic pain of both shoulders 08/11/2017  ? Chronic fatigue syndrome with fibromyalgia 05/29/2015  ? Back muscle spasm 04/05/2015  ? Chronic migraine without aura without status migrainosus, not intractable 07/01/2013  ? Depression 05/08/2013  ? Lumbar facet arthropathy 04/13/2013  ? Chronic, continuous use of opioids 01/12/2013  ? DDD (degenerative disc disease), cervical 01/12/2013  ? Raynaud's disease 01/12/2013  ? Right knee pain 01/12/2013  ? Scoliosis 01/12/2013  ? Lesion of lip 05/30/2012  ? Dysphonia 09/28/2011  ? 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 ?Past Medical History:  ?Diagnosis Date  ? Anxiety   ? states frequent  anxiety attacks  ? Asthma   ? Cancer [facility])   ? HX OF THYROID CANCER   ? Cold no drainage  ? since thursday 07-21-12, chest xray done 07-24-2012   ? COPD (chronic obstructive pulmonary disease) ([place])   ? LOV with PFT 1/13  Dr Linde Gillis on chart-   pt states no changes since last saw pulmonary  ? Depression   ? Fibromyalgia   ? states has had trigger point injections shoulders-   ? GERD (gastroesophageal reflux disease)   ? Headache(784.0)   ? migraines  ? History of DVT of lower extremity 1972  ? right leg  ? Hyperlipidemia   ? Hypothyroidism   ? OA (osteoarthritis)   ? On home oxygen therapy   ? 2 liters per minute per [place] as needed  ? Seasonal allergies   ? sgtates clear nasal drainage x 3- days- states no fever  ? Shortness of breath   ? oxygen at 2 liters at night, WITH EXERTION   ?  ?No family history on file.  ?Past Surgical History:  ?Procedure Laterality Date  ? ABDOMINAL HYSTERECTOMY  1990  ? bilateral salpingo-oophorectomy  ? [place]  ? [place]  ? BOWEL SURGERY FOR OBSTRUCTION  1992  ? CLOSED MANIPULATION RIGHT KNEE REPLACEMENT  09-10-2010  ? DIAGNOSTIC LAPAROSCOPY  1989  ? x2-3 with ovarian cystectomy  ? FINGER ARTHROPLASTY  07/29/2012  ? Procedure: FINGER ARTHROPLASTY;  Surgeon: Magnus Sinning, MD;  Location:  [facility];  Service: Orthopedics;  Laterality: Left;  Left Thumb, CMC Interpositional Arthroplasty Left Thumb,with Palmaris Longis Tendon Graft, IPSI Lateral   ?  ? [place]  ? KNEE ARTHROSCOPY  10-26-2008  ? AND REMOVAL 2 SCREWS PROXIMAL TIBIA  OF RIGHT KNEE  ? KNEE ARTHROTOMY  LAST ONE 2000  ? right x 3 prior to replacement  ? LEFT SHOULDER ARTHROSCOPY  03-02-2012  ? THYROID LOBECTOMY  2009  ? PARTIAL LEFT LOWER LOBECTOMY  ? TONSILLECTOMY  1959  ? TOTAL KNEE ARTHROPLASTY  07-31-2010  ? OA  RIGHT KNEE  ? TOTAL KNEE ARTHROPLASTY Left 10/03/2020  ? Procedure: LEFT TOTAL KNEE ARTHROPLASTY;  Surgeon: Mcarthur Rossetti, MD;  Location: [facility];  Service: Orthopedics;  Laterality: Left;  ? ?Social History  ? ?Occupational History  ? Not on file  ?Tobacco Use  ? Smoking  status: Every Day  ?  Packs/day: 1.00  ?  Years: 43.00  ?  Pack years: 43.00  ?  Types: Cigarettes  ? Smokeless tobacco: Never  ?Vaping Use  ? Vaping Use: Never used  ?Substance and Sexual Activity  ? Alcohol use: No  ? Drug use: No  ? Sexual activity: Not on file  ? ? ? ? ? ? ?

## 2022-04-23 ENCOUNTER — Encounter: Payer: Self-pay | Admitting: Orthopaedic Surgery

## 2022-04-23 ENCOUNTER — Ambulatory Visit (INDEPENDENT_AMBULATORY_CARE_PROVIDER_SITE_OTHER): Payer: Medicare Other | Admitting: Orthopaedic Surgery

## 2022-04-23 DIAGNOSIS — M25511 Pain in right shoulder: Secondary | ICD-10-CM

## 2022-04-23 DIAGNOSIS — G8929 Other chronic pain: Secondary | ICD-10-CM

## 2022-04-23 DIAGNOSIS — M25512 Pain in left shoulder: Secondary | ICD-10-CM | POA: Diagnosis not present

## 2022-04-23 MED ORDER — METHYLPREDNISOLONE ACETATE 40 MG/ML IJ SUSP
40.0000 mg | INTRAMUSCULAR | Status: AC | PRN
  Administered 2022-04-23: 40 mg via INTRA_ARTICULAR

## 2022-04-23 MED ORDER — LIDOCAINE HCL 1 % IJ SOLN
3.0000 mL | INTRAMUSCULAR | Status: AC | PRN
  Administered 2022-04-23: 3 mL

## 2022-04-23 NOTE — Progress Notes (Signed)
Office Visit Note   Patient: [name]           Date of Birth: 1951/09/10           MRN: [id number] Visit Date: 04/23/2022              Requested by: Kelton Pillar, MD [place] [employer] [address],  [place] [address] PCP: Kelton Pillar, MD   Assessment & Plan: Visit Diagnoses:  1. Chronic pain of both shoulders     Plan: I was able to place a steroid injection in both shoulder cervical Alex which she tolerated well.  We can do this again in 3 months if she would like.  Follow-Up Instructions: Return in about 3 months (around 07/24/2022).   Orders:  Orders Placed This Encounter  Procedures   Large Joint Inj   Large Joint Inj   No orders of the defined types were placed in this encounter.     Procedures: Large Joint Inj: R subacromial bursa on 04/23/2022 1:02 PM Indications: pain and diagnostic evaluation Details: 22 G 1.5 in needle  Arthrogram: No  Medications: 3 mL lidocaine 1 %; 40 mg methylPREDNISolone acetate 40 MG/ML Outcome: tolerated well, no immediate complications Procedure, treatment alternatives, risks and benefits explained, specific risks discussed. Consent was given by the patient. Immediately prior to procedure a time out was called to verify the correct patient, procedure, equipment, support staff and site/side marked as required. Patient was prepped and draped in the usual sterile fashion.    Large Joint Inj: L subacromial bursa on 04/23/2022 1:02 PM Indications: pain and diagnostic evaluation Details: 22 G 1.5 in needle  Arthrogram: No  Medications: 3 mL lidocaine 1 %; 40 mg methylPREDNISolone acetate 40 MG/ML Outcome: tolerated well, no immediate complications Procedure, treatment alternatives, risks and benefits explained, specific risks discussed. Consent was given by the patient. Immediately prior to procedure a time out was called to verify the correct patient, procedure, equipment, support staff and site/side marked as required.  Patient was prepped and draped in the usual sterile fashion.      Clinical Data: No additional findings.   Subjective: Chief Complaint  Patient presents with   Left Shoulder - Follow-up   Right Shoulder - Follow-up  The patient is here with chronic bilateral shoulder pain requesting steroid injection of both shoulders.  She comes in every 3 months for these injections.  She is 71 years old.  These injections do help somewhat.  She also is in chronic pain management.  There is no significant deficit of the shoulders in terms of weakness but she does have chronic pain with the shoulders.  She has had no acute change in her medical status.  She has recently had radiofrequency ablation on the lumbar spine.  HPI  Review of Systems There is currently no fever, chills, nausea, vomiting  Objective: Vital Signs: There were no vitals taken for this visit.  Physical Exam She is alert and orient x3 and in no acute distress Ortho Exam Both shoulders are small with no significant muscle atrophy but painful arc of motion.  Both shoulders are well located. Specialty Comments:  No specialty comments available.  Imaging: No results found.   PMFS History: Patient Active Problem List   Diagnosis Date Noted   Hyperlipidemia 12/31/2021   Atypical chest pain 12/31/2021   Tobacco abuse 12/31/2021   Status post total left knee replacement 10/04/2020   Unilateral primary osteoarthritis, left knee 10/03/2020   Status post total  knee replacement, left 10/03/2020   Constipation 09/25/2020   Exocrine pancreatic insufficiency 09/25/2020   Unilateral primary osteoarthritis, left hip 08/22/2020   Chronic pain of left knee 08/22/2020   Alcohol-induced chronic pancreatitis ([place]) 03/08/2020   Epigastric pain 03/08/2020   Impingement syndrome of both shoulders 08/04/2018   Chronic tension-type headache, not intractable 12/02/2017   Bilateral elbow joint pain 08/11/2017   Chronic pain of both  shoulders 08/11/2017   Chronic fatigue syndrome with fibromyalgia 05/29/2015   Back muscle spasm 04/05/2015   Chronic migraine without aura without status migrainosus, not intractable 07/01/2013   Depression 05/08/2013   Lumbar facet arthropathy 04/13/2013   Chronic, continuous use of opioids 01/12/2013   DDD (degenerative disc disease), cervical 01/12/2013   Raynaud's disease 01/12/2013   Right knee pain 01/12/2013   Scoliosis 01/12/2013   Lesion of lip 05/30/2012   Dysphonia 09/28/2011   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   Past Medical History:  Diagnosis Date   Anxiety    states frequent  anxiety attacks   Asthma    Cancer ([place])    HX OF THYROID CANCER    Cold no drainage   since thursday 07-21-12, chest xray done 07-24-2012    COPD (chronic obstructive pulmonary disease) ([place])    LOV with PFT 1/13  Dr Linde Gillis on chart-   pt states no changes since last saw pulmonary   Depression    Fibromyalgia    states has had trigger point injections shoulders-    GERD (gastroesophageal reflux disease)    Headache(784.0)    migraines   History of DVT of lower extremity 1972   right leg   Hyperlipidemia    Hypothyroidism    OA (osteoarthritis)    On home oxygen therapy    2 liters per minute per  as needed   Seasonal allergies    sgtates clear nasal drainage x 3- days- states no fever   Shortness of breath    oxygen at 2 liters at night, WITH EXERTION     History reviewed. No pertinent family history.  Past Surgical History:  Procedure Laterality Date   ABDOMINAL HYSTERECTOMY  1990   bilateral salpingo-oophorectomy   BARTHOLIN GLAND CYST EXCISION  1977   PRIOR MARSIPULIZATION IN 1974   BOWEL SURGERY FOR OBSTRUCTION  1992   CLOSED MANIPULATION RIGHT KNEE REPLACEMENT  09-10-2010   DIAGNOSTIC LAPAROSCOPY  1989   x2-3 with ovarian cystectomy   FINGER ARTHROPLASTY  07/29/2012   Procedure: FINGER ARTHROPLASTY;  Surgeon: Magnus Sinning, MD;  Location: [facility]  [facility];  Service: Orthopedics;  Laterality: Left;  Left Thumb, CMC Interpositional Arthroplasty Left Thumb,with Palmaris Longis Tendon Graft, IPSI Lateral      HEMORRHOID SURGERY  1988   KNEE ARTHROSCOPY  10-26-2008   AND REMOVAL 2 SCREWS PROXIMAL TIBIA  OF RIGHT KNEE   KNEE ARTHROTOMY  LAST ONE 2000   right x 3 prior to replacement   LEFT SHOULDER ARTHROSCOPY  03-02-2012   THYROID LOBECTOMY  2009   PARTIAL LEFT LOWER LOBECTOMY   TONSILLECTOMY  1959   TOTAL KNEE ARTHROPLASTY  07-31-2010   OA  RIGHT KNEE   TOTAL KNEE ARTHROPLASTY Left 10/03/2020   Procedure: LEFT TOTAL KNEE ARTHROPLASTY;  Surgeon: Mcarthur Rossetti, MD;  Location: [facility];  Service: Orthopedics;  Laterality: Left;   Social History   Occupational History   Not on file  Tobacco Use   Smoking status: Every Day    Packs/day: 1.00  Years: 43.00    Pack years: 43.00    Types: Cigarettes   Smokeless tobacco: Never  Vaping Use   Vaping Use: Never used  Substance and Sexual Activity   Alcohol use: No   Drug use: No   Sexual activity: Not on file

## 2022-07-29 ENCOUNTER — Ambulatory Visit (INDEPENDENT_AMBULATORY_CARE_PROVIDER_SITE_OTHER): Payer: Medicare Other | Admitting: Orthopaedic Surgery

## 2022-07-29 ENCOUNTER — Encounter: Payer: Self-pay | Admitting: Orthopaedic Surgery

## 2022-07-29 DIAGNOSIS — M25512 Pain in left shoulder: Secondary | ICD-10-CM | POA: Diagnosis not present

## 2022-07-29 DIAGNOSIS — G8929 Other chronic pain: Secondary | ICD-10-CM | POA: Diagnosis not present

## 2022-07-29 DIAGNOSIS — M25511 Pain in right shoulder: Secondary | ICD-10-CM

## 2022-07-29 MED ORDER — LIDOCAINE HCL 1 % IJ SOLN
3.0000 mL | INTRAMUSCULAR | Status: AC | PRN
  Administered 2022-07-29: 3 mL

## 2022-07-29 MED ORDER — METHYLPREDNISOLONE ACETATE 40 MG/ML IJ SUSP
40.0000 mg | INTRAMUSCULAR | Status: AC | PRN
  Administered 2022-07-29: 40 mg via INTRA_ARTICULAR

## 2022-07-29 NOTE — Progress Notes (Signed)
The patient is a 71 year old female well-known to the clinic.  She has chronic pain in both her shoulders and comes in about every 3 months for steroid injections in the subacromial outlet of both shoulders.  There has not been any surgery recommended for her shoulders and this is a situation of chronic pain and inflammation.  She has had no acute change in her medical status and does request injections in both shoulders today.  A MRI of her right shoulder last year showed extensive cartilage loss of the glenohumeral joint and severe tendinosis of the rotator cuff.  Both shoulders move well but are very painful.  She is a thin individual.  I was able to place a steroid injection in the subacromial outlet of both shoulders very easily.  As always she would like to come back and see [place] in 3 months for repeat injections.  Procedure Note  Patient: [name]             Date of Birth: 1951/10/20           MRN: [id number]             Visit Date: 07/29/2022  Procedures: Visit Diagnoses:  1. Chronic pain of both shoulders     Large Joint Inj: L subacromial bursa on 07/29/2022 12:57 PM Indications: pain and diagnostic evaluation Details: 22 G 1.5 in needle  Arthrogram: No  Medications: 3 mL lidocaine 1 %; 40 mg methylPREDNISolone acetate 40 MG/ML Outcome: tolerated well, no immediate complications Procedure, treatment alternatives, risks and benefits explained, specific risks discussed. Consent was given by the patient. Immediately prior to procedure a time out was called to verify the correct patient, procedure, equipment, support staff and site/side marked as required. Patient was prepped and draped in the usual sterile fashion.    Large Joint Inj: L subacromial bursa on 07/29/2022 12:58 PM Indications: diagnostic evaluation Details: 22 G 1.5 in needle  Arthrogram: No  Medications: 3 mL lidocaine 1 %; 40 mg methylPREDNISolone acetate 40 MG/ML Outcome: tolerated well, no immediate  complications Procedure, treatment alternatives, risks and benefits explained, specific risks discussed. Consent was given by the patient. Immediately prior to procedure a time out was called to verify the correct patient, procedure, equipment, support staff and site/side marked as required. Patient was prepped and draped in the usual sterile fashion.

## 2022-10-28 ENCOUNTER — Ambulatory Visit (INDEPENDENT_AMBULATORY_CARE_PROVIDER_SITE_OTHER): Payer: Medicare Other | Admitting: Orthopaedic Surgery

## 2022-10-28 ENCOUNTER — Encounter: Payer: Self-pay | Admitting: Orthopaedic Surgery

## 2022-10-28 DIAGNOSIS — G8929 Other chronic pain: Secondary | ICD-10-CM | POA: Diagnosis not present

## 2022-10-28 DIAGNOSIS — M25512 Pain in left shoulder: Secondary | ICD-10-CM | POA: Diagnosis not present

## 2022-10-28 DIAGNOSIS — M25511 Pain in right shoulder: Secondary | ICD-10-CM

## 2022-10-28 MED ORDER — LIDOCAINE HCL 1 % IJ SOLN
3.0000 mL | INTRAMUSCULAR | Status: AC | PRN
  Administered 2022-10-28: 3 mL

## 2022-10-28 MED ORDER — METHYLPREDNISOLONE ACETATE 40 MG/ML IJ SUSP
40.0000 mg | INTRAMUSCULAR | Status: AC | PRN
  Administered 2022-10-28: 40 mg via INTRA_ARTICULAR

## 2022-10-28 NOTE — Progress Notes (Signed)
The patient is a 71 year old female with debilitating pain in both of her shoulders.  She comes in every 3 months for steroid injections.  She is requesting them again today.  She is on chronic pain medications and chronic pain management.  She has had no acute change in her medical status.  She is not a diabetic and is requesting steroid injections again today in her shoulders.  There is no blocks or rotation of either shoulder but she has pain throughout the arc of motion of both shoulders.  She is a very cachectic individual.  I did place steroid injections in both shoulder subacromial outlets like we do every 3 months.  She would still like to do this every 3 months.  I have offered her referral for surgical evaluation but she is deferring this for now.        Procedure Note  Patient: [name]             Date of Birth: 14-Feb-1951           MRN: [id number]             Visit Date: 10/28/2022  Procedures: Visit Diagnoses:  1. Chronic pain of both shoulders     Large Joint Inj: R subacromial bursa on 10/28/2022 2:27 PM Indications: pain and diagnostic evaluation Details: 22 G 1.5 in needle  Arthrogram: No  Medications: 3 mL lidocaine 1 %; 40 mg methylPREDNISolone acetate 40 MG/ML Outcome: tolerated well, no immediate complications Procedure, treatment alternatives, risks and benefits explained, specific risks discussed. Consent was given by the patient. Immediately prior to procedure a time out was called to verify the correct patient, procedure, equipment, support staff and site/side marked as required. Patient was prepped and draped in the usual sterile fashion.    Large Joint Inj: L subacromial bursa on 10/28/2022 2:27 PM Indications: pain and diagnostic evaluation Details: 22 G 1.5 in needle  Arthrogram: No  Medications: 3 mL lidocaine 1 %; 40 mg methylPREDNISolone acetate 40 MG/ML Outcome: tolerated well, no immediate complications Procedure, treatment alternatives,  risks and benefits explained, specific risks discussed. Consent was given by the patient. Immediately prior to procedure a time out was called to verify the correct patient, procedure, equipment, support staff and site/side marked as required. Patient was prepped and draped in the usual sterile fashion.

## 2023-01-27 ENCOUNTER — Ambulatory Visit (INDEPENDENT_AMBULATORY_CARE_PROVIDER_SITE_OTHER): Payer: Medicare Other | Admitting: Orthopaedic Surgery

## 2023-01-27 DIAGNOSIS — G8929 Other chronic pain: Secondary | ICD-10-CM

## 2023-01-27 DIAGNOSIS — M25512 Pain in left shoulder: Secondary | ICD-10-CM | POA: Diagnosis not present

## 2023-01-27 DIAGNOSIS — M25511 Pain in right shoulder: Secondary | ICD-10-CM | POA: Diagnosis not present

## 2023-01-27 MED ORDER — METHYLPREDNISOLONE ACETATE 40 MG/ML IJ SUSP
40.0000 mg | INTRAMUSCULAR | Status: AC | PRN
  Administered 2023-01-27: 40 mg via INTRA_ARTICULAR

## 2023-01-27 MED ORDER — LIDOCAINE HCL 1 % IJ SOLN
3.0000 mL | INTRAMUSCULAR | Status: AC | PRN
  Administered 2023-01-27: 3 mL

## 2023-01-27 NOTE — Progress Notes (Signed)
The patient is well-known to me.  She has chronic bilateral shoulder pain and a lot of this is arthritic related.  She has had both her knees replaced.  She comes in every 3 months for subacromial steroid injections.  She is a thin individual and at this point would like to consider the possibility of shoulder surgery.  She is requesting injections today.  It has been 3 months since her last steroid injections.  I did place a steroid injection in both subacromial outlets that she tolerated well.  She has actually really good range of motion of both shoulders.  She is very thin and cachectic.  I would like to send her to my partner Dr. Sammuel Hines so he can take a look at her shoulders and potentially recommend a surgical intervention if warranted for either shoulder.  She would like to have this referral as well.     Procedure Note  Patient: [name]             Date of Birth: Jan 12, 1951           MRN: [id number]             Visit Date: 01/27/2023  Procedures: Visit Diagnoses:  1. Chronic pain of both shoulders     Large Joint Inj: R subacromial bursa on 01/27/2023 2:26 PM Indications: pain and diagnostic evaluation Details: 22 G 1.5 in needle  Arthrogram: No  Medications: 3 mL lidocaine 1 %; 40 mg methylPREDNISolone acetate 40 MG/ML Outcome: tolerated well, no immediate complications Procedure, treatment alternatives, risks and benefits explained, specific risks discussed. Consent was given by the patient. Immediately prior to procedure a time out was called to verify the correct patient, procedure, equipment, support staff and site/side marked as required. Patient was prepped and draped in the usual sterile fashion.    Large Joint Inj: L subacromial bursa on 01/27/2023 2:26 PM Indications: pain and diagnostic evaluation Details: 22 G 1.5 in needle  Arthrogram: No  Medications: 3 mL lidocaine 1 %; 40 mg methylPREDNISolone acetate 40 MG/ML Outcome: tolerated well, no immediate  complications Procedure, treatment alternatives, risks and benefits explained, specific risks discussed. Consent was given by the patient. Immediately prior to procedure a time out was called to verify the correct patient, procedure, equipment, support staff and site/side marked as required. Patient was prepped and draped in the usual sterile fashion.

## 2023-02-12 ENCOUNTER — Ambulatory Visit (HOSPITAL_BASED_OUTPATIENT_CLINIC_OR_DEPARTMENT_OTHER): Payer: Medicare Other | Admitting: Orthopaedic Surgery

## 2023-03-10 ENCOUNTER — Ambulatory Visit (HOSPITAL_BASED_OUTPATIENT_CLINIC_OR_DEPARTMENT_OTHER): Payer: Medicare Other | Admitting: Orthopaedic Surgery

## 2023-03-24 ENCOUNTER — Ambulatory Visit (HOSPITAL_BASED_OUTPATIENT_CLINIC_OR_DEPARTMENT_OTHER): Payer: Medicare Other | Admitting: Orthopaedic Surgery

## 2023-04-16 ENCOUNTER — Other Ambulatory Visit: Payer: Self-pay | Admitting: Internal Medicine

## 2023-04-16 DIAGNOSIS — M810 Age-related osteoporosis without current pathological fracture: Secondary | ICD-10-CM

## 2023-05-12 ENCOUNTER — Ambulatory Visit (INDEPENDENT_AMBULATORY_CARE_PROVIDER_SITE_OTHER): Payer: Medicare Other | Admitting: Orthopaedic Surgery

## 2023-05-12 DIAGNOSIS — M12811 Other specific arthropathies, not elsewhere classified, right shoulder: Secondary | ICD-10-CM

## 2023-05-12 NOTE — Progress Notes (Signed)
Chief Complaint: Right shoulder pain     History of Present Illness:    [name] is a 72 y.o. female right-hand-dominant female presents with ongoing right shoulder pain that has been painful for the last year and worse over the last 3 months.  She is having pain with any type of overhead activity or range of motion.  She has been having multiple injections with Dr. Magnus Ivan which has given her relief although these are no longer giving her any type of significant relief.  She is on long-term narcotics from issues with her back.  She is scheduled to undergo radiofrequency ablation in July of this year.  She is having a difficult time reaching overhead or doing her hair.  She states that this has been really bothersome lately and she is no longer able to even lay on the side.    Surgical History:   none  PMH/PSH/Family History/Social History/Meds/Allergies:    Past Medical History:  Diagnosis Date   Anxiety    states frequent  anxiety attacks   Asthma    Cancer (HCC)    HX OF THYROID CANCER    Cold no drainage   since thursday 07-21-12, chest xray done 07-24-2012    COPD (chronic obstructive pulmonary disease) (HCC)    LOV with PFT 1/13  Dr Cherie Ouch on chart-   pt states no changes since last saw pulmonary   Depression    Fibromyalgia    states has had trigger point injections shoulders-    GERD (gastroesophageal reflux disease)    Headache(784.0)    migraines   History of DVT of lower extremity 1972   right leg   Hyperlipidemia    Hypothyroidism    OA (osteoarthritis)    On home oxygen therapy    2 liters per minute per [place] as needed   Seasonal allergies    sgtates clear nasal drainage x 3- days- states no fever   Shortness of breath    oxygen at 2 liters at night, WITH EXERTION    Past Surgical History:  Procedure Laterality Date   ABDOMINAL HYSTERECTOMY  1990   bilateral salpingo-oophorectomy   BARTHOLIN GLAND CYST  EXCISION  1977   PRIOR MARSIPULIZATION IN 1974   BOWEL SURGERY FOR OBSTRUCTION  1992   CLOSED MANIPULATION RIGHT KNEE REPLACEMENT  09-10-2010   DIAGNOSTIC LAPAROSCOPY  1989   x2-3 with ovarian cystectomy   FINGER ARTHROPLASTY  07/29/2012   Procedure: FINGER ARTHROPLASTY;  Surgeon: Drucilla Schmidt, MD;  Location: [facility];  Service: Orthopedics;  Laterality: Left;  Left Thumb, CMC Interpositional Arthroplasty Left Thumb,with Palmaris Longis Tendon Graft, IPSI Lateral      HEMORRHOID SURGERY  1988   KNEE ARTHROSCOPY  10-26-2008   AND REMOVAL 2 SCREWS PROXIMAL TIBIA  OF RIGHT KNEE   KNEE ARTHROTOMY  LAST ONE 2000   right x 3 prior to replacement   LEFT SHOULDER ARTHROSCOPY  03-02-2012   THYROID LOBECTOMY  2009   PARTIAL LEFT LOWER LOBECTOMY   TONSILLECTOMY  1959   TOTAL KNEE ARTHROPLASTY  07-31-2010   OA  RIGHT KNEE   TOTAL KNEE ARTHROPLASTY Left 10/03/2020   Procedure: LEFT TOTAL KNEE ARTHROPLASTY;  Surgeon: Kathryne Hitch, MD;  Location: [facility];  Service: Orthopedics;  Laterality: Left;   Social History  Socioeconomic History   Marital status: Married    Spouse name: Not on file   Number of children: Not on file   Years of education: Not on file   Highest education level: Not on file  Occupational History   Not on file  Tobacco Use   Smoking status: Every Day    Packs/day: 1.00    Years: 43.00    Additional pack years: 0.00    Total pack years: 43.00    Types: Cigarettes   Smokeless tobacco: Never  Vaping Use   Vaping Use: Never used  Substance and Sexual Activity   Alcohol use: No   Drug use: No   Sexual activity: Not on file  Other Topics Concern   Not on file  Social History Narrative   Not on file   Social Determinants of Health   Financial Resource Strain: Not on file  Food Insecurity: Not on file  Transportation Needs: Not on file  Physical Activity: Not on file  Stress: Not on file  Social Connections: Not on file   No family history on  file. Allergies  Allergen Reactions   Codeine Nausea And Vomiting   Doxycycline Nausea And Vomiting   Penicillins Other (See Comments)    Family cannot take / update 03/02/12 - pt. does not remember the reaction.   Sulfa Antibiotics Nausea And Vomiting   Tylenol [Acetaminophen] Other (See Comments)    Cannot take with Neurontin / update 03/02/12 - causes diarrhea   Vistaril [Hydroxyzine Hcl] Other (See Comments)     Hallucinations   Current Outpatient Medications  Medication Sig Dispense Refill   albuterol (PROVENTIL HFA;VENTOLIN HFA) 108 (90 BASE) MCG/ACT inhaler Inhale 2 puffs into the lungs every 6 (six) hours as needed for wheezing or shortness of breath.      Ascorbic Acid (VITAMIN C) 1000 MG tablet Take 1,000 mg by mouth 2 (two) times daily.     aspirin 325 MG EC tablet TAKE 1 TABLET BY MOUTH TWICE A DAY AFTER MEAL 30 tablet 0   atorvastatin (LIPITOR) 20 MG tablet Take 10 mg by mouth daily before breakfast.      B Complex-C (B-COMPLEX WITH VITAMIN C) tablet Take 1 tablet by mouth daily.     buPROPion (WELLBUTRIN SR) 150 MG 12 hr tablet Take 450 mg by mouth daily before breakfast.      butalbital-acetaminophen-caffeine (FIORICET, ESGIC) 50-325-40 MG per tablet Take 1 tablet by mouth 2 (two) times daily as needed for headache or migraine.      Calcium Carbonate-Vitamin D (CALCIUM + D PO) Take 1 tablet by mouth 2 (two) times daily.     clonazePAM (KLONOPIN) 0.5 MG tablet Take 0.25-0.5 mg by mouth 3 (three) times daily as needed for anxiety.      DULoxetine (CYMBALTA) 30 MG capsule Take 30 mg by mouth 2 (two) times daily.     estradiol (ESTRACE) 1 MG tablet Take 1 mg by mouth daily.      Famotidine (PEPCID PO) Take by mouth.     famotidine (PEPCID) 40 MG tablet Take by mouth.     fentaNYL (DURAGESIC) 50 MCG/HR Place 1 patch onto the skin every other day.      fexofenadine (ALLEGRA) 180 MG tablet Take 180 mg by mouth daily.     fluticasone (FLONASE) 50 MCG/ACT nasal spray Place 1 spray  into both nostrils daily as needed for allergies.      Fluticasone-Umeclidin-Vilant 100-62.5-25 MCG/INH AEPB Inhale 1 puff into the lungs  daily.      gabapentin (NEURONTIN) 300 MG capsule Take 300 mg by mouth 2 (two) times daily.     glycopyrrolate (ROBINUL) 1 MG tablet Take 1 mg by mouth at bedtime.     HYDROmorphone (DILAUDID) 4 MG tablet Take 1 tablet (4 mg total) by mouth every 4 (four) hours as needed for severe pain (if oxycodone not effective). (Patient taking differently: Take 2 mg by mouth every 4 (four) hours as needed for severe pain (if oxycodone not effective).) 30 tablet 0   levothyroxine (SYNTHROID, LEVOTHROID) 25 MCG tablet Take 25 mcg by mouth daily before breakfast.     naratriptan (AMERGE) 2.5 MG tablet Take 2.5 mg by mouth as needed for migraine.      NARCAN 4 MG/0.1ML LIQD nasal spray kit Place 0.4 mg into the nose once.      pilocarpine (SALAGEN) 5 MG tablet Take 5 mg by mouth 2 (two) times daily.      rOPINIRole (REQUIP) 2 MG tablet Take 4 mg by mouth at bedtime.     traZODone (DESYREL) 50 MG tablet Take 50-100 mg by mouth at bedtime.     valACYclovir (VALTREX) 1000 MG tablet Take 1,000 mg by mouth 2 (two) times daily as needed (outbreak).      verapamil (CALAN) 40 MG tablet Take 40 mg by mouth 2 (two) times daily.     No current facility-administered medications for this visit.   No results found.  Review of Systems:   A ROS was performed including pertinent positives and negatives as documented in the HPI.  Physical Exam :   Constitutional: NAD and appears stated age Neurological: Alert and oriented Psych: Appropriate affect and cooperative There were no vitals taken for this visit.   Comprehensive Musculoskeletal Exam:    Musculoskeletal Exam    Inspection Right Left  Skin No atrophy or winging No atrophy or winging  Palpation    Tenderness Glenohumeral Glenohumeral  Range of Motion    Flexion (passive) 160 160  Flexion (active) 150 150  Abduction 140  140  ER at the side 50 50  Can reach behind back to L1 L1  Strength     4 out of 5 with pain and forward elevation Full  Special Tests    Pseudoparalytic No No  Neurologic    Fires PIN, radial, median, ulnar, musculocutaneous, axillary, suprascapular, long thoracic, and spinal accessory innervated muscles. No abnormal sensibility  Vascular/Lymphatic    Radial Pulse 2+ 2+  Cervical Exam    Patient has symmetric cervical range of motion with negative Spurling's test.  Special Test:positive Neer impingement     Imaging:   Xray (right shoulder 3 views): Acromiohumeral interval narrowing with significant glenohumeral degenerative findings  MRI (right shoulder): Severe glenohumeral osteoarthritis with superior migration of the humeral head in the setting of a thin rotator cuff with cystic changes involving the insertion  I personally reviewed and interpreted the radiographs.   Assessment:   72 y.o. female right-hand-dominant female with right shoulder pain in the setting of severe glenohumeral osteoarthritis as well as superior humeral migration consistent with early rotator cuff arthropathy.  At today's visit she has had multiple injections which are no longer effective for her.  Given this we did discuss additional treatment options.  I discussed that I am somewhat hesitant the results that would be associated with physical therapy as I do believe that she is developing rotator cuff arthropathy that I do believe her pain would be quite  significant as she works for range of motion and strengthening.  Given this we did discuss possible reverse shoulder arthroplasty.  I did discuss that she would need to be on a slightly increased post operative narcotic regimen although she would undergo a nerve block to help with this.  After long discussion she has elected for right shoulder reverse shoulder arthroplasty  Plan :    -Plan for right shoulder reverse shoulder   After a lengthy discussion  of treatment options, including risks, benefits, alternatives, complications of surgical and nonsurgical conservative options, the patient elected surgical repair.   The patient  is aware of the material risks  and complications including, but not limited to injury to adjacent structures, neurovascular injury, infection, numbness, bleeding, implant failure, thermal burns, stiffness, persistent pain, failure to heal, disease transmission from allograft, need for further surgery, dislocation, anesthetic risks, blood clots, risks of death,and others. The probabilities of surgical success and failure discussed with patient given their particular co-morbidities.The time and nature of expected rehabilitation and recovery was discussed.The patient's questions were all answered preoperatively.  No barriers to understanding were noted. I explained the natural history of the disease process and Rx rationale.  I explained to the patient what I considered to be reasonable expectations given their personal situation.  The final treatment plan was arrived at through a shared patient decision making process model.      I personally saw and evaluated the patient, and participated in the management and treatment plan.  Huel Cote, MD Attending Physician, Orthopedic Surgery  This document was dictated using Dragon voice recognition software. A reasonable attempt at proof reading has been made to minimize errors.

## 2023-05-20 ENCOUNTER — Encounter (HOSPITAL_BASED_OUTPATIENT_CLINIC_OR_DEPARTMENT_OTHER): Payer: Self-pay | Admitting: Orthopaedic Surgery

## 2023-05-24 DIAGNOSIS — Z8616 Personal history of COVID-19: Secondary | ICD-10-CM

## 2023-05-24 HISTORY — DX: Personal history of COVID-19: Z86.16

## 2023-06-04 ENCOUNTER — Ambulatory Visit (HOSPITAL_COMMUNITY): Payer: Medicare Other

## 2023-06-09 ENCOUNTER — Ambulatory Visit: Payer: Medicare Other | Admitting: Orthopaedic Surgery

## 2023-06-09 ENCOUNTER — Other Ambulatory Visit (HOSPITAL_BASED_OUTPATIENT_CLINIC_OR_DEPARTMENT_OTHER): Payer: Self-pay | Admitting: Orthopaedic Surgery

## 2023-06-09 DIAGNOSIS — M12811 Other specific arthropathies, not elsewhere classified, right shoulder: Secondary | ICD-10-CM

## 2023-06-11 ENCOUNTER — Ambulatory Visit (HOSPITAL_COMMUNITY)
Admission: RE | Admit: 2023-06-11 | Discharge: 2023-06-11 | Disposition: A | Discharge status: Home / Self Care | Payer: Medicare Other | Source: Ambulatory Visit | Attending: Orthopaedic Surgery | Admitting: Orthopaedic Surgery

## 2023-06-11 DIAGNOSIS — M12811 Other specific arthropathies, not elsewhere classified, right shoulder: Secondary | ICD-10-CM

## 2023-06-17 ENCOUNTER — Ambulatory Visit (HOSPITAL_BASED_OUTPATIENT_CLINIC_OR_DEPARTMENT_OTHER): Payer: Medicare Other | Admitting: Physical Therapy

## 2023-06-18 ENCOUNTER — Ambulatory Visit (HOSPITAL_BASED_OUTPATIENT_CLINIC_OR_DEPARTMENT_OTHER): Payer: Medicare Other | Admitting: Orthopaedic Surgery

## 2023-06-22 ENCOUNTER — Encounter (HOSPITAL_BASED_OUTPATIENT_CLINIC_OR_DEPARTMENT_OTHER): Payer: Self-pay | Admitting: Orthopaedic Surgery

## 2023-06-23 ENCOUNTER — Other Ambulatory Visit (HOSPITAL_BASED_OUTPATIENT_CLINIC_OR_DEPARTMENT_OTHER): Payer: Self-pay | Admitting: Orthopaedic Surgery

## 2023-06-23 DIAGNOSIS — M12811 Other specific arthropathies, not elsewhere classified, right shoulder: Secondary | ICD-10-CM

## 2023-06-24 ENCOUNTER — Inpatient Hospital Stay (HOSPITAL_COMMUNITY): Admission: RE | Admit: 2023-06-24 | Payer: Medicare Other | Source: Ambulatory Visit

## 2023-06-25 NOTE — Progress Notes (Signed)
Surgical Instructions    Your procedure is scheduled on Tuesday July 06, 2023.  Report to [facility] Main Entrance "A" at 1:00 P.M., then check in with the Admitting office.  Call this number if you have problems the morning of surgery:  [phone]   If you have any questions prior to your surgery date call [phone]: Open Monday-Friday 8am-4pm If you experience any cold or flu symptoms such as cough, fever, chills, shortness of breath, etc. between now and your scheduled surgery, please notify us at the above number     Remember:  Do not eat after midnight the night before your surgery.  You may drink clear liquids until 12:00 the morning of your surgery.   Clear liquids allowed are: Water, Non-Citrus Juices (without pulp), Carbonated Beverages, Clear Tea, Black Coffee ONLY (NO MILK, CREAM OR POWDERED CREAMER of any kind), and Gatorade.    Take these medicines the morning of surgery with A SIP OF WATER:  atorvastatin (LIPITOR)  buPROPion (WELLBUTRIN XL)  escitalopram (LEXAPRO)  estradiol (ESTRACE)  famotidine (PEPCID)  fexofenadine (ALLEGRA)  Fluticasone-Umeclidin-Vilant Inhaler gabapentin (NEURONTIN)  levothyroxine (SYNTHROID    verapamil  If Needed:  albuterol Inhaler: Please bring with you the day of surgery.  albuterol nebulizer solution  clonazePAM (KLONOPIN)  fluticasone (FLONASE) Nasal spray HYDROmorphone (DILAUDID)  Eye drops  naratriptan (AMERGE)  valACYclovir (VALTREX)   Follow your surgeon's instructions on when to stop Aspirin.  If no instructions were given by your surgeon then you will need to call the office to get those instructions.    As of today, STOP taking any Aspirin (unless otherwise instructed by your surgeon) Aleve, Naproxen, Ibuprofen, Motrin, Advil, Goody's, BC's, all herbal medications, fish oil, and all vitamins.  Special instructions:    Oral Hygiene is also important to reduce your risk of infection.  Remember - BRUSH YOUR TEETH THE  MORNING OF SURGERY WITH YOUR REGULAR TOOTHPASTE   Pre-operative 5 CHG Bath Instructions   You can play a key role in reducing the risk of infection after surgery. Your skin needs to be as free of germs as possible. You can reduce the number of germs on your skin by washing with CHG (chlorhexidine gluconate) soap before surgery. CHG is an antiseptic soap that kills germs and continues to kill germs even after washing.   DO NOT use if you have an allergy to chlorhexidine/CHG or antibacterial soaps. If your skin becomes reddened or irritated, stop using the CHG and notify one of our RNs at [phone].   Please shower with the CHG soap starting 4 days before surgery using the following schedule:     Please keep in mind the following:  DO NOT shave, including legs and underarms, starting the day of your first shower.   You may shave your face at any point before/day of surgery.  Place clean sheets on your bed the day you start using CHG soap. Use a clean washcloth (not used since being washed) for each shower. DO NOT sleep with pets once you start using the CHG.   CHG Shower Instructions:  If you choose to wash your hair and private area, wash first with your normal shampoo/soap.  After you use shampoo/soap, rinse your hair and body thoroughly to remove shampoo/soap residue.  Turn the water OFF and apply about 3 tablespoons (45 ml) of CHG soap to a CLEAN washcloth.  Apply CHG soap ONLY FROM YOUR NECK DOWN TO YOUR TOES (washing for 3-5 minutes)  DO NOT use  CHG soap on face, private areas, open wounds, or sores.  Pay special attention to the area where your surgery is being performed.  If you are having back surgery, having someone wash your back for you may be helpful. Wait 2 minutes after CHG soap is applied, then you may rinse off the CHG soap.  Pat dry with a clean towel  Put on clean clothes/pajamas   If you choose to wear lotion, please use ONLY the CHG-compatible lotions on the back  of this paper.     Additional instructions for the day of surgery: DO NOT APPLY any lotions, deodorants, cologne, or perfumes.   Put on clean/comfortable clothes.  Brush your teeth.  Ask your nurse before applying any prescription medications to the skin.      CHG Compatible Lotions   Aveeno Moisturizing lotion  Cetaphil Moisturizing Cream  Cetaphil Moisturizing Lotion  Clairol Herbal Essence Moisturizing Lotion, Dry Skin  Clairol Herbal Essence Moisturizing Lotion, Extra Dry Skin  Clairol Herbal Essence Moisturizing Lotion, Normal Skin  Curel Age Defying Therapeutic Moisturizing Lotion with Alpha Hydroxy  Curel Extreme Care Body Lotion  Curel Soothing Hands Moisturizing Hand Lotion  Curel Therapeutic Moisturizing Cream, Fragrance-Free  Curel Therapeutic Moisturizing Lotion, Fragrance-Free  Curel Therapeutic Moisturizing Lotion, Original Formula  Eucerin Daily Replenishing Lotion  Eucerin Dry Skin Therapy Plus Alpha Hydroxy Crme  Eucerin Dry Skin Therapy Plus Alpha Hydroxy Lotion  Eucerin Original Crme  Eucerin Original Lotion  Eucerin Plus Crme Eucerin Plus Lotion  Eucerin TriLipid Replenishing Lotion  Keri Anti-Bacterial Hand Lotion  Keri Deep Conditioning Original Lotion Dry Skin Formula Softly Scented  Keri Deep Conditioning Original Lotion, Fragrance Free Sensitive Skin Formula  Keri Lotion Fast Absorbing Fragrance Free Sensitive Skin Formula  Keri Lotion Fast Absorbing Softly Scented Dry Skin Formula  Keri Original Lotion  Keri Skin Renewal Lotion Keri Silky Smooth Lotion  Keri Silky Smooth Sensitive Skin Lotion  Nivea Body Creamy Conditioning Oil  Nivea Body Extra Enriched Lotion  Nivea Body Original Lotion  Nivea Body Sheer Moisturizing Lotion Nivea Crme  Nivea Skin Firming Lotion  NutraDerm 30 Skin Lotion  NutraDerm Skin Lotion  NutraDerm Therapeutic Skin Cream  NutraDerm Therapeutic Skin Lotion  ProShield Protective Hand Cream  Provon moisturizing  lotion  [place]- Preparing for Total Shoulder Arthroplasty  Before surgery, you can play an important role. Because skin is not sterile, your skin needs to be as free of germs as possible. You can reduce the number of germs on your skin by using the following products.   Benzoyl Peroxide Gel  o Reduces the number of germs present on the skin  o Applied twice a day to shoulder area starting two days before surgery   Chlorhexidine Gluconate (CHG) Soap (instructions listed above on how to wash with CHG Soap)  o An antiseptic cleaner that kills germs and bonds with the skin to continue killing germs even after washing  o Used for showering the night before surgery and morning of surgery   ==================================================================  Please follow these instructions carefully:  BENZOYL PEROXIDE 5% GEL  Please do not use if you have an allergy to benzoyl peroxide. If your skin becomes reddened/irritated stop using the benzoyl peroxide.  Starting two days before surgery, apply as follows:  1. Apply benzoyl peroxide in the morning and at night. Apply after taking a shower. If you are not taking a shower clean entire shoulder front, back, and side along with the armpit with a clean wet washcloth.  2. Place a quarter-sized dollop on your SHOULDER and rub in thoroughly, making sure to cover the front, back, and side of your shoulder, along with the armpit.   2 Days prior to Surgery First Dose on _____________ Morning Second Dose on ______________ Night  Day Before Surgery First Dose on ______________ Morning Night before surgery wash (entire body except face and private areas) with CHG Soap THEN Second Dose on ____________ Night   Morning of Surgery  wash BODY AGAIN with CHG Soap   4. Do NOT apply benzoyl peroxide gel on the day of surgery   Day of Surgery: Take a shower with CHG soap. Wear Clean/Comfortable clothing the morning of surgery Do not  apply any deodorants/lotions.   Remember to brush your teeth WITH YOUR REGULAR TOOTHPASTE.   Please read over the following fact sheets that you were given.        Day of Surgery:  Take a shower with CHG soap. Wear Clean/Comfortable clothing the morning of surgery Do not apply any deodorants/lotions.   Remember to brush your teeth WITH YOUR REGULAR TOOTHPASTE.   [facility] is not responsible for any belongings or valuables.    Do NOT Smoke (Tobacco/Vaping)  24 hours prior to your procedure  If you use a CPAP at night, you may bring your mask for your overnight stay.   Contacts, glasses, hearing aids, dentures or partials may not be worn into surgery, please bring cases for these belongings   For patients admitted to the hospital, discharge time will be determined by your treatment team.   Patients discharged the day of surgery will not be allowed to drive home, and someone needs to stay with them for 24 hours.   SURGICAL WAITING ROOM VISITATION Patients having surgery or a procedure may have no more than 2 support people in the waiting area - these visitors may rotate.   Children under the age of 82 must have an adult with them who is not the patient. If the patient needs to stay at the hospital during part of their recovery, the visitor guidelines for inpatient rooms apply. Pre-op nurse will coordinate an appropriate time for 1 support person to accompany patient in pre-op.  This support person may not rotate.   Please refer to [email or web address] for the visitor guidelines for Inpatients (after your surgery is over and you are in a regular room).   If you received a COVID test during your pre-op visit, it is requested that you wear a mask when out in public, stay away from anyone that may not be feeling well, and notify your surgeon if you develop symptoms. If you have been in contact with anyone that has tested positive in the  last 10 days, please notify your surgeon.    Please read over the following fact sheets that you were given.

## 2023-06-28 ENCOUNTER — Other Ambulatory Visit: Payer: Self-pay

## 2023-06-28 ENCOUNTER — Inpatient Hospital Stay (HOSPITAL_COMMUNITY): Admission: RE | Admit: 2023-06-28 | Payer: Medicare Other | Source: Ambulatory Visit

## 2023-06-28 ENCOUNTER — Encounter (HOSPITAL_COMMUNITY): Payer: Self-pay

## 2023-06-28 ENCOUNTER — Encounter (HOSPITAL_COMMUNITY): Payer: Self-pay | Admitting: Physician Assistant

## 2023-06-28 VITALS — BP 100/70 | HR 90 | Temp 98.3°F | Resp 18 | Ht 63.0 in | Wt 104.2 lb

## 2023-06-28 DIAGNOSIS — K219 Gastro-esophageal reflux disease without esophagitis: Secondary | ICD-10-CM | POA: Insufficient documentation

## 2023-06-28 DIAGNOSIS — Z01818 Encounter for other preprocedural examination: Secondary | ICD-10-CM | POA: Diagnosis not present

## 2023-06-28 DIAGNOSIS — Z9981 Dependence on supplemental oxygen: Secondary | ICD-10-CM | POA: Insufficient documentation

## 2023-06-28 DIAGNOSIS — E039 Hypothyroidism, unspecified: Secondary | ICD-10-CM | POA: Insufficient documentation

## 2023-06-28 DIAGNOSIS — J449 Chronic obstructive pulmonary disease, unspecified: Secondary | ICD-10-CM | POA: Insufficient documentation

## 2023-06-28 DIAGNOSIS — Z87891 Personal history of nicotine dependence: Secondary | ICD-10-CM | POA: Insufficient documentation

## 2023-06-28 DIAGNOSIS — F419 Anxiety disorder, unspecified: Secondary | ICD-10-CM | POA: Diagnosis not present

## 2023-06-28 DIAGNOSIS — I2510 Atherosclerotic heart disease of native coronary artery without angina pectoris: Secondary | ICD-10-CM | POA: Diagnosis not present

## 2023-06-28 LAB — BASIC METABOLIC PANEL
Anion gap: 11 (ref 5–15)
BUN: 12 mg/dL (ref 8–23)
CO2: 27 mmol/L (ref 22–32)
Calcium: 8.5 mg/dL — ABNORMAL LOW (ref 8.9–10.3)
Chloride: 100 mmol/L (ref 98–111)
Creatinine, Ser: 0.91 mg/dL (ref 0.44–1.00)
GFR, Estimated: >60 mL/min (ref >60)
Glucose, Bld: 85 mg/dL (ref 70–99)
Potassium: 3.5 mmol/L (ref 3.5–5.1)
Sodium: 138 mmol/L (ref 135–145)

## 2023-06-28 LAB — CBC
HCT: 40.5 % (ref 36.0–46.0)
Hemoglobin: 12.3 g/dL (ref 12.0–15.0)
MCH: 27.5 pg (ref 26.0–34.0)
MCHC: 30.4 g/dL (ref 30.0–36.0)
MCV: 90.4 fL (ref 80.0–100.0)
Platelets: 204 10*3/uL (ref 150–400)
RBC: 4.48 MIL/uL (ref 3.87–5.11)
RDW: 15.2 % (ref 11.5–15.5)
WBC: 11.4 10*3/uL — ABNORMAL HIGH (ref 4.0–10.5)
nRBC: 0 % (ref 0.0–0.2)

## 2023-06-28 LAB — SURGICAL PCR SCREEN
MRSA, PCR: NEGATIVE
Staphylococcus aureus: NEGATIVE

## 2023-06-28 NOTE — Progress Notes (Signed)
PCP - Dr Ardean Larsen- last office note requested Cardiologist - Denies [facility]mon[facility] last office/clearance note requested  PPM/ICD - denies   Chest x-ray - N/A EKG - 06/28/2023  Stress Test - denies ECHO - denies Cardiac Cath - denies  Sleep Study - denies  Fasting Blood Sugar - N/A  Blood Thinner Instructions: Aspirin Instructions: Follow your surgeon's instructions on when to stop Aspirin.  If no instructions were given by your surgeon then you will need to call the office to get those instructions.  Pt states she takes ASA PRN and Fiorinal and will stop both a week prior to surgery.    ERAS Protcol - ERAS per protocol- no orders in epic   COVID TEST- N/A  Pt reports some blurriness to left eye, d/t a scratch. She reports she has seen her eye doctor about this already.   Anesthesia review: yes- requested records. Pt wears Oxygen PRN at home and at night when sleeping. Pt states she feels that her breathing is at its baseline. Pt reports that when smoking marijuana she will use her inhaler.   Patient denies shortness of breath, fever, cough and chest pain at PAT appointment   All instructions explained to the patient, with a verbal understanding of the material. Patient agrees to go over the instructions while at home for a better understanding. The opportunity to ask questions was provided.

## 2023-06-29 ENCOUNTER — Ambulatory Visit (HOSPITAL_BASED_OUTPATIENT_CLINIC_OR_DEPARTMENT_OTHER): Payer: Self-pay | Admitting: Orthopaedic Surgery

## 2023-06-29 DIAGNOSIS — M12811 Other specific arthropathies, not elsewhere classified, right shoulder: Secondary | ICD-10-CM

## 2023-06-29 NOTE — Progress Notes (Signed)
Anesthesia Chart Review:  72 year old female with pertinent to including former smoker (greater than 100-pack-year history, quit 2022) with associated oxygen dependent COPD, asthma, GERD, hypothyroid, anxiety.  History of oxygen dependent COPD, uses 3 L supplemental O2 continuous.  She is followed by pulmonologist Dr. Damien Fusi at [facility] in [place].  She was seen 05/20/2023 for preop evaluation.  Per note, "ARISCAT Score: 43 points, intermediate risk 13.3% for post operative pulmonary complications Chales Abrahams Post operative Respiratory Failure Score: 1% risk for postoperative mechanical ventilation > 48 hours post operatively Surgical Hx: - L knee arthroplasty 2020 - had GA and did fine; she was on oxygen at the time Medications: - Aspirin 325 mg daily Oxygen: 3 L oxygen 24/7 Exacerbation/Pneumonia: 3 times this year."  Preop labs reviewed, unremarkable.  EKG 06/28/2023: NSR.  Rate 71.  Coronary calcium scan 01/08/2022: FINDINGS: Non-cardiac: See separate report from [facility].   Ascending Aorta: Normal caliber.   Aortic atherosclerosis.   Pericardium: Normal.   Coronary arteries: Normal origins.   Coronary Calcium Score:   Left main: 0   Left anterior descending artery: 0   Left circumflex artery: 0   Right coronary artery: 0   Total: 0   Percentile: 1st for age, sex, and race matched control.   IMPRESSION: 1. Coronary calcium score of 0. This was 1st percentile for age, gender, and race matched controls.   Zannie Cove [facility] Short Stay Center/Anesthesiology Phone [phone] 06/29/2023 12:44 PM

## 2023-06-29 NOTE — Anesthesia Preprocedure Evaluation (Deleted)
Anesthesia Evaluation    Airway        Dental   Pulmonary former smoker          Cardiovascular      Neuro/Psych    GI/Hepatic   Endo/Other    Renal/GU      Musculoskeletal   Abdominal   Peds  Hematology   Anesthesia Other Findings   Reproductive/Obstetrics                              Anesthesia Physical Anesthesia Plan  ASA:   Anesthesia Plan:    Post-op Pain Management:    Induction:   PONV Risk Score and Plan:   Airway Management Planned:   Additional Equipment:   Intra-op Plan:   Post-operative Plan:   Informed Consent:   Plan Discussed with:   Anesthesia Plan Comments: (PAT note by Antionette Poles, PA-C:  72 year old female with pertinent to including former smoker (greater than 100-pack-year history, quit 2022) with associated oxygen dependent COPD, asthma, GERD, hypothyroid, anxiety.  History of oxygen dependent COPD, uses 3 L supplemental O2 continuous.  She is followed by pulmonologist Dr. Damien Fusi at [facility] in [place].  She was seen 05/20/2023 for preop evaluation.  Per note, "ARISCAT Score: 43 points, intermediate risk 13.3% for post operative pulmonary complications Chales Abrahams Post operative Respiratory Failure Score: 1% risk for postoperative mechanical ventilation > 48 hours post operatively Surgical Hx: - L knee arthroplasty 2020 - had GA and did fine; she was on oxygen at the time Medications: - Aspirin 325 mg daily Oxygen: 3 L oxygen 24/7 Exacerbation/Pneumonia: 3 times this year."  Preop labs reviewed, unremarkable.  EKG 06/28/2023: NSR.  Rate 71.  Coronary calcium scan 01/08/2022: FINDINGS: Non-cardiac: See separate report from [facility].   Ascending Aorta: Normal caliber.   Aortic atherosclerosis.   Pericardium: Normal.   Coronary arteries: Normal origins.   Coronary Calcium Score:   Left main: 0   Left anterior descending artery: 0    Left circumflex artery: 0   Right coronary artery: 0   Total: 0   Percentile: 1st for age, sex, and race matched control.   IMPRESSION: 1. Coronary calcium score of 0. This was 1st percentile for age, gender, and race matched controls.   )         Anesthesia Quick Evaluation

## 2023-07-05 NOTE — Progress Notes (Signed)
Pt called reporting that she and her husband are both sick with a sore throat, cough and and not feeling well. Pt has not been tested for FLU/COVID. Pt encouraged to reach out to her PCP about this. Pt states she will not be coming to surgery tomorrow, and will give Dr. Serena Croissant office a call as well. Dr. Serena Croissant scheduler April notified- she will reach out to patient.

## 2023-07-06 ENCOUNTER — Ambulatory Visit (HOSPITAL_COMMUNITY): Admission: RE | Admit: 2023-07-06 | Payer: Medicare Other | Source: Home / Self Care | Admitting: Orthopaedic Surgery

## 2023-07-06 ENCOUNTER — Encounter (HOSPITAL_COMMUNITY): Admission: RE | Payer: Self-pay | Source: Home / Self Care

## 2023-07-06 SURGERY — ARTHROPLASTY, SHOULDER, TOTAL, REVERSE
Anesthesia: Regional | Site: Shoulder | Laterality: Right

## 2023-07-09 ENCOUNTER — Ambulatory Visit (HOSPITAL_BASED_OUTPATIENT_CLINIC_OR_DEPARTMENT_OTHER): Payer: Medicare Other | Admitting: Physical Therapy

## 2023-07-16 ENCOUNTER — Other Ambulatory Visit: Payer: Self-pay | Admitting: Medical Genetics

## 2023-07-16 ENCOUNTER — Encounter (HOSPITAL_BASED_OUTPATIENT_CLINIC_OR_DEPARTMENT_OTHER): Payer: Medicare Other | Admitting: Physical Therapy

## 2023-07-16 DIAGNOSIS — Z006 Encounter for examination for normal comparison and control in clinical research program: Secondary | ICD-10-CM

## 2023-07-19 ENCOUNTER — Encounter (HOSPITAL_BASED_OUTPATIENT_CLINIC_OR_DEPARTMENT_OTHER): Payer: Medicare Other | Admitting: Orthopaedic Surgery

## 2023-07-20 ENCOUNTER — Telehealth: Payer: Self-pay | Admitting: Orthopaedic Surgery

## 2023-07-20 NOTE — Telephone Encounter (Signed)
Pt would like to know if she is okay to get the bursa injection and consult about shoulder surgery with [place] she does not want to do it with Magnus Ivan

## 2023-07-20 NOTE — Telephone Encounter (Signed)
I called and advised Dr. Steward Drone can do that type of inj. She wants to discuss this and shoulder sx with him. She already has an appt.

## 2023-07-21 ENCOUNTER — Ambulatory Visit: Payer: Medicare Other | Admitting: Orthopaedic Surgery

## 2023-07-23 ENCOUNTER — Encounter (HOSPITAL_BASED_OUTPATIENT_CLINIC_OR_DEPARTMENT_OTHER): Payer: Medicare Other

## 2023-07-30 ENCOUNTER — Encounter (HOSPITAL_BASED_OUTPATIENT_CLINIC_OR_DEPARTMENT_OTHER): Payer: Medicare Other

## 2023-08-03 ENCOUNTER — Encounter (HOSPITAL_BASED_OUTPATIENT_CLINIC_OR_DEPARTMENT_OTHER): Payer: Medicare Other | Admitting: Physical Therapy

## 2023-08-06 ENCOUNTER — Encounter (HOSPITAL_BASED_OUTPATIENT_CLINIC_OR_DEPARTMENT_OTHER): Payer: Medicare Other | Admitting: Physical Therapy

## 2023-08-06 ENCOUNTER — Ambulatory Visit (HOSPITAL_BASED_OUTPATIENT_CLINIC_OR_DEPARTMENT_OTHER): Payer: Medicare Other | Admitting: Orthopaedic Surgery

## 2023-08-06 DIAGNOSIS — M25511 Pain in right shoulder: Secondary | ICD-10-CM

## 2023-08-06 DIAGNOSIS — G8929 Other chronic pain: Secondary | ICD-10-CM

## 2023-08-06 DIAGNOSIS — M25512 Pain in left shoulder: Secondary | ICD-10-CM | POA: Diagnosis not present

## 2023-08-06 MED ORDER — TRIAMCINOLONE ACETONIDE 40 MG/ML IJ SUSP
80.0000 mg | INTRAMUSCULAR | Status: AC | PRN
Start: 2023-08-06 — End: 2023-08-06
  Administered 2023-08-06: 80 mg via INTRA_ARTICULAR

## 2023-08-06 MED ORDER — LIDOCAINE HCL 1 % IJ SOLN
4.0000 mL | INTRAMUSCULAR | Status: AC | PRN
Start: 2023-08-06 — End: 2023-08-06
  Administered 2023-08-06: 4 mL

## 2023-08-06 NOTE — Progress Notes (Signed)
Chief Complaint: Right shoulder pain     History of Present Illness:   08/06/2023: Presents today with left worse than right shoulder pain.  She recently did have to delay her shoulder arthroplasty as result of COVID.  [name] is a 72 y.o. female right-hand-dominant female presents with ongoing right shoulder pain that has been painful for the last year and worse over the last 3 months.  She is having pain with any type of overhead activity or range of motion.  She has been having multiple injections with Dr. Magnus Ivan which has given her relief although these are no longer giving her any type of significant relief.  She is on long-term narcotics from issues with her back.  She is scheduled to undergo radiofrequency ablation in July of this year.  She is having a difficult time reaching overhead or doing her hair.  She states that this has been really bothersome lately and she is no longer able to even lay on the side.    Surgical History:   none  PMH/PSH/Family History/Social History/Meds/Allergies:    Past Medical History:  Diagnosis Date  . Anxiety    states frequent  anxiety attacks  . Asthma   . Cancer (HCC)    HX OF THYROID CANCER   . Cold no drainage   since thursday 07-21-12, chest xray done 07-24-2012   . COPD (chronic obstructive pulmonary disease) (HCC)    LOV with PFT 1/13  Dr Cherie Ouch on chart-   pt states no changes since last saw pulmonary  . Depression   . Fibromyalgia    states has had trigger point injections shoulders-   . GERD (gastroesophageal reflux disease)   . Headache(784.0)    migraines  . History of DVT of lower extremity 1972   right leg  . Hyperlipidemia   . Hypothyroidism   . OA (osteoarthritis)   . On home oxygen therapy    2 liters per minute per Ramona as needed  . Pneumonia    2008  . Seasonal allergies    sgtates clear nasal drainage x 3- days- states no fever  . Shortness of breath    oxygen  at 2 liters at night, WITH EXERTION    Past Surgical History:  Procedure Laterality Date  . ABDOMINAL HYSTERECTOMY  1990   bilateral salpingo-oophorectomy  . BARTHOLIN GLAND CYST EXCISION  1977   PRIOR MARSIPULIZATION IN 1974  . BOWEL SURGERY FOR OBSTRUCTION  1992  . CLOSED MANIPULATION RIGHT KNEE REPLACEMENT  09-10-2010  . DIAGNOSTIC LAPAROSCOPY  1989   x2-3 with ovarian cystectomy  . FINGER ARTHROPLASTY  07/29/2012   Procedure: FINGER ARTHROPLASTY;  Surgeon: Drucilla Schmidt, MD;  Location: [facility];  Service: Orthopedics;  Laterality: Left;  Left Thumb, CMC Interpositional Arthroplasty Left Thumb,with Palmaris Longis Tendon Graft, IPSI Lateral     . HEMORRHOID SURGERY  1988  . KNEE ARTHROSCOPY  10-26-2008   AND REMOVAL 2 SCREWS PROXIMAL TIBIA  OF RIGHT KNEE  . KNEE ARTHROTOMY  LAST ONE 2000   right x 3 prior to replacement  . LEFT SHOULDER ARTHROSCOPY  03-02-2012  . THYROID LOBECTOMY  2009   PARTIAL LEFT LOWER LOBECTOMY  . TONSILLECTOMY  1959  . TOTAL KNEE ARTHROPLASTY  07-31-2010   OA  RIGHT KNEE  . TOTAL KNEE  ARTHROPLASTY Left 10/03/2020   Procedure: LEFT TOTAL KNEE ARTHROPLASTY;  Surgeon: Kathryne Hitch, MD;  Location: [facility];  Service: Orthopedics;  Laterality: Left;   Social History   Socioeconomic History  . Marital status: Married    Spouse name: Not on file  . Number of children: Not on file  . Years of education: Not on file  . Highest education level: Not on file  Occupational History  . Not on file  Tobacco Use  . Smoking status: Former    Current packs/day: 0.00    Average packs/day: 1 pack/day for 43.0 years (43.0 ttl pk-yrs)    Types: Cigarettes    Quit date: 09/23/2021    Years since quitting: 1.8  . Smokeless tobacco: Never  Vaping Use  . Vaping status: Never Used  Substance and Sexual Activity  . Alcohol use: No  . Drug use: Yes    Types: Marijuana    Comment: smokes once every 3 weeks  . Sexual activity: Not on file  Other Topics  Concern  . Not on file  Social History Narrative  . Not on file   Social Determinants of Health   Financial Resource Strain: Not on file  Food Insecurity: Not on file  Transportation Needs: Not on file  Physical Activity: Not on file  Stress: Not on file  Social Connections: Not on file   No family history on file. Allergies  Allergen Reactions  . Codeine Nausea And Vomiting  . Doxycycline Nausea And Vomiting    Pt states she can tolerate this  . Penicillins Other (See Comments)    Family cannot take / update 03/02/12 - pt. does not remember the reaction.  . Sulfa Antibiotics Nausea And Vomiting  . Tylenol [Acetaminophen] Other (See Comments)    Cannot take with Neurontin / update 03/02/12 - causes diarrhea  . Vistaril [Hydroxyzine Hcl] Other (See Comments)     Hallucinations   Current Outpatient Medications  Medication Sig Dispense Refill  . albuterol (PROVENTIL HFA;VENTOLIN HFA) 108 (90 BASE) MCG/ACT inhaler Inhale 2 puffs into the lungs every 6 (six) hours as needed for wheezing or shortness of breath.     Marland Kitchen albuterol (PROVENTIL) (2.5 MG/3ML) 0.083% nebulizer solution Take 2.5 mg by nebulization every 6 (six) hours as needed for shortness of breath or wheezing.    . Ascorbic Acid (VITAMIN C) 1000 MG tablet Take 1,000 mg by mouth 2 (two) times daily.    Marland Kitchen aspirin 325 MG EC tablet TAKE 1 TABLET BY MOUTH TWICE A DAY AFTER MEAL (Patient taking differently: 325 mg every 6 (six) hours as needed for moderate pain.) 30 tablet 0  . atorvastatin (LIPITOR) 20 MG tablet Take 10 mg by mouth daily before breakfast.     . B Complex-C (B-COMPLEX WITH VITAMIN C) tablet Take 1 tablet by mouth daily.    Marland Kitchen buPROPion (WELLBUTRIN XL) 300 MG 24 hr tablet Take 300 mg by mouth daily.    . butalbital-aspirin-caffeine (FIORINAL) 50-325-40 MG capsule Take 1 capsule by mouth 2 (two) times daily as needed for headache.    . clonazePAM (KLONOPIN) 0.5 MG tablet Take 0.25-0.5 mg by mouth 3 (three) times daily  as needed for anxiety.     Marland Kitchen escitalopram (LEXAPRO) 10 MG tablet Take 10 mg by mouth daily.    Marland Kitchen estradiol (ESTRACE) 0.5 MG tablet Take 0.5 mg by mouth daily.    . famotidine (PEPCID) 40 MG tablet Take 40 mg by mouth 2 (two) times daily.    Marland Kitchen  fentaNYL (DURAGESIC) 25 MCG/HR Place 1 patch onto the skin every other day.    . fexofenadine (ALLEGRA) 180 MG tablet Take 180 mg by mouth daily.    . fluticasone (FLONASE) 50 MCG/ACT nasal spray Place 1 spray into both nostrils daily as needed for allergies.     . Fluticasone-Umeclidin-Vilant 100-62.5-25 MCG/INH AEPB Inhale 1 puff into the lungs daily.     Marland Kitchen gabapentin (NEURONTIN) 300 MG capsule Take 300 mg by mouth 2 (two) times daily.    Marland Kitchen glycopyrrolate (ROBINUL) 1 MG tablet Take 1 mg by mouth at bedtime.    Marland Kitchen HYDROmorphone (DILAUDID) 2 MG tablet Take 2 mg by mouth 4 (four) times daily as needed for moderate pain.    Marland Kitchen HYDROmorphone (DILAUDID) 4 MG tablet Take 1 tablet (4 mg total) by mouth every 4 (four) hours as needed for severe pain (if oxycodone not effective). (Patient not taking: Reported on 06/22/2023) 30 tablet 0  . hydroxypropyl methylcellulose / hypromellose (ISOPTO TEARS / GONIOVISC) 2.5 % ophthalmic solution Place 1 drop into both eyes as needed for dry eyes.    Marland Kitchen levothyroxine (SYNTHROID, LEVOTHROID) 25 MCG tablet Take 25 mcg by mouth daily before breakfast.    . Multiple Vitamin (MULTIVITAMIN WITH MINERALS) TABS tablet Take 1 tablet by mouth daily.    . Multiple Vitamins-Minerals (PRESERVISION AREDS 2 PO) Take 1 capsule by mouth in the morning and at bedtime.    . naratriptan (AMERGE) 2.5 MG tablet Take 2.5 mg by mouth as needed for migraine.     Marland Kitchen NARCAN 4 MG/0.1ML LIQD nasal spray kit Place 0.4 mg into the nose once.     . OXYGEN Inhale 3 L into the lungs as needed (shortness of breath).    Marland Kitchen rOPINIRole (REQUIP) 2 MG tablet Take 4 mg by mouth at bedtime.    . traZODone (DESYREL) 50 MG tablet Take 50-100 mg by mouth at bedtime.    .  valACYclovir (VALTREX) 1000 MG tablet Take 1,000 mg by mouth 2 (two) times daily as needed (outbreak).     . verapamil (CALAN-SR) 120 MG CR tablet Take 120 mg by mouth daily.     No current facility-administered medications for this visit.   No results found.  Review of Systems:   A ROS was performed including pertinent positives and negatives as documented in the HPI.  Physical Exam :   Constitutional: NAD and appears stated age Neurological: Alert and oriented Psych: Appropriate affect and cooperative There were no vitals taken for this visit.   Comprehensive Musculoskeletal Exam:    Musculoskeletal Exam    Inspection Right Left  Skin No atrophy or winging No atrophy or winging  Palpation    Tenderness Glenohumeral Glenohumeral  Range of Motion    Flexion (passive) 160 160  Flexion (active) 150 150  Abduction 140 140  ER at the side 50 50  Can reach behind back to L1 L1  Strength     4 out of 5 with pain and forward elevation Full  Special Tests    Pseudoparalytic No No  Neurologic    Fires PIN, radial, median, ulnar, musculocutaneous, axillary, suprascapular, long thoracic, and spinal accessory innervated muscles. No abnormal sensibility  Vascular/Lymphatic    Radial Pulse 2+ 2+  Cervical Exam    Patient has symmetric cervical range of motion with negative Spurling's test.  Special Test:positive Neer impingement     Imaging:   Xray (right shoulder 3 views): Acromiohumeral interval narrowing with significant glenohumeral degenerative  findings  MRI (right shoulder): Severe glenohumeral osteoarthritis with superior migration of the humeral head in the setting of a thin rotator cuff with cystic changes involving the insertion  I personally reviewed and interpreted the radiographs.   Assessment:   72 y.o. female right-hand-dominant female with right shoulder pain in the setting of severe glenohumeral osteoarthritis as well as superior humeral migration consistent  with early rotator cuff arthropathy.  At today's visit she has had multiple injections which are no longer effective for her.  Given this we did discuss additional treatment options.  I discussed that I am somewhat hesitant the results that would be associated with physical therapy as I do believe that she is developing rotator cuff arthropathy that I do believe her pain would be quite significant as she works for range of motion and strengthening.  Given this we did discuss possible reverse shoulder arthroplasty.  I did discuss that she would need to be on a slightly increased post operative narcotic regimen although she would undergo a nerve block to help with this.  After long discussion she has elected for right shoulder reverse shoulder arthroplasty  At today's visit she is requesting bilateral ultrasound-guided injections which she would like to proceed with today.  We will plan for this.  I would like to do a full workup on her left shoulder during her next follow-up visit as well.  Plan :    -Bilateral sound guided injections performed referable to some retained    Procedure Note  Patient: [name]             Date of Birth: Mar 04, 1951           MRN: [id number]             Visit Date: 08/06/2023  Procedures: Visit Diagnoses: No diagnosis found.  Large Joint Inj: R glenohumeral on 08/06/2023 3:54 PM Indications: pain Details: 22 G 1.5 in needle, ultrasound-guided anterior approach  Arthrogram: No  Medications: 4 mL lidocaine 1 %; 80 mg triamcinolone acetonide 40 MG/ML Outcome: tolerated well, no immediate complications Procedure, treatment alternatives, risks and benefits explained, specific risks discussed. Consent was given by the patient. Immediately prior to procedure a time out was called to verify the correct patient, procedure, equipment, support staff and site/side marked as required. Patient was prepped and draped in the usual sterile fashion.    Large Joint Inj: L  glenohumeral on 08/06/2023 3:54 PM Indications: pain Details: 22 G 1.5 in needle, ultrasound-guided anterior approach  Arthrogram: No  Medications: 4 mL lidocaine 1 %; 80 mg triamcinolone acetonide 40 MG/ML Outcome: tolerated well, no immediate complications Procedure, treatment alternatives, risks and benefits explained, specific risks discussed. Consent was given by the patient. Immediately prior to procedure a time out was called to verify the correct patient, procedure, equipment, support staff and site/side marked as required. Patient was prepped and draped in the usual sterile fashion.        After a lengthy discussion of treatment options, including risks, benefits, alternatives, complications of surgical and nonsurgical conservative options, the patient elected surgical repair.   The patient  is aware of the material risks  and complications including, but not limited to injury to adjacent structures, neurovascular injury, infection, numbness, bleeding, implant failure, thermal burns, stiffness, persistent pain, failure to heal, disease transmission from allograft, need for further surgery, dislocation, anesthetic risks, blood clots, risks of death,and others. The probabilities of surgical success and failure discussed with patient given their particular co-morbidities.The time  and nature of expected rehabilitation and recovery was discussed.The patient's questions were all answered preoperatively.  No barriers to understanding were noted. I explained the natural history of the disease process and Rx rationale.  I explained to the patient what I considered to be reasonable expectations given their personal situation.  The final treatment plan was arrived at through a shared patient decision making process model.      I personally saw and evaluated the patient, and participated in the management and treatment plan.  Huel Cote, MD Attending Physician, Orthopedic Surgery  This  document was dictated using Dragon voice recognition software. A reasonable attempt at proof reading has been made to minimize errors.

## 2023-08-10 ENCOUNTER — Encounter (HOSPITAL_BASED_OUTPATIENT_CLINIC_OR_DEPARTMENT_OTHER): Payer: Medicare Other

## 2023-08-13 ENCOUNTER — Encounter (HOSPITAL_BASED_OUTPATIENT_CLINIC_OR_DEPARTMENT_OTHER): Payer: Medicare Other

## 2023-09-08 ENCOUNTER — Ambulatory Visit (HOSPITAL_BASED_OUTPATIENT_CLINIC_OR_DEPARTMENT_OTHER): Payer: Medicare Other | Admitting: Orthopaedic Surgery

## 2023-10-11 ENCOUNTER — Telehealth: Payer: Self-pay

## 2023-10-11 NOTE — Telephone Encounter (Signed)
Patient called Triage line. States she would like to cancel her Appt for 10/13/2023. Also would like for me to send message asking when she is able to get shoulder injections.   [phone]

## 2023-10-12 NOTE — Telephone Encounter (Signed)
Callled and informed pt injections can be done every 3 months and scheduled her mid december

## 2023-10-13 ENCOUNTER — Ambulatory Visit (HOSPITAL_BASED_OUTPATIENT_CLINIC_OR_DEPARTMENT_OTHER): Payer: Medicare Other | Admitting: Orthopaedic Surgery

## 2023-11-04 ENCOUNTER — Ambulatory Visit
Admission: RE | Admit: 2023-11-04 | Discharge: 2023-11-04 | Disposition: A | Discharge status: Home / Self Care | Payer: Medicare Other | Source: Ambulatory Visit | Attending: Internal Medicine | Admitting: Internal Medicine

## 2023-11-04 DIAGNOSIS — M810 Age-related osteoporosis without current pathological fracture: Secondary | ICD-10-CM

## 2023-11-10 ENCOUNTER — Ambulatory Visit (HOSPITAL_BASED_OUTPATIENT_CLINIC_OR_DEPARTMENT_OTHER): Payer: Medicare Other | Admitting: Orthopaedic Surgery

## 2023-11-10 DIAGNOSIS — G8929 Other chronic pain: Secondary | ICD-10-CM

## 2023-11-10 DIAGNOSIS — M25511 Pain in right shoulder: Secondary | ICD-10-CM | POA: Diagnosis not present

## 2023-11-10 DIAGNOSIS — M25512 Pain in left shoulder: Secondary | ICD-10-CM

## 2023-11-10 MED ORDER — LIDOCAINE HCL 1 % IJ SOLN
4.0000 mL | INTRAMUSCULAR | Status: AC | PRN
  Administered 2023-11-10: 4 mL

## 2023-11-10 MED ORDER — TRIAMCINOLONE ACETONIDE 40 MG/ML IJ SUSP
80.0000 mg | INTRAMUSCULAR | Status: AC | PRN
  Administered 2023-11-10: 80 mg via INTRA_ARTICULAR

## 2023-11-10 NOTE — Progress Notes (Signed)
Chief Complaint: Right shoulder pain     History of Present Illness:   11/10/2023: Today for bilateral shoulder injections.  [name] is a 72 y.o. female right-hand-dominant female presents with ongoing right shoulder pain that has been painful for the last year and worse over the last 3 months.  She is having pain with any type of overhead activity or range of motion.  She has been having multiple injections with Dr. Magnus Ivan which has given her relief although these are no longer giving her any type of significant relief.  She is on long-term narcotics from issues with her back.  She is scheduled to undergo radiofrequency ablation in July of this year.  She is having a difficult time reaching overhead or doing her hair.  She states that this has been really bothersome lately and she is no longer able to even lay on the side.    Surgical History:   none  PMH/PSH/Family History/Social History/Meds/Allergies:    Past Medical History:  Diagnosis Date  . Anxiety    states frequent  anxiety attacks  . Asthma   . Cancer (HCC)    HX OF THYROID CANCER   . Cold no drainage   since thursday 07-21-12, chest xray done 07-24-2012   . COPD (chronic obstructive pulmonary disease) (HCC)    LOV with PFT 1/13  Dr Cherie Ouch on chart-   pt states no changes since last saw pulmonary  . Depression   . Fibromyalgia    states has had trigger point injections shoulders-   . GERD (gastroesophageal reflux disease)   . Headache(784.0)    migraines  . History of DVT of lower extremity 1972   right leg  . Hyperlipidemia   . Hypothyroidism   . OA (osteoarthritis)   . On home oxygen therapy    2 liters per minute per [place] as needed  . Pneumonia    2008  . Seasonal allergies    sgtates clear nasal drainage x 3- days- states no fever  . Shortness of breath    oxygen at 2 liters at night, WITH EXERTION    Past Surgical History:  Procedure Laterality Date   . ABDOMINAL HYSTERECTOMY  1990   bilateral salpingo-oophorectomy  . BARTHOLIN GLAND CYST EXCISION  1977   PRIOR MARSIPULIZATION IN 1974  . BOWEL SURGERY FOR OBSTRUCTION  1992  . CLOSED MANIPULATION RIGHT KNEE REPLACEMENT  09-10-2010  . DIAGNOSTIC LAPAROSCOPY  1989   x2-3 with ovarian cystectomy  . FINGER ARTHROPLASTY  07/29/2012   Procedure: FINGER ARTHROPLASTY;  Surgeon: Drucilla Schmidt, MD;  Location: [facility];  Service: Orthopedics;  Laterality: Left;  Left Thumb, CMC Interpositional Arthroplasty Left Thumb,with Palmaris Longis Tendon Graft, IPSI Lateral     . HEMORRHOID SURGERY  1988  . KNEE ARTHROSCOPY  10-26-2008   AND REMOVAL 2 SCREWS PROXIMAL TIBIA  OF RIGHT KNEE  . KNEE ARTHROTOMY  LAST ONE 2000   right x 3 prior to replacement  . LEFT SHOULDER ARTHROSCOPY  03-02-2012  . THYROID LOBECTOMY  2009   PARTIAL LEFT LOWER LOBECTOMY  . TONSILLECTOMY  1959  . TOTAL KNEE ARTHROPLASTY  07-31-2010   OA  RIGHT KNEE  . TOTAL KNEE ARTHROPLASTY Left 10/03/2020   Procedure: LEFT TOTAL KNEE ARTHROPLASTY;  Surgeon: Kathryne Hitch, MD;  Location:  [facility];  Service: Orthopedics;  Laterality: Left;   Social History   Socioeconomic History  . Marital status: Married    Spouse name: Not on file  . Number of children: Not on file  . Years of education: Not on file  . Highest education level: Not on file  Occupational History  . Not on file  Tobacco Use  . Smoking status: Former    Current packs/day: 0.00    Average packs/day: 1 pack/day for 43.0 years (43.0 ttl pk-yrs)    Types: Cigarettes    Quit date: 09/23/2021    Years since quitting: 2.1  . Smokeless tobacco: Never  Vaping Use  . Vaping status: Never Used  Substance and Sexual Activity  . Alcohol use: No  . Drug use: Yes    Types: Marijuana    Comment: smokes once every 3 weeks  . Sexual activity: Not on file  Other Topics Concern  . Not on file  Social History Narrative  . Not on file   Social Drivers of Health    Financial Resource Strain: Not on file  Food Insecurity: Not on file  Transportation Needs: Not on file  Physical Activity: Not on file  Stress: Not on file  Social Connections: Not on file   No family history on file. Allergies  Allergen Reactions  . Codeine Nausea And Vomiting  . Doxycycline Nausea And Vomiting    Pt states she can tolerate this  . Penicillins Other (See Comments)    Family cannot take / update 03/02/12 - pt. does not remember the reaction.  . Sulfa Antibiotics Nausea And Vomiting  . Tylenol [Acetaminophen] Other (See Comments)    Cannot take with Neurontin / update 03/02/12 - causes diarrhea  . Vistaril [Hydroxyzine Hcl] Other (See Comments)     Hallucinations   Current Outpatient Medications  Medication Sig Dispense Refill  . albuterol (PROVENTIL HFA;VENTOLIN HFA) 108 (90 BASE) MCG/ACT inhaler Inhale 2 puffs into the lungs every 6 (six) hours as needed for wheezing or shortness of breath.     Marland Kitchen albuterol (PROVENTIL) (2.5 MG/3ML) 0.083% nebulizer solution Take 2.5 mg by nebulization every 6 (six) hours as needed for shortness of breath or wheezing.    . Ascorbic Acid (VITAMIN C) 1000 MG tablet Take 1,000 mg by mouth 2 (two) times daily.    Marland Kitchen aspirin 325 MG EC tablet TAKE 1 TABLET BY MOUTH TWICE A DAY AFTER MEAL (Patient taking differently: 325 mg every 6 (six) hours as needed for moderate pain.) 30 tablet 0  . atorvastatin (LIPITOR) 20 MG tablet Take 10 mg by mouth daily before breakfast.     . B Complex-C (B-COMPLEX WITH VITAMIN C) tablet Take 1 tablet by mouth daily.    Marland Kitchen buPROPion (WELLBUTRIN XL) 300 MG 24 hr tablet Take 300 mg by mouth daily.    . butalbital-aspirin-caffeine (FIORINAL) 50-325-40 MG capsule Take 1 capsule by mouth 2 (two) times daily as needed for headache.    . clonazePAM (KLONOPIN) 0.5 MG tablet Take 0.25-0.5 mg by mouth 3 (three) times daily as needed for anxiety.     Marland Kitchen escitalopram (LEXAPRO) 10 MG tablet Take 10 mg by mouth daily.    Marland Kitchen  estradiol (ESTRACE) 0.5 MG tablet Take 0.5 mg by mouth daily.    . famotidine (PEPCID) 40 MG tablet Take 40 mg by mouth 2 (two) times daily.    . fentaNYL (DURAGESIC) 25 MCG/HR Place 1 patch onto the skin every other day.    Marland Kitchen  fexofenadine (ALLEGRA) 180 MG tablet Take 180 mg by mouth daily.    . fluticasone (FLONASE) 50 MCG/ACT nasal spray Place 1 spray into both nostrils daily as needed for allergies.     . Fluticasone-Umeclidin-Vilant 100-62.5-25 MCG/INH AEPB Inhale 1 puff into the lungs daily.     Marland Kitchen gabapentin (NEURONTIN) 300 MG capsule Take 300 mg by mouth 2 (two) times daily.    Marland Kitchen glycopyrrolate (ROBINUL) 1 MG tablet Take 1 mg by mouth at bedtime.    Marland Kitchen HYDROmorphone (DILAUDID) 2 MG tablet Take 2 mg by mouth 4 (four) times daily as needed for moderate pain.    Marland Kitchen HYDROmorphone (DILAUDID) 4 MG tablet Take 1 tablet (4 mg total) by mouth every 4 (four) hours as needed for severe pain (if oxycodone not effective). (Patient not taking: Reported on 06/22/2023) 30 tablet 0  . hydroxypropyl methylcellulose / hypromellose (ISOPTO TEARS / GONIOVISC) 2.5 % ophthalmic solution Place 1 drop into both eyes as needed for dry eyes.    Marland Kitchen levothyroxine (SYNTHROID, LEVOTHROID) 25 MCG tablet Take 25 mcg by mouth daily before breakfast.    . Multiple Vitamin (MULTIVITAMIN WITH MINERALS) TABS tablet Take 1 tablet by mouth daily.    . Multiple Vitamins-Minerals (PRESERVISION AREDS 2 PO) Take 1 capsule by mouth in the morning and at bedtime.    . naratriptan (AMERGE) 2.5 MG tablet Take 2.5 mg by mouth as needed for migraine.     Marland Kitchen NARCAN 4 MG/0.1ML LIQD nasal spray kit Place 0.4 mg into the nose once.     . OXYGEN Inhale 3 L into the lungs as needed (shortness of breath).    Marland Kitchen rOPINIRole (REQUIP) 2 MG tablet Take 4 mg by mouth at bedtime.    . traZODone (DESYREL) 50 MG tablet Take 50-100 mg by mouth at bedtime.    . valACYclovir (VALTREX) 1000 MG tablet Take 1,000 mg by mouth 2 (two) times daily as needed (outbreak).      . verapamil (CALAN-SR) 120 MG CR tablet Take 120 mg by mouth daily.     No current facility-administered medications for this visit.   No results found.  Review of Systems:   A ROS was performed including pertinent positives and negatives as documented in the HPI.  Physical Exam :   Constitutional: NAD and appears stated age Neurological: Alert and oriented Psych: Appropriate affect and cooperative There were no vitals taken for this visit.   Comprehensive Musculoskeletal Exam:    Musculoskeletal Exam    Inspection Right Left  Skin No atrophy or winging No atrophy or winging  Palpation    Tenderness Glenohumeral Glenohumeral  Range of Motion    Flexion (passive) 160 160  Flexion (active) 150 150  Abduction 140 140  ER at the side 50 50  Can reach behind back to L1 L1  Strength     4 out of 5 with pain and forward elevation Full  Special Tests    Pseudoparalytic No No  Neurologic    Fires PIN, radial, median, ulnar, musculocutaneous, axillary, suprascapular, long thoracic, and spinal accessory innervated muscles. No abnormal sensibility  Vascular/Lymphatic    Radial Pulse 2+ 2+  Cervical Exam    Patient has symmetric cervical range of motion with negative Spurling's test.  Special Test:positive Neer impingement     Imaging:   Xray (right shoulder 3 views): Acromiohumeral interval narrowing with significant glenohumeral degenerative findings  MRI (right shoulder): Severe glenohumeral osteoarthritis with superior migration of the humeral head in the  setting of a thin rotator cuff with cystic changes involving the insertion  I personally reviewed and interpreted the radiographs.   Assessment:   72 y.o. female right-hand-dominant female with right shoulder pain in the setting of severe glenohumeral osteoarthritis as well as superior humeral migration consistent with early rotator cuff arthropathy.  At today's visit she has had multiple injections which are no longer  effective for her.  Given this we did discuss additional treatment options.  I discussed that I am somewhat hesitant the results that would be associated with physical therapy as I do believe that she is developing rotator cuff arthropathy that I do believe her pain would be quite significant as she works for range of motion and strengthening.  Given this we did discuss possible reverse shoulder arthroplasty.  I did discuss that she would need to be on a slightly increased post operative narcotic regimen although she would undergo a nerve block to help with this.  After long discussion she has elected for right shoulder reverse shoulder arthroplasty  At today's visit she is requesting bilateral ultrasound-guided injections which she would like to proceed with today.  We will plan for this.  I would like to do a full workup on her left shoulder during her next follow-up visit as well.  Plan :    -Bilateral sound guided injections performed referable to some retained    Procedure Note  Patient: [name]             Date of Birth: 07-Oct-1951           MRN: [id number]             Visit Date: 11/10/2023  Procedures: Visit Diagnoses: No diagnosis found.  Large Joint Inj: R glenohumeral on 11/10/2023 5:21 PM Indications: pain Details: 22 G 1.5 in needle, ultrasound-guided anterior approach  Arthrogram: No  Medications: 4 mL lidocaine 1 %; 80 mg triamcinolone acetonide 40 MG/ML Outcome: tolerated well, no immediate complications Procedure, treatment alternatives, risks and benefits explained, specific risks discussed. Consent was given by the patient. Immediately prior to procedure a time out was called to verify the correct patient, procedure, equipment, support staff and site/side marked as required. Patient was prepped and draped in the usual sterile fashion.    Large Joint Inj: L glenohumeral on 11/10/2023 5:21 PM Indications: pain Details: 22 G 1.5 in needle, ultrasound-guided  anterior approach  Arthrogram: No  Medications: 4 mL lidocaine 1 %; 80 mg triamcinolone acetonide 40 MG/ML Outcome: tolerated well, no immediate complications Procedure, treatment alternatives, risks and benefits explained, specific risks discussed. Consent was given by the patient. Immediately prior to procedure a time out was called to verify the correct patient, procedure, equipment, support staff and site/side marked as required. Patient was prepped and draped in the usual sterile fashion.       After a lengthy discussion of treatment options, including risks, benefits, alternatives, complications of surgical and nonsurgical conservative options, the patient elected surgical repair.   The patient  is aware of the material risks  and complications including, but not limited to injury to adjacent structures, neurovascular injury, infection, numbness, bleeding, implant failure, thermal burns, stiffness, persistent pain, failure to heal, disease transmission from allograft, need for further surgery, dislocation, anesthetic risks, blood clots, risks of death,and others. The probabilities of surgical success and failure discussed with patient given their particular co-morbidities.The time and nature of expected rehabilitation and recovery was discussed.The patient's questions were all answered preoperatively.  No barriers  to understanding were noted. I explained the natural history of the disease process and Rx rationale.  I explained to the patient what I considered to be reasonable expectations given their personal situation.  The final treatment plan was arrived at through a shared patient decision making process model.      I personally saw and evaluated the patient, and participated in the management and treatment plan.  Huel Cote, MD Attending Physician, Orthopedic Surgery  This document was dictated using Dragon voice recognition software. A reasonable attempt at proof reading has  been made to minimize errors.

## 2023-12-15 ENCOUNTER — Ambulatory Visit (HOSPITAL_BASED_OUTPATIENT_CLINIC_OR_DEPARTMENT_OTHER): Payer: Medicare Other

## 2023-12-15 ENCOUNTER — Ambulatory Visit (HOSPITAL_BASED_OUTPATIENT_CLINIC_OR_DEPARTMENT_OTHER): Payer: Medicare Other | Admitting: Orthopaedic Surgery

## 2023-12-15 ENCOUNTER — Ambulatory Visit (HOSPITAL_BASED_OUTPATIENT_CLINIC_OR_DEPARTMENT_OTHER): Payer: Self-pay | Admitting: Orthopaedic Surgery

## 2023-12-15 DIAGNOSIS — M19012 Primary osteoarthritis, left shoulder: Secondary | ICD-10-CM

## 2023-12-15 DIAGNOSIS — G8929 Other chronic pain: Secondary | ICD-10-CM

## 2023-12-15 DIAGNOSIS — M25512 Pain in left shoulder: Secondary | ICD-10-CM

## 2023-12-15 DIAGNOSIS — M25511 Pain in right shoulder: Secondary | ICD-10-CM | POA: Diagnosis not present

## 2023-12-15 NOTE — Progress Notes (Signed)
Chief Complaint: Bilateral shoulder pain     History of Present Illness:   12/15/2023: Today predominantly for discussion of the left shoulder as she is still having persistent overhead pain in the joint which is now causing significant trapezial type pain she is hoping to discuss surgery for the left shoulder  [name] is a 73 y.o. female right-hand-dominant female presents with ongoing right shoulder pain that has been painful for the last year and worse over the last 3 months.  She is having pain with any type of overhead activity or range of motion.  She has been having multiple injections with Dr. Magnus Ivan which has given her relief although these are no longer giving her any type of significant relief.  She is on long-term narcotics from issues with her back.  She is scheduled to undergo radiofrequency ablation in July of this year.  She is having a difficult time reaching overhead or doing her hair.  She states that this has been really bothersome lately and she is no longer able to even lay on the side.    Surgical History:   none  PMH/PSH/Family History/Social History/Meds/Allergies:    Past Medical History:  Diagnosis Date   Anxiety    states frequent  anxiety attacks   Asthma    Cancer (HCC)    HX OF THYROID CANCER    Cold no drainage   since thursday 07-21-12, chest xray done 07-24-2012    COPD (chronic obstructive pulmonary disease) (HCC)    LOV with PFT 1/13  Dr Cherie Ouch on chart-   pt states no changes since last saw pulmonary   Depression    Fibromyalgia    states has had trigger point injections shoulders-    GERD (gastroesophageal reflux disease)    Headache(784.0)    migraines   History of DVT of lower extremity 1972   right leg   Hyperlipidemia    Hypothyroidism    OA (osteoarthritis)    On home oxygen therapy    2 liters per minute per [place] as needed   Pneumonia    2008   Seasonal allergies    sgtates clear  nasal drainage x 3- days- states no fever   Shortness of breath    oxygen at 2 liters at night, WITH EXERTION    Past Surgical History:  Procedure Laterality Date   ABDOMINAL HYSTERECTOMY  1990   bilateral salpingo-oophorectomy   BARTHOLIN GLAND CYST EXCISION  1977   PRIOR MARSIPULIZATION IN 1974   BOWEL SURGERY FOR OBSTRUCTION  1992   CLOSED MANIPULATION RIGHT KNEE REPLACEMENT  09-10-2010   DIAGNOSTIC LAPAROSCOPY  1989   x2-3 with ovarian cystectomy   FINGER ARTHROPLASTY  07/29/2012   Procedure: FINGER ARTHROPLASTY;  Surgeon: Drucilla Schmidt, MD;  Location: [facility];  Service: Orthopedics;  Laterality: Left;  Left Thumb, CMC Interpositional Arthroplasty Left Thumb,with Palmaris Longis Tendon Graft, IPSI Lateral      HEMORRHOID SURGERY  1988   KNEE ARTHROSCOPY  10-26-2008   AND REMOVAL 2 SCREWS PROXIMAL TIBIA  OF RIGHT KNEE   KNEE ARTHROTOMY  LAST ONE 2000   right x 3 prior to replacement   LEFT SHOULDER ARTHROSCOPY  03-02-2012   THYROID LOBECTOMY  2009   PARTIAL LEFT LOWER LOBECTOMY   TONSILLECTOMY  1959   TOTAL  KNEE ARTHROPLASTY  07-31-2010   OA  RIGHT KNEE   TOTAL KNEE ARTHROPLASTY Left 10/03/2020   Procedure: LEFT TOTAL KNEE ARTHROPLASTY;  Surgeon: Kathryne Hitch, MD;  Location: [facility];  Service: Orthopedics;  Laterality: Left;   Social History   Socioeconomic History   Marital status: Married    Spouse name: Not on file   Number of children: Not on file   Years of education: Not on file   Highest education level: Not on file  Occupational History   Not on file  Tobacco Use   Smoking status: Former    Current packs/day: 0.00    Average packs/day: 1 pack/day for 43.0 years (43.0 ttl pk-yrs)    Types: Cigarettes    Quit date: 09/23/2021    Years since quitting: 2.2   Smokeless tobacco: Never  Vaping Use   Vaping status: Never Used  Substance and Sexual Activity   Alcohol use: No   Drug use: Yes    Types: Marijuana    Comment: smokes once every 3 weeks    Sexual activity: Not on file  Other Topics Concern   Not on file  Social History Narrative   Not on file   Social Drivers of Health   Financial Resource Strain: Not on file  Food Insecurity: Not on file  Transportation Needs: Not on file  Physical Activity: Not on file  Stress: Not on file  Social Connections: Not on file   No family history on file. Allergies  Allergen Reactions   Codeine Nausea And Vomiting   Doxycycline Nausea And Vomiting    Pt states she can tolerate this   Penicillins Other (See Comments)    Family cannot take / update 03/02/12 - pt. does not remember the reaction.   Sulfa Antibiotics Nausea And Vomiting   Tylenol [Acetaminophen] Other (See Comments)    Cannot take with Neurontin / update 03/02/12 - causes diarrhea   Vistaril [Hydroxyzine Hcl] Other (See Comments)     Hallucinations   Current Outpatient Medications  Medication Sig Dispense Refill   albuterol (PROVENTIL HFA;VENTOLIN HFA) 108 (90 BASE) MCG/ACT inhaler Inhale 2 puffs into the lungs every 6 (six) hours as needed for wheezing or shortness of breath.      albuterol (PROVENTIL) (2.5 MG/3ML) 0.083% nebulizer solution Take 2.5 mg by nebulization every 6 (six) hours as needed for shortness of breath or wheezing.     Ascorbic Acid (VITAMIN C) 1000 MG tablet Take 1,000 mg by mouth 2 (two) times daily.     aspirin 325 MG EC tablet TAKE 1 TABLET BY MOUTH TWICE A DAY AFTER MEAL (Patient taking differently: 325 mg every 6 (six) hours as needed for moderate pain.) 30 tablet 0   atorvastatin (LIPITOR) 20 MG tablet Take 10 mg by mouth daily before breakfast.      B Complex-C (B-COMPLEX WITH VITAMIN C) tablet Take 1 tablet by mouth daily.     buPROPion (WELLBUTRIN XL) 300 MG 24 hr tablet Take 300 mg by mouth daily.     butalbital-aspirin-caffeine (FIORINAL) 50-325-40 MG capsule Take 1 capsule by mouth 2 (two) times daily as needed for headache.     clonazePAM (KLONOPIN) 0.5 MG tablet Take 0.25-0.5 mg by mouth  3 (three) times daily as needed for anxiety.      escitalopram (LEXAPRO) 10 MG tablet Take 10 mg by mouth daily.     estradiol (ESTRACE) 0.5 MG tablet Take 0.5 mg by mouth daily.     famotidine (  PEPCID) 40 MG tablet Take 40 mg by mouth 2 (two) times daily.     fentaNYL (DURAGESIC) 25 MCG/HR Place 1 patch onto the skin every other day.     fexofenadine (ALLEGRA) 180 MG tablet Take 180 mg by mouth daily.     fluticasone (FLONASE) 50 MCG/ACT nasal spray Place 1 spray into both nostrils daily as needed for allergies.      Fluticasone-Umeclidin-Vilant 100-62.5-25 MCG/INH AEPB Inhale 1 puff into the lungs daily.      gabapentin (NEURONTIN) 300 MG capsule Take 300 mg by mouth 2 (two) times daily.     glycopyrrolate (ROBINUL) 1 MG tablet Take 1 mg by mouth at bedtime.     HYDROmorphone (DILAUDID) 2 MG tablet Take 2 mg by mouth 4 (four) times daily as needed for moderate pain.     HYDROmorphone (DILAUDID) 4 MG tablet Take 1 tablet (4 mg total) by mouth every 4 (four) hours as needed for severe pain (if oxycodone not effective). (Patient not taking: Reported on 06/22/2023) 30 tablet 0   hydroxypropyl methylcellulose / hypromellose (ISOPTO TEARS / GONIOVISC) 2.5 % ophthalmic solution Place 1 drop into both eyes as needed for dry eyes.     levothyroxine (SYNTHROID, LEVOTHROID) 25 MCG tablet Take 25 mcg by mouth daily before breakfast.     Multiple Vitamin (MULTIVITAMIN WITH MINERALS) TABS tablet Take 1 tablet by mouth daily.     Multiple Vitamins-Minerals (PRESERVISION AREDS 2 PO) Take 1 capsule by mouth in the morning and at bedtime.     naratriptan (AMERGE) 2.5 MG tablet Take 2.5 mg by mouth as needed for migraine.      NARCAN 4 MG/0.1ML LIQD nasal spray kit Place 0.4 mg into the nose once.      OXYGEN Inhale 3 L into the lungs as needed (shortness of breath).     rOPINIRole (REQUIP) 2 MG tablet Take 4 mg by mouth at bedtime.     traZODone (DESYREL) 50 MG tablet Take 50-100 mg by mouth at bedtime.      valACYclovir (VALTREX) 1000 MG tablet Take 1,000 mg by mouth 2 (two) times daily as needed (outbreak).      verapamil (CALAN-SR) 120 MG CR tablet Take 120 mg by mouth daily.     No current facility-administered medications for this visit.   No results found.  Review of Systems:   A ROS was performed including pertinent positives and negatives as documented in the HPI.  Physical Exam :   Constitutional: NAD and appears stated age Neurological: Alert and oriented Psych: Appropriate affect and cooperative There were no vitals taken for this visit.   Comprehensive Musculoskeletal Exam:    Musculoskeletal Exam    Inspection Right Left  Skin No atrophy or winging No atrophy or winging  Palpation    Tenderness Glenohumeral Glenohumeral  Range of Motion    Flexion (passive) 160 160  Flexion (active) 150 150  Abduction 140 140  ER at the side 50 50  Can reach behind back to L1 L1  Strength     4 out of 5 with pain and forward elevation Full  Special Tests    Pseudoparalytic No No  Neurologic    Fires PIN, radial, median, ulnar, musculocutaneous, axillary, suprascapular, long thoracic, and spinal accessory innervated muscles. No abnormal sensibility  Vascular/Lymphatic    Radial Pulse 2+ 2+  Cervical Exam    Patient has symmetric cervical range of motion with negative Spurling's test.  Special Test:positive Neer impingement  Imaging:   Xray (right shoulder 3 views 3 views left shoulder): Acromiohumeral interval narrowing with significant glenohumeral degenerative findings bilateral shoulders  MRI (right shoulder): Severe glenohumeral osteoarthritis with superior migration of the humeral head in the setting of a thin rotator cuff with cystic changes involving the insertion  I personally reviewed and interpreted the radiographs.   Assessment:   73 y.o. female right-hand-dominant female with right as well as left shoulder pain in the setting of severe glenohumeral  osteoarthritis.  At today's visit she has had multiple injections which are no longer effective for her.  Given this we did discuss additional treatment options.  I discussed that I am somewhat hesitant the results that would be associated with physical therapy as I do believe that she is developing rotator cuff arthropathy that I do believe her pain would be quite significant as she works for range of motion and strengthening.  Given this we did discuss possible reverse shoulder arthroplasty.  At this time she has had multiple injections in the left shoulder and I did specifically discuss that ultimately I do believe she would be a candidate for left shoulder reverse shoulder arthroplasty.  She would like to proceed with this   Plan :    -Plan for left shoulder reverse shoulder arthroplasty      After a lengthy discussion of treatment options, including risks, benefits, alternatives, complications of surgical and nonsurgical conservative options, the patient elected surgical repair.   The patient  is aware of the material risks  and complications including, but not limited to injury to adjacent structures, neurovascular injury, infection, numbness, bleeding, implant failure, thermal burns, stiffness, persistent pain, failure to heal, disease transmission from allograft, need for further surgery, dislocation, anesthetic risks, blood clots, risks of death,and others. The probabilities of surgical success and failure discussed with patient given their particular co-morbidities.The time and nature of expected rehabilitation and recovery was discussed.The patient's questions were all answered preoperatively.  No barriers to understanding were noted. I explained the natural history of the disease process and Rx rationale.  I explained to the patient what I considered to be reasonable expectations given their personal situation.  The final treatment plan was arrived at through a shared patient decision  making process model.      I personally saw and evaluated the patient, and participated in the management and treatment plan.  Huel Cote, MD Attending Physician, Orthopedic Surgery  This document was dictated using Dragon voice recognition software. A reasonable attempt at proof reading has been made to minimize errors.

## 2023-12-24 ENCOUNTER — Other Ambulatory Visit (HOSPITAL_BASED_OUTPATIENT_CLINIC_OR_DEPARTMENT_OTHER): Payer: Self-pay | Admitting: Orthopaedic Surgery

## 2023-12-24 DIAGNOSIS — M19012 Primary osteoarthritis, left shoulder: Secondary | ICD-10-CM

## 2023-12-27 ENCOUNTER — Ambulatory Visit (HOSPITAL_BASED_OUTPATIENT_CLINIC_OR_DEPARTMENT_OTHER)
Admission: RE | Admit: 2023-12-27 | Discharge: 2023-12-27 | Disposition: A | Discharge status: Home / Self Care | Payer: Medicare Other | Source: Ambulatory Visit | Attending: Orthopaedic Surgery | Admitting: Orthopaedic Surgery

## 2023-12-27 ENCOUNTER — Ambulatory Visit (HOSPITAL_BASED_OUTPATIENT_CLINIC_OR_DEPARTMENT_OTHER): Payer: Medicare Other | Admitting: Physical Therapy

## 2023-12-27 DIAGNOSIS — G8929 Other chronic pain: Secondary | ICD-10-CM | POA: Diagnosis present

## 2023-12-27 DIAGNOSIS — M25512 Pain in left shoulder: Secondary | ICD-10-CM | POA: Diagnosis present

## 2023-12-27 DIAGNOSIS — M25511 Pain in right shoulder: Secondary | ICD-10-CM | POA: Diagnosis present

## 2023-12-27 NOTE — Therapy (Incomplete)
OUTPATIENT PHYSICAL THERAPY EVALUATION   Patient Name: [name] MRN: [id number] DOB:1951/05/15, 73 y.o., female Today's Date: 12/27/2023  END OF SESSION:   Past Medical History:  Diagnosis Date   Anxiety    states frequent  anxiety attacks   Asthma    Cancer (HCC)    HX OF THYROID CANCER    Cold no drainage   since thursday 07-21-12, chest xray done 07-24-2012    COPD (chronic obstructive pulmonary disease) (HCC)    LOV with PFT 1/13  Dr Cherie Ouch on chart-   pt states no changes since last saw pulmonary   Depression    Fibromyalgia    states has had trigger point injections shoulders-    GERD (gastroesophageal reflux disease)    Headache(784.0)    migraines   History of DVT of lower extremity 1972   right leg   Hyperlipidemia    Hypothyroidism    OA (osteoarthritis)    On home oxygen therapy    2 liters per minute per [place] as needed   Pneumonia    2008   Seasonal allergies    sgtates clear nasal drainage x 3- days- states no fever   Shortness of breath    oxygen at 2 liters at night, WITH EXERTION    Past Surgical History:  Procedure Laterality Date   ABDOMINAL HYSTERECTOMY  1990   bilateral salpingo-oophorectomy   BARTHOLIN GLAND CYST EXCISION  1977   PRIOR MARSIPULIZATION IN 1974   BOWEL SURGERY FOR OBSTRUCTION  1992   CLOSED MANIPULATION RIGHT KNEE REPLACEMENT  09-10-2010   DIAGNOSTIC LAPAROSCOPY  1989   x2-3 with ovarian cystectomy   FINGER ARTHROPLASTY  07/29/2012   Procedure: FINGER ARTHROPLASTY;  Surgeon: Drucilla Schmidt, MD;  Location: [facility];  Service: Orthopedics;  Laterality: Left;  Left Thumb, CMC Interpositional Arthroplasty Left Thumb,with Palmaris Longis Tendon Graft, IPSI Lateral      HEMORRHOID SURGERY  1988   KNEE ARTHROSCOPY  10-26-2008   AND REMOVAL 2 SCREWS PROXIMAL TIBIA  OF RIGHT KNEE   KNEE ARTHROTOMY  LAST ONE 2000   right x 3 prior to replacement   LEFT SHOULDER ARTHROSCOPY  03-02-2012   THYROID LOBECTOMY  2009   PARTIAL  LEFT LOWER LOBECTOMY   TONSILLECTOMY  1959   TOTAL KNEE ARTHROPLASTY  07-31-2010   OA  RIGHT KNEE   TOTAL KNEE ARTHROPLASTY Left 10/03/2020   Procedure: LEFT TOTAL KNEE ARTHROPLASTY;  Surgeon: Kathryne Hitch, MD;  Location: [facility];  Service: Orthopedics;  Laterality: Left;   Patient Active Problem List   Diagnosis Date Noted   Hyperlipidemia 12/31/2021   Atypical chest pain 12/31/2021   Tobacco abuse 12/31/2021   Status post total left knee replacement 10/04/2020   Unilateral primary osteoarthritis, left knee 10/03/2020   Status post total knee replacement, left 10/03/2020   Constipation 09/25/2020   Exocrine pancreatic insufficiency 09/25/2020   Unilateral primary osteoarthritis, left hip 08/22/2020   Chronic pain of left knee 08/22/2020   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   Epigastric pain 03/08/2020   Impingement syndrome of both shoulders 08/04/2018   Chronic tension-type headache, not intractable 12/02/2017   Bilateral elbow joint pain 08/11/2017   Chronic pain of both shoulders 08/11/2017   Chronic fatigue syndrome with fibromyalgia 05/29/2015   Back muscle spasm 04/05/2015   Chronic migraine without aura without status migrainosus, not intractable 07/01/2013   Depression 05/08/2013   Lumbar facet arthropathy 04/13/2013   Chronic, continuous use of opioids 01/12/2013  DDD (degenerative disc disease), cervical 01/12/2013   Raynaud's disease 01/12/2013   Right knee pain 01/12/2013   Scoliosis 01/12/2013   Lesion of lip 05/30/2012   Dysphonia 09/28/2011   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  ONSET DATE: will be post op 3/3   SUBJECTIVE:                                                                                                                                                                                            SUBJECTIVE STATEMENT: ***  PERTINENT HISTORY:  ***  PAIN:  Are you having pain? {OPRCPAIN:27236}  PRECAUTIONS:  {Therapy precautions:24002}  RED FLAGS: {PT Red Flags:29287}   WEIGHT BEARING RESTRICTIONS:  {Yes ***/No:24003}  FALLS:  Has patient fallen in last 6 months? {fallsyesno:27318}  LIVING ENVIRONMENT: Lives with: {OPRC lives with:25569::"lives with their family"} Lives in: {Lives in:25570} Stairs: {opstairs:27293} Has following equipment at home: {Assistive devices:23999}  OCCUPATION:  ***  PLOF:  {PLOF:24004}  PATIENT GOALS:  ***  OBJECTIVE:  Note: Objective measures were completed at Evaluation unless otherwise noted.  DIAGNOSTIC FINDINGS:  ***  PATIENT SURVEYS:  {rehab surveys:24030}  COGNITIVE STATUS: {cognition:24006}   SENSATION: {sensation:27233}  EDEMA:  {Yes/No:[id number]}  POSTURE:  {posture:25561}  HAND DOMINANCE:  {MISC; OT HAND DOMINANCE:[phone]}  GAIT: Distance walked: *** Assistive device utilized: {Assistive devices:23999} Level of assistance: {Levels of assistance:24026} Comments: ***   {OPRC PT body part select:29305}                                                                                                                              TREATMENT DATE: ***     PATIENT EDUCATION:  Education details: *** Person educated: {Person educated:25204} Education method: {Education Method:25205} Education comprehension: {Education Comprehension:25206}  HOME EXERCISE PROGRAM: ***   ASSESSMENT:  CLINICAL  IMPRESSION: Patient is a *** y.o. *** who was seen today for physical therapy evaluation and treatment for ***.    OBJECTIVE IMPAIRMENTS: {opptimpairments:25111}.   ACTIVITY LIMITATIONS: {activitylimitations:27494}  PARTICIPATION LIMITATIONS:  {participationrestrictions:25113}  PERSONAL FACTORS: {Personal factors:25162} are also affecting patient's functional outcome.   REHAB POTENTIAL: {rehabpotential:25112}  CLINICAL DECISION MAKING: {clinical decision making:25114}  EVALUATION COMPLEXITY: {Evaluation complexity:25115}   GOALS: Goals reviewed with patient? {yes/no:20286}  SHORT TERM GOALS: Target date: ***  *** Baseline: Goal status: INITIAL  2.  *** Baseline:  Goal status: INITIAL  3.  *** Baseline:  Goal status: INITIAL  4.  *** Baseline:  Goal status: INITIAL  5.  *** Baseline:  Goal status: INITIAL  6.  *** Baseline:  Goal status: INITIAL  LONG TERM GOALS: Target date: ***  *** Baseline:  Goal status: INITIAL  2.  *** Baseline:  Goal status: INITIAL  3.  *** Baseline:  Goal status: INITIAL  4.  *** Baseline:  Goal status: INITIAL  5.  *** Baseline:  Goal status: INITIAL  6.  *** Baseline:  Goal status: INITIAL   PLAN:  PT FREQUENCY: {rehab frequency:25116}  PT DURATION: {rehab duration:25117}  PLANNED INTERVENTIONS: {rehab planned interventions:25118::"97110-Therapeutic exercises","97530- Therapeutic [phone]- Neuromuscular re-education","97535- Self UXNA","35573- Manual therapy"}.  PLAN FOR NEXT SESSION: ***   Kerianna Rawlinson C. Cardarius Senat PT, DPT 12/27/23 2:44 PM

## 2024-01-14 NOTE — Progress Notes (Signed)
 Surgical Instructions   Your procedure is scheduled on Monday, March 3rd, 2025. Report to [facility] Main Entrance "A" at 10:15 A.M., then check in with the Admitting office. Any questions or running late day of surgery: call [phone]  Questions prior to your surgery date: call [phone], Monday-Friday, 8am-4pm. If you experience any cold or flu symptoms such as cough, fever, chills, shortness of breath, etc. between now and your scheduled surgery, please notify us at the above number.     Remember:  Do not eat after midnight the night before your surgery  You may drink clear liquids until 9:15 the morning of your surgery.   Clear liquids allowed are: Water, Non-Citrus Juices (without pulp), Carbonated Beverages, Clear Tea (no milk, honey, etc.), Black Coffee Only (NO MILK, CREAM OR POWDERED CREAMER of any kind), and Gatorade.  Patient Instructions  The night before surgery:  No food after midnight. ONLY clear liquids after midnight  The day of surgery (if you do NOT have diabetes):  Drink ONE (1) Pre-Surgery Clear Ensure by 9:15 the morning of surgery. Drink in one sitting. Do not sip.  This drink was given to you during your hospital  pre-op appointment visit.  Nothing else to drink after completing the  Pre-Surgery Clear Ensure.         If you have questions, please contact your surgeon's office.     Take these medicines the morning of surgery with A SIP OF WATER: Atorvastatin (Lipitor) Buproprion (Wellbutrin) Cetirizine (Zyrtec) Clonazepam (Klonopin) Escitalopram (Lexapro) Estradiol (Estrace) Famotidine (Pepcid) Fluticasone-Umeclidin-Vilant Inhaler Gabapentin (Neurontin) Levothyroxine (Synthroid) Linaclotide (Linzess)   May take these medicines IF NEEDED: Albuterol Inhaler - please bring with you on the day of surgery Albuterol Nebulizer Fluticasone (Flonase) Hydromorphone (Dilaudid) Eye Drops Naratriptan (Amerge) Narcan Valacyclovir    One week  prior to surgery, STOP taking any Aspirin (unless otherwise instructed by your surgeon) Aleve, Naproxen, Ibuprofen, Motrin, Advil, Goody's, BC's, all herbal medications, fish oil, and non-prescription vitamins.                     Do NOT Smoke (Tobacco/Vaping) for 24 hours prior to your procedure.  If you use a CPAP at night, you may bring your mask/headgear for your overnight stay.   You will be asked to remove any contacts, glasses, piercing's, hearing aid's, dentures/partials prior to surgery. Please bring cases for these items if needed.    Patients discharged the day of surgery will not be allowed to drive home, and someone needs to stay with them for 24 hours.  SURGICAL WAITING ROOM VISITATION Patients may have no more than 2 support people in the waiting area - these visitors may rotate.   Pre-op nurse will coordinate an appropriate time for 1 ADULT support person, who may not rotate, to accompany patient in pre-op.  Children under the age of 25 must have an adult with them who is not the patient and must remain in the main waiting area with an adult.  If the patient needs to stay at the hospital during part of their recovery, the visitor guidelines for inpatient rooms apply.  Please refer to the [facility] website for the visitor guidelines for any additional information.   If you received a COVID test during your pre-op visit  it is requested that you wear a mask when out in public, stay away from anyone that may not be feeling well and notify your surgeon if you develop symptoms. If you have been in  contact with anyone that has tested positive in the last 10 days please notify you surgeon.      Pre-operative 5 CHG Bathing Instructions   You can play a key role in reducing the risk of infection after surgery. Your skin needs to be as free of germs as possible. You can reduce the number of germs on your skin by washing with CHG (chlorhexidine gluconate) soap before surgery. CHG  is an antiseptic soap that kills germs and continues to kill germs even after washing.   DO NOT use if you have an allergy to chlorhexidine/CHG or antibacterial soaps. If your skin becomes reddened or irritated, stop using the CHG and notify one of our RNs at [phone].   Please shower with the CHG soap starting 4 days before surgery using the following schedule:     Please keep in mind the following:  DO NOT shave, including legs and underarms, starting the day of your first shower.   You may shave your face at any point before/day of surgery.  Place clean sheets on your bed the day you start using CHG soap. Use a clean washcloth (not used since being washed) for each shower. DO NOT sleep with pets once you start using the CHG.   CHG Shower Instructions:  Wash your face and private area with normal soap. If you choose to wash your hair, wash first with your normal shampoo.  After you use shampoo/soap, rinse your hair and body thoroughly to remove shampoo/soap residue.  Turn the water OFF and apply about 3 tablespoons (45 ml) of CHG soap to a CLEAN washcloth.  Apply CHG soap ONLY FROM YOUR NECK DOWN TO YOUR TOES (washing for 3-5 minutes)  DO NOT use CHG soap on face, private areas, open wounds, or sores.  Pay special attention to the area where your surgery is being performed.  If you are having back surgery, having someone wash your back for you may be helpful. Wait 2 minutes after CHG soap is applied, then you may rinse off the CHG soap.  Pat dry with a clean towel  Put on clean clothes/pajamas   If you choose to wear lotion, please use ONLY the CHG-compatible lotions that are listed below.  Additional instructions for the day of surgery: DO NOT APPLY any lotions, deodorants, cologne, or perfumes.   Do not bring valuables to the hospital. [facility] is not responsible for any belongings/valuables. Do not wear nail polish, gel polish, artificial nails, or any other type of  covering on natural nails (fingers and toes) Do not wear jewelry or makeup Put on clean/comfortable clothes.  Please brush your teeth.  Ask your nurse before applying any prescription medications to the skin.     CHG Compatible Lotions   Aveeno Moisturizing lotion  Cetaphil Moisturizing Cream  Cetaphil Moisturizing Lotion  Clairol Herbal Essence Moisturizing Lotion, Dry Skin  Clairol Herbal Essence Moisturizing Lotion, Extra Dry Skin  Clairol Herbal Essence Moisturizing Lotion, Normal Skin  Curel Age Defying Therapeutic Moisturizing Lotion with Alpha Hydroxy  Curel Extreme Care Body Lotion  Curel Soothing Hands Moisturizing Hand Lotion  Curel Therapeutic Moisturizing Cream, Fragrance-Free  Curel Therapeutic Moisturizing Lotion, Fragrance-Free  Curel Therapeutic Moisturizing Lotion, Original Formula  Eucerin Daily Replenishing Lotion  Eucerin Dry Skin Therapy Plus Alpha Hydroxy Crme  Eucerin Dry Skin Therapy Plus Alpha Hydroxy Lotion  Eucerin Original Crme  Eucerin Original Lotion  Eucerin Plus Crme Eucerin Plus Lotion  Eucerin TriLipid Replenishing Lotion  Keri Anti-Bacterial Hand  Lotion  Keri Deep Conditioning Original Lotion Dry Skin Formula Softly Scented  Keri Deep Conditioning Original Lotion, Fragrance Free Sensitive Skin Formula  Keri Lotion Fast Absorbing Fragrance Free Sensitive Skin Formula  Keri Lotion Fast Absorbing Softly Scented Dry Skin Formula  Keri Original Lotion  Keri Skin Renewal Lotion Keri Silky Smooth Lotion  Keri Silky Smooth Sensitive Skin Lotion  Nivea Body Creamy Conditioning Oil  Nivea Body Extra Enriched Lotion  Nivea Body Original Lotion  Nivea Body Sheer Moisturizing Lotion Nivea Crme  Nivea Skin Firming Lotion  NutraDerm 30 Skin Lotion  NutraDerm Skin Lotion  NutraDerm Therapeutic Skin Cream  NutraDerm Therapeutic Skin Lotion  ProShield Protective Hand Cream  Provon moisturizing lotion  Please read over the following fact sheets  that you were given.    [place]- Preparing for Total Shoulder Arthroplasty  Before surgery, you can play an important role. Because skin is not sterile, your skin needs to be as free of germs as possible. You can reduce the number of germs on your skin by using the following products.   Benzoyl Peroxide Gel  o Reduces the number of germs present on the skin  o Applied twice a day to shoulder area starting two days before surgery   Chlorhexidine Gluconate (CHG) Soap (instructions listed above on how to wash with CHG Soap)  o An antiseptic cleaner that kills germs and bonds with the skin to continue killing germs even after washing  o Used for showering the night before surgery and morning of surgery   ==================================================================  Please follow these instructions carefully:  BENZOYL PEROXIDE 5% GEL  Please do not use if you have an allergy to benzoyl peroxide. If your skin becomes reddened/irritated stop using the benzoyl peroxide.  Starting two days before surgery, apply as follows:  1. Apply benzoyl peroxide in the morning and at night. Apply after taking a shower. If you are not taking a shower clean entire shoulder front, back, and side along with the armpit with a clean wet washcloth.  2. Place a quarter-sized dollop on your SHOULDER and rub in thoroughly, making sure to cover the front, back, and side of your shoulder, along with the armpit.   2 Days prior to Surgery First Dose on _____________ Morning Second Dose on ______________ Night  Day Before Surgery First Dose on ______________ Morning Night before surgery wash (entire body except face and private areas) with CHG Soap THEN Second Dose on ____________ Night   Morning of Surgery  wash BODY AGAIN with CHG Soap   4. Do NOT apply benzoyl peroxide gel on the day of surgery      Please read over the following fact sheets that you were given.

## 2024-01-17 ENCOUNTER — Encounter (HOSPITAL_COMMUNITY)
Admission: RE | Admit: 2024-01-17 | Discharge: 2024-01-17 | Disposition: A | Discharge status: Home / Self Care | Payer: Medicare Other | Source: Ambulatory Visit | Attending: Orthopaedic Surgery | Admitting: Orthopaedic Surgery

## 2024-01-17 ENCOUNTER — Other Ambulatory Visit: Payer: Self-pay

## 2024-01-17 ENCOUNTER — Encounter (HOSPITAL_COMMUNITY): Payer: Self-pay

## 2024-01-17 VITALS — BP 120/84 | HR 97 | Temp 98.0°F | Resp 18 | Ht 62.5 in | Wt 105.0 lb

## 2024-01-17 DIAGNOSIS — E039 Hypothyroidism, unspecified: Secondary | ICD-10-CM | POA: Insufficient documentation

## 2024-01-17 DIAGNOSIS — J4489 Other specified chronic obstructive pulmonary disease: Secondary | ICD-10-CM | POA: Insufficient documentation

## 2024-01-17 DIAGNOSIS — I7300 Raynaud's syndrome without gangrene: Secondary | ICD-10-CM | POA: Diagnosis not present

## 2024-01-17 DIAGNOSIS — Z87891 Personal history of nicotine dependence: Secondary | ICD-10-CM | POA: Insufficient documentation

## 2024-01-17 DIAGNOSIS — Z01812 Encounter for preprocedural laboratory examination: Secondary | ICD-10-CM | POA: Diagnosis present

## 2024-01-17 DIAGNOSIS — F419 Anxiety disorder, unspecified: Secondary | ICD-10-CM | POA: Diagnosis not present

## 2024-01-17 DIAGNOSIS — Z9981 Dependence on supplemental oxygen: Secondary | ICD-10-CM | POA: Diagnosis not present

## 2024-01-17 DIAGNOSIS — Z01818 Encounter for other preprocedural examination: Secondary | ICD-10-CM

## 2024-01-17 DIAGNOSIS — K219 Gastro-esophageal reflux disease without esophagitis: Secondary | ICD-10-CM | POA: Insufficient documentation

## 2024-01-17 DIAGNOSIS — M797 Fibromyalgia: Secondary | ICD-10-CM | POA: Diagnosis not present

## 2024-01-17 LAB — CBC
HCT: 45.3 % (ref 36.0–46.0)
Hemoglobin: 14.2 g/dL (ref 12.0–15.0)
MCH: 29.4 pg (ref 26.0–34.0)
MCHC: 31.3 g/dL (ref 30.0–36.0)
MCV: 93.8 fL (ref 80.0–100.0)
Platelets: 239 10*3/uL (ref 150–400)
RBC: 4.83 MIL/uL (ref 3.87–5.11)
RDW: 14.3 % (ref 11.5–15.5)
WBC: 12.2 10*3/uL — ABNORMAL HIGH (ref 4.0–10.5)
nRBC: 0 % (ref 0.0–0.2)

## 2024-01-17 LAB — BASIC METABOLIC PANEL
Anion gap: 7 (ref 5–15)
BUN: 12 mg/dL (ref 8–23)
CO2: 29 mmol/L (ref 22–32)
Calcium: 8.3 mg/dL — ABNORMAL LOW (ref 8.9–10.3)
Chloride: 103 mmol/L (ref 98–111)
Creatinine, Ser: 0.83 mg/dL (ref 0.44–1.00)
GFR, Estimated: >60 mL/min (ref >60)
Glucose, Bld: 95 mg/dL (ref 70–99)
Potassium: 3.7 mmol/L (ref 3.5–5.1)
Sodium: 139 mmol/L (ref 135–145)

## 2024-01-17 LAB — SURGICAL PCR SCREEN
MRSA, PCR: NEGATIVE
Staphylococcus aureus: NEGATIVE

## 2024-01-17 NOTE — Progress Notes (Signed)
 PCP - Dr. Ardean Larsen Cardiologist - denies  Pulmonologist - Dr. Leona Carry - [facility] - requested LOV  PPM/ICD - denies  Chest x-ray - denies EKG - 06/28/23 Stress Test - denies ECHO - denies Cardiac Cath - denies  Sleep Study - denies  No DM  Last dose of GLP1 agonist-  n/a GLP1 instructions: n/a  Blood Thinner Instructions: n/a Aspirin Instructions: patient states that she is not taking Aspirin  ERAS Protcol - clears until 0915 - thoroughly reviewed clear liquids with patient - explained to patient that dairy was not considered a clear liquid and it would cancel her surgery PRE-SURGERY Ensure or G2- Ensure as ordered  COVID TEST- n/a   Anesthesia review: yes - patient states that she wears oxygen at home at night while sleeping;   Patient denies shortness of breath, fever, cough and chest pain at PAT appointment   All instructions explained to the patient, with a verbal understanding of the material. Patient agrees to go over the instructions while at home for a better understanding. Patient also instructed to self quarantine after being tested for COVID-19. The opportunity to ask questions was provided.

## 2024-01-18 NOTE — Anesthesia Preprocedure Evaluation (Addendum)
 Anesthesia Evaluation  Patient identified by MRN, date of birth, ID band Patient awake    Reviewed: Allergy & Precautions, NPO status , Patient's Chart, lab work & pertinent test results  History of Anesthesia Complications Negative for: history of anesthetic complications  Airway Mallampati: III  TM Distance: >3 FB Neck ROM: Full    Dental  (+) Edentulous Upper, Edentulous Lower, Dental Advisory Given   Pulmonary neg shortness of breath, asthma , COPD (3 LPM as needed, denies continuous use when asked),  oxygen dependent, neg recent URI, former smoker   Pulmonary exam normal breath sounds clear to auscultation       Cardiovascular (-) hypertension(-) angina + DVT (1972)  (-) Past MI, (-) Cardiac Stents and (-) CABG (-) dysrhythmias  Rhythm:Regular Rate:Normal  HLD   Neuro/Psych  Headaches, neg Seizures PSYCHIATRIC DISORDERS Anxiety Depression    Chronic pain (fentanyl patch 25 mcg/h, hydromorphone))    GI/Hepatic ,GERD  ,,(+)     substance abuse  marijuana useH/o alcohol-induced pancreatitis   Endo/Other  neg diabetesHypothyroidism  H/o thyroid cancer  Renal/GU negative Renal ROS     Musculoskeletal  (+) Arthritis , Osteoarthritis,  Fibromyalgia -Scoliosis    Abdominal   Peds  Hematology negative hematology ROS (+) Lab Results      Component                Value               Date                      WBC                      12.2 (H)            01/17/2024                HGB                      14.2                01/17/2024                HCT                      45.3                01/17/2024                MCV                      93.8                01/17/2024                PLT                      239                 01/17/2024              Anesthesia Other Findings   Reproductive/Obstetrics                             Anesthesia Physical Anesthesia Plan  ASA:  4  Anesthesia Plan: General   Post-op Pain Management: Regional block* and Tylenol PO (pre-op)*  Induction: Intravenous  PONV Risk Score and Plan: 3 and Ondansetron, Dexamethasone and Treatment may vary due to age or medical condition  Airway Management Planned: Oral ETT  Additional Equipment:   Intra-op Plan:   Post-operative Plan: Extubation in OR  Informed Consent: I have reviewed the patients History and Physical, chart, labs and discussed the procedure including the risks, benefits and alternatives for the proposed anesthesia with the patient or authorized representative who has indicated his/her understanding and acceptance.     Dental advisory given  Plan Discussed with: CRNA and Anesthesiologist  Anesthesia Plan Comments: (Patient has fentanyl patch 25 mcg/h on her LLQ. She took hydromorphone 2 mg this morning around 9:00 am. She rates her current pain 6/10. She is not currently wearing oxygen and has an SpO2 of 97% on RA. She denies current SOB. Discussed with patient phrenic nerve blockade with interscalene block with possible SOB afterward requiring an increased oxygen requirement given her pre-existing lung disease. Patient may require overnight stay if this were to occur. However, given patient's chronic pain and opioid dependence, I believe the benefits of the nerve block outweigh the risks. The patient is in agreement and consents to the nerve block.  Discussed potential risks of nerve blocks including, but not limited to, infection, bleeding, nerve damage, seizures, pneumothorax, respiratory depression, and potential failure of the block. Alternatives to nerve blocks discussed. All questions answered.  Risks of general anesthesia discussed including, but not limited to, sore throat, hoarse voice, chipped/damaged teeth, injury to vocal cords, nausea and vomiting, allergic reactions, lung infection, heart attack, stroke, and death. All questions answered.   PAT note by  Antionette Poles, PA-C: 73 year old female with pertinent to including former smoker (greater than 100-pack-year history, quit 2022) with associated oxygen dependent COPD, asthma, GERD, hypothyroid, anxiety, fibromyalgia, Raynaud's.  History of oxygen dependent COPD, uses 3 L supplemental O2 continuous.  She is followed by pulmonologist Dr. Damien Fusi at [facility] in [place].  She was seen 05/20/2023 for preop evaluation.  Per note, "ARISCAT Score: 43 points, intermediate risk 13.3% for post operative pulmonary complications Chales Abrahams Post operative Respiratory Failure Score: 1% risk for postoperative mechanical ventilation > 48 hours post operatively Surgical Hx: - L knee arthroplasty 2020 - had GA and did fine; she was on oxygen at the time Medications: - Aspirin 325 mg daily Oxygen: 3 L oxygen 24/7 Exacerbation/Pneumonia: 3 times this year."  Preop labs reviewed, unremarkable.  EKG 06/28/2023: NSR.  Rate 71.  Coronary calcium scan 01/08/2022: FINDINGS: Non-cardiac: See separate report from [facility].  Ascending Aorta: Normal caliber.  Aortic atherosclerosis.  Pericardium: Normal.  Coronary arteries: Normal origins.  Coronary Calcium Score:  Left main: 0  Left anterior descending artery: 0  Left circumflex artery: 0  Right coronary artery: 0  Total: 0  Percentile: 1st for age, sex, and race matched control.  IMPRESSION: 1. Coronary calcium score of 0. This was 1st percentile for age, gender, and race matched controls.   )        Anesthesia Quick Evaluation

## 2024-01-18 NOTE — Progress Notes (Signed)
 Anesthesia Chart Review:  73 year old female with pertinent to including former smoker (greater than 100-pack-year history, quit 2022) with associated oxygen dependent COPD, asthma, GERD, hypothyroid, anxiety, fibromyalgia, Raynaud's.   History of oxygen dependent COPD, uses 3 L supplemental O2 continuous.  She is followed by pulmonologist Dr. Damien Fusi at [facility] in [place].  She was seen 05/20/2023 for preop evaluation.  Per note, "ARISCAT Score: 43 points, intermediate risk 13.3% for post operative pulmonary complications Chales Abrahams Post operative Respiratory Failure Score: 1% risk for postoperative mechanical ventilation > 48 hours post operatively Surgical Hx: - L knee arthroplasty 2020 - had GA and did fine; she was on oxygen at the time Medications: - Aspirin 325 mg daily Oxygen: 3 L oxygen 24/7 Exacerbation/Pneumonia: 3 times this year."   Preop labs reviewed, unremarkable.   EKG 06/28/2023: NSR.  Rate 71.   Coronary calcium scan 01/08/2022: FINDINGS: Non-cardiac: See separate report from [facility].   Ascending Aorta: Normal caliber.   Aortic atherosclerosis.   Pericardium: Normal.   Coronary arteries: Normal origins.   Coronary Calcium Score:   Left main: 0   Left anterior descending artery: 0   Left circumflex artery: 0   Right coronary artery: 0   Total: 0   Percentile: 1st for age, sex, and race matched control.   IMPRESSION: 1. Coronary calcium score of 0. This was 1st percentile for age, gender, and race matched controls.    Zannie Cove [facility] Short Stay [facility]/Anesthesiology Phone [phone] 01/18/2024 3:36 PM

## 2024-01-24 ENCOUNTER — Other Ambulatory Visit: Payer: Self-pay

## 2024-01-24 ENCOUNTER — Ambulatory Visit (HOSPITAL_COMMUNITY): Payer: Self-pay | Admitting: Anesthesiology

## 2024-01-24 ENCOUNTER — Ambulatory Visit (HOSPITAL_COMMUNITY)
Admission: RE | Admit: 2024-01-24 | Discharge: 2024-01-24 | Disposition: A | Discharge status: Home / Self Care | Payer: Medicare Other | Attending: Orthopaedic Surgery | Admitting: Orthopaedic Surgery

## 2024-01-24 ENCOUNTER — Encounter (HOSPITAL_COMMUNITY)
Admission: RE | Disposition: A | Discharge status: Home / Self Care | Payer: Self-pay | Source: Home / Self Care | Attending: Orthopaedic Surgery

## 2024-01-24 ENCOUNTER — Ambulatory Visit (HOSPITAL_COMMUNITY)

## 2024-01-24 ENCOUNTER — Ambulatory Visit (HOSPITAL_BASED_OUTPATIENT_CLINIC_OR_DEPARTMENT_OTHER): Payer: Self-pay | Admitting: Orthopaedic Surgery

## 2024-01-24 ENCOUNTER — Telehealth: Payer: Self-pay | Admitting: Orthopaedic Surgery

## 2024-01-24 ENCOUNTER — Ambulatory Visit (HOSPITAL_COMMUNITY): Payer: Self-pay | Admitting: Physician Assistant

## 2024-01-24 DIAGNOSIS — M19012 Primary osteoarthritis, left shoulder: Secondary | ICD-10-CM | POA: Diagnosis not present

## 2024-01-24 DIAGNOSIS — F32A Depression, unspecified: Secondary | ICD-10-CM | POA: Insufficient documentation

## 2024-01-24 DIAGNOSIS — E890 Postprocedural hypothyroidism: Secondary | ICD-10-CM | POA: Insufficient documentation

## 2024-01-24 DIAGNOSIS — G8929 Other chronic pain: Secondary | ICD-10-CM | POA: Diagnosis not present

## 2024-01-24 DIAGNOSIS — F419 Anxiety disorder, unspecified: Secondary | ICD-10-CM | POA: Insufficient documentation

## 2024-01-24 DIAGNOSIS — Z87891 Personal history of nicotine dependence: Secondary | ICD-10-CM | POA: Insufficient documentation

## 2024-01-24 DIAGNOSIS — Z79891 Long term (current) use of opiate analgesic: Secondary | ICD-10-CM | POA: Diagnosis not present

## 2024-01-24 DIAGNOSIS — Z85850 Personal history of malignant neoplasm of thyroid: Secondary | ICD-10-CM | POA: Insufficient documentation

## 2024-01-24 DIAGNOSIS — K219 Gastro-esophageal reflux disease without esophagitis: Secondary | ICD-10-CM | POA: Insufficient documentation

## 2024-01-24 DIAGNOSIS — J4489 Other specified chronic obstructive pulmonary disease: Secondary | ICD-10-CM | POA: Diagnosis not present

## 2024-01-24 DIAGNOSIS — M797 Fibromyalgia: Secondary | ICD-10-CM | POA: Diagnosis not present

## 2024-01-24 DIAGNOSIS — Z86718 Personal history of other venous thrombosis and embolism: Secondary | ICD-10-CM | POA: Insufficient documentation

## 2024-01-24 DIAGNOSIS — E785 Hyperlipidemia, unspecified: Secondary | ICD-10-CM | POA: Diagnosis not present

## 2024-01-24 HISTORY — PX: REVERSE SHOULDER ARTHROPLASTY: SHX5054

## 2024-01-24 SURGERY — ARTHROPLASTY, SHOULDER, TOTAL, REVERSE
Anesthesia: General | Site: Shoulder | Laterality: Left

## 2024-01-24 MED ORDER — PHENYLEPHRINE 80 MCG/ML (10ML) SYRINGE FOR IV PUSH (FOR BLOOD PRESSURE SUPPORT)
PREFILLED_SYRINGE | INTRAVENOUS | Status: AC
  Filled 2024-01-24: qty 10

## 2024-01-24 MED ORDER — CEFAZOLIN SODIUM-DEXTROSE 2-4 GM/100ML-% IV SOLN
2.0000 g | INTRAVENOUS | Status: AC
  Administered 2024-01-24: 2 g via INTRAVENOUS
  Filled 2024-01-24: qty 100

## 2024-01-24 MED ORDER — 0.9 % SODIUM CHLORIDE (POUR BTL) OPTIME
TOPICAL | Status: DC | PRN
  Administered 2024-01-24: 1000 mL

## 2024-01-24 MED ORDER — ACETAMINOPHEN 500 MG PO TABS
1000.0000 mg | ORAL_TABLET | Freq: Once | ORAL | Status: DC
  Filled 2024-01-24: qty 2

## 2024-01-24 MED ORDER — GABAPENTIN 300 MG PO CAPS
300.0000 mg | ORAL_CAPSULE | Freq: Once | ORAL | Status: DC
Start: 2024-01-24 — End: 2024-01-26
  Filled 2024-01-24: qty 1

## 2024-01-24 MED ORDER — MIDAZOLAM HCL 2 MG/2ML IJ SOLN
INTRAMUSCULAR | Status: DC | PRN
  Administered 2024-01-24: 2 mg via INTRAVENOUS

## 2024-01-24 MED ORDER — ROCURONIUM BROMIDE 10 MG/ML (PF) SYRINGE
PREFILLED_SYRINGE | INTRAVENOUS | Status: AC
  Filled 2024-01-24: qty 10

## 2024-01-24 MED ORDER — TRANEXAMIC ACID-NACL 1000-0.7 MG/100ML-% IV SOLN
1000.0000 mg | INTRAVENOUS | Status: AC
Start: 2024-01-24 — End: 2024-01-24
  Administered 2024-01-24: 1000 mg via INTRAVENOUS
  Filled 2024-01-24: qty 100

## 2024-01-24 MED ORDER — VANCOMYCIN HCL 1000 MG IV SOLR
INTRAVENOUS | Status: DC | PRN
  Administered 2024-01-24: 1000 mg via TOPICAL

## 2024-01-24 MED ORDER — ONDANSETRON HCL 4 MG/2ML IJ SOLN
INTRAMUSCULAR | Status: AC
  Filled 2024-01-24: qty 2

## 2024-01-24 MED ORDER — FENTANYL CITRATE (PF) 250 MCG/5ML IJ SOLN
INTRAMUSCULAR | Status: AC
  Filled 2024-01-24: qty 5

## 2024-01-24 MED ORDER — SUGAMMADEX SODIUM 200 MG/2ML IV SOLN
INTRAVENOUS | Status: AC
  Filled 2024-01-24: qty 2

## 2024-01-24 MED ORDER — ROCURONIUM BROMIDE 10 MG/ML (PF) SYRINGE
PREFILLED_SYRINGE | INTRAVENOUS | Status: DC | PRN
  Administered 2024-01-24 (×2): 30 mg via INTRAVENOUS

## 2024-01-24 MED ORDER — SODIUM CHLORIDE 0.9 % IR SOLN
Status: DC | PRN
  Administered 2024-01-24: 1000 mL

## 2024-01-24 MED ORDER — ONDANSETRON HCL 4 MG/2ML IJ SOLN
INTRAMUSCULAR | Status: DC | PRN
Start: 2024-01-24 — End: 2024-01-24
  Administered 2024-01-24: 4 mg via INTRAVENOUS

## 2024-01-24 MED ORDER — AMISULPRIDE (ANTIEMETIC) 5 MG/2ML IV SOLN: 10.0000 mg | Freq: Once | INTRAVENOUS | Status: DC | PRN

## 2024-01-24 MED ORDER — HYDROMORPHONE HCL 4 MG PO TABS: 4.0000 mg | ORAL_TABLET | ORAL | 0 refills | Status: DC | PRN

## 2024-01-24 MED ORDER — PHENYLEPHRINE HCL-NACL 20-0.9 MG/250ML-% IV SOLN
INTRAVENOUS | Status: DC | PRN
  Administered 2024-01-24: 80 ug/min via INTRAVENOUS

## 2024-01-24 MED ORDER — SUGAMMADEX SODIUM 200 MG/2ML IV SOLN
INTRAVENOUS | Status: DC | PRN
Start: 2024-01-24 — End: 2024-01-24
  Administered 2024-01-24: 200 mg via INTRAVENOUS

## 2024-01-24 MED ORDER — LIDOCAINE 2% (20 MG/ML) 5 ML SYRINGE
INTRAMUSCULAR | Status: AC
  Filled 2024-01-24: qty 5

## 2024-01-24 MED ORDER — IRRISEPT - 450ML BOTTLE WITH 0.05% CHG IN STERILE WATER, USP 99.95% OPTIME
TOPICAL | Status: DC | PRN
  Administered 2024-01-24: 450 mL

## 2024-01-24 MED ORDER — FENTANYL CITRATE (PF) 100 MCG/2ML IJ SOLN
100.0000 ug | Freq: Once | INTRAMUSCULAR | Status: AC
  Filled 2024-01-24: qty 2

## 2024-01-24 MED ORDER — LACTATED RINGERS IV SOLN: INTRAVENOUS | Status: DC

## 2024-01-24 MED ORDER — DEXAMETHASONE SODIUM PHOSPHATE 10 MG/ML IJ SOLN
INTRAMUSCULAR | Status: AC
  Filled 2024-01-24: qty 1

## 2024-01-24 MED ORDER — MIDAZOLAM HCL 2 MG/2ML IJ SOLN
INTRAMUSCULAR | Status: AC
  Filled 2024-01-24: qty 2

## 2024-01-24 MED ORDER — DEXAMETHASONE SODIUM PHOSPHATE 10 MG/ML IJ SOLN
INTRAMUSCULAR | Status: DC | PRN
  Administered 2024-01-24: 10 mg via INTRAVENOUS

## 2024-01-24 MED ORDER — ORAL CARE MOUTH RINSE: 15.0000 mL | Freq: Once | OROMUCOSAL | Status: AC

## 2024-01-24 MED ORDER — LIDOCAINE 2% (20 MG/ML) 5 ML SYRINGE
INTRAMUSCULAR | Status: DC | PRN
  Administered 2024-01-24: 40 mg via INTRAVENOUS

## 2024-01-24 MED ORDER — BUPIVACAINE LIPOSOME 1.3 % IJ SUSP
INTRAMUSCULAR | Status: DC | PRN
  Administered 2024-01-24: 10 mL via PERINEURAL

## 2024-01-24 MED ORDER — PROPOFOL 10 MG/ML IV BOLUS
INTRAVENOUS | Status: DC | PRN
  Administered 2024-01-24: 90 mg via INTRAVENOUS

## 2024-01-24 MED ORDER — FENTANYL CITRATE (PF) 100 MCG/2ML IJ SOLN
INTRAMUSCULAR | Status: AC
Start: 2024-01-24 — End: 2024-01-24
  Administered 2024-01-24: 100 ug via INTRAVENOUS
  Filled 2024-01-24: qty 2

## 2024-01-24 MED ORDER — PROPOFOL 10 MG/ML IV BOLUS
INTRAVENOUS | Status: AC
  Filled 2024-01-24: qty 20

## 2024-01-24 MED ORDER — FENTANYL CITRATE (PF) 250 MCG/5ML IJ SOLN
INTRAMUSCULAR | Status: DC | PRN
  Administered 2024-01-24: 50 ug via INTRAVENOUS

## 2024-01-24 MED ORDER — PHENYLEPHRINE 80 MCG/ML (10ML) SYRINGE FOR IV PUSH (FOR BLOOD PRESSURE SUPPORT)
PREFILLED_SYRINGE | INTRAVENOUS | Status: DC | PRN
Start: 2024-01-24 — End: 2024-01-24
  Administered 2024-01-24: 240 ug via INTRAVENOUS
  Administered 2024-01-24 (×2): 160 ug via INTRAVENOUS
  Administered 2024-01-24: 240 ug via INTRAVENOUS

## 2024-01-24 MED ORDER — VANCOMYCIN HCL 1000 MG IV SOLR
INTRAVENOUS | Status: AC
  Filled 2024-01-24: qty 20

## 2024-01-24 MED ORDER — OXYCODONE HCL 5 MG/5ML PO SOLN: 5.0000 mg | Freq: Once | ORAL | Status: DC | PRN

## 2024-01-24 MED ORDER — POVIDONE-IODINE 10 % EX SOLN
CUTANEOUS | Status: DC | PRN
  Administered 2024-01-24: 1 via TOPICAL

## 2024-01-24 MED ORDER — OXYCODONE HCL 5 MG PO TABS: 5.0000 mg | ORAL_TABLET | Freq: Once | ORAL | Status: DC | PRN

## 2024-01-24 MED ORDER — FENTANYL CITRATE (PF) 100 MCG/2ML IJ SOLN: 25.0000 ug | INTRAMUSCULAR | Status: DC | PRN

## 2024-01-24 MED ORDER — CHLORHEXIDINE GLUCONATE 0.12 % MT SOLN
15.0000 mL | Freq: Once | OROMUCOSAL | Status: AC
  Administered 2024-01-24: 15 mL via OROMUCOSAL
  Filled 2024-01-24: qty 15

## 2024-01-24 MED ORDER — BUPIVACAINE HCL (PF) 0.25 % IJ SOLN
INTRAMUSCULAR | Status: DC | PRN
  Administered 2024-01-24: 10 mL via EPIDURAL

## 2024-01-24 SURGICAL SUPPLY — 64 items
ANCHOR JUGGERKNOT SOFT 1.5 (Anchor) ×1 IMPLANT
ANCHOR JUGGERKNOT SOFT 2.9 (Anchor) IMPLANT
AUG BASEPLATE 15DEG 25 WEDGE (Joint) ×1 IMPLANT
AUGMENT BASEPLATE 15DEG 25 WDG (Joint) IMPLANT
BAG COUNTER SPONGE SURGICOUNT (BAG) ×2 IMPLANT
BIT DRILL 3.2 PERIPHERAL SCREW (BIT) IMPLANT
BLADE SAW SAG 29X58X.64 (BLADE) IMPLANT
CHLORAPREP W/TINT 26 (MISCELLANEOUS) ×2 IMPLANT
COOLER ICEMAN CLASSIC (MISCELLANEOUS) ×2 IMPLANT
COVER SURGICAL LIGHT HANDLE (MISCELLANEOUS) ×2 IMPLANT
DRAPE IMP U-DRAPE 54X76 (DRAPES) ×2 IMPLANT
DRAPE INCISE IOBAN 66X45 STRL (DRAPES) ×2 IMPLANT
DRAPE U-SHAPE 47X51 STRL (DRAPES) ×4 IMPLANT
DRSG AQUACEL AG ADV 3.5X10 (GAUZE/BANDAGES/DRESSINGS) ×2 IMPLANT
ELECT BLADE 4.0 EZ CLEAN MEGAD (MISCELLANEOUS) ×1 IMPLANT
ELECT REM PT RETURN 9FT ADLT (ELECTROSURGICAL) ×1 IMPLANT
ELECTRODE BLDE 4.0 EZ CLN MEGD (MISCELLANEOUS) ×2 IMPLANT
ELECTRODE REM PT RTRN 9FT ADLT (ELECTROSURGICAL) ×2 IMPLANT
GAUZE XEROFORM 1X8 LF (GAUZE/BANDAGES/DRESSINGS) IMPLANT
GLENOSPHERE REV SHOULDER 36 (Joint) IMPLANT
GLOVE BIO SURGEON STRL SZ7.5 (GLOVE) ×6 IMPLANT
GLOVE BIOGEL PI IND STRL 6.5 (GLOVE) ×2 IMPLANT
GLOVE BIOGEL PI IND STRL 8 (GLOVE) ×4 IMPLANT
GLOVE ECLIPSE 6.0 STRL STRAW (GLOVE) ×2 IMPLANT
GLOVE INDICATOR 8.0 STRL GRN (GLOVE) ×2 IMPLANT
GOWN STRL REUS W/ TWL LRG LVL3 (GOWN DISPOSABLE) ×4 IMPLANT
GUIDE PIN 3X75 SHOULDER (PIN) ×2 IMPLANT
GUIDEWIRE GLENOID 2.5X220 (WIRE) IMPLANT
INSERT REVERSED HUMERAL SIZE 1 (Orthopedic Implant) IMPLANT
KIT BASIN OR (CUSTOM PROCEDURE TRAY) ×2 IMPLANT
KIT STABILIZATION SHOULDER (MISCELLANEOUS) ×2 IMPLANT
KIT TURNOVER KIT B (KITS) ×2 IMPLANT
MANIFOLD NEPTUNE II (INSTRUMENTS) ×2 IMPLANT
NDL HYPO 21X1 ECLIPSE (NEEDLE) ×2 IMPLANT
NDL MAYO TROCAR (NEEDLE) ×2 IMPLANT
NEEDLE HYPO 21X1 ECLIPSE (NEEDLE) ×1 IMPLANT
NEEDLE MAYO TROCAR (NEEDLE) IMPLANT
NS IRRIG 1000ML POUR BTL (IV SOLUTION) ×2 IMPLANT
PACK SHOULDER (CUSTOM PROCEDURE TRAY) ×2 IMPLANT
PACK UNIVERSAL I (CUSTOM PROCEDURE TRAY) ×2 IMPLANT
PAD ARMBOARD 7.5X6 YLW CONV (MISCELLANEOUS) ×4 IMPLANT
PAD COLD SHLDR WRAP-ON (PAD) ×2 IMPLANT
PIN GUIDE 3X75 SHOULDER (PIN) IMPLANT
RESTRAINT HEAD UNIVERSAL NS (MISCELLANEOUS) ×2 IMPLANT
SCREW 5.0X18 (Screw) IMPLANT
SCREW BONE INTRNL SM 7 (Screw) IMPLANT
SCREW PERIPHERAL 30 (Screw) IMPLANT
SET HNDPC FAN SPRY TIP SCT (DISPOSABLE) ×2 IMPLANT
SLING ARM IMMOBILIZER LRG (SOFTGOODS) ×2 IMPLANT
SPONGE T-LAP 18X18 ~~LOC~~+RFID (SPONGE) ×2 IMPLANT
STAPLER VISISTAT 35W (STAPLE) ×2 IMPLANT
STEM HUMERAL SHORT STD SZ1 (Orthopedic Implant) IMPLANT
SUCTION TUBE FRAZIER 10FR DISP (SUCTIONS) ×2 IMPLANT
SUT ETHILON 3 0 PS 1 (SUTURE) IMPLANT
SUT FIBERWIRE #5 38 CONV NDL (SUTURE) ×2 IMPLANT
SUT VIC AB 0 CT1 27XBRD ANBCTR (SUTURE) ×4 IMPLANT
SUT VIC AB 2-0 CT1 TAPERPNT 27 (SUTURE) ×4 IMPLANT
SUTURE FIBERWR #5 38 CONV NDL (SUTURE) ×4 IMPLANT
SYR 50ML LL SCALE MARK (SYRINGE) ×2 IMPLANT
TAPE LABRALWHITE 1.5X36 (TAPE) IMPLANT
TAPE SUT LABRALTAP WHT/BLK (SUTURE) IMPLANT
TOWEL GREEN STERILE (TOWEL DISPOSABLE) ×2 IMPLANT
TRAY FOLEY MTR SLVR 16FR STAT (SET/KITS/TRAYS/PACK) ×2 IMPLANT
WATER STERILE IRR 1000ML POUR (IV SOLUTION) ×2 IMPLANT

## 2024-01-24 NOTE — Telephone Encounter (Signed)
 Pt stated medication after surgery was sent to the wrong pharmacy pt stated she needs oxycodone be sent to [facility] on [address], [place], [place] [address]

## 2024-01-24 NOTE — H&P (Signed)
 Chief Complaint: Bilateral shoulder pain        History of Present Illness:    12/15/2023: Today predominantly for discussion of the left shoulder as she is still having persistent overhead pain in the joint which is now causing significant trapezial type pain she is hoping to discuss surgery for the left shoulder   [name] is a 73 y.o. female right-hand-dominant female presents with ongoing right shoulder pain that has been painful for the last year and worse over the last 3 months.  She is having pain with any type of overhead activity or range of motion.  She has been having multiple injections with Dr. Magnus Ivan which has given her relief although these are no longer giving her any type of significant relief.  She is on long-term narcotics from issues with her back.  She is scheduled to undergo radiofrequency ablation in July of this year.  She is having a difficult time reaching overhead or doing her hair.  She states that this has been really bothersome lately and she is no longer able to even lay on the side.       Surgical History:   none   PMH/PSH/Family History/Social History/Meds/Allergies:         Past Medical History:  Diagnosis Date   Anxiety      states frequent  anxiety attacks   Asthma     Cancer (HCC)      HX OF THYROID CANCER    Cold no drainage    since thursday 07-21-12, chest xray done 07-24-2012    COPD (chronic obstructive pulmonary disease) (HCC)      LOV with PFT 1/13  Dr Cherie Ouch on chart-   pt states no changes since last saw pulmonary   Depression     Fibromyalgia      states has had trigger point injections shoulders-    GERD (gastroesophageal reflux disease)     Headache(784.0)      migraines   History of DVT of lower extremity 1972    right leg   Hyperlipidemia     Hypothyroidism     OA (osteoarthritis)     On home oxygen therapy      2 liters per minute per [place] as needed   Pneumonia      2008   Seasonal allergies       sgtates clear nasal drainage x 3- days- states no fever   Shortness of breath      oxygen at 2 liters at night, WITH EXERTION             Past Surgical History:  Procedure Laterality Date   ABDOMINAL HYSTERECTOMY   1990    bilateral salpingo-oophorectomy   BARTHOLIN GLAND CYST EXCISION   1977    PRIOR MARSIPULIZATION IN 1974   BOWEL SURGERY FOR OBSTRUCTION   1992   CLOSED MANIPULATION RIGHT KNEE REPLACEMENT   09-10-2010   DIAGNOSTIC LAPAROSCOPY   1989    x2-3 with ovarian cystectomy   FINGER ARTHROPLASTY   07/29/2012    Procedure: FINGER ARTHROPLASTY;  Surgeon: Drucilla Schmidt, MD;  Location: [facility];  Service: Orthopedics;  Laterality: Left;  Left Thumb, CMC Interpositional Arthroplasty Left Thumb,with Palmaris Longis Tendon Graft, IPSI Lateral       HEMORRHOID SURGERY   1988   KNEE ARTHROSCOPY   10-26-2008    AND REMOVAL 2 SCREWS PROXIMAL TIBIA  OF RIGHT KNEE   KNEE ARTHROTOMY   LAST  ONE 2000    right x 3 prior to replacement   LEFT SHOULDER ARTHROSCOPY   03-02-2012   THYROID LOBECTOMY   2009    PARTIAL LEFT LOWER LOBECTOMY   TONSILLECTOMY   1959   TOTAL KNEE ARTHROPLASTY   07-31-2010    OA  RIGHT KNEE   TOTAL KNEE ARTHROPLASTY Left 10/03/2020    Procedure: LEFT TOTAL KNEE ARTHROPLASTY;  Surgeon: Kathryne Hitch, MD;  Location: [facility];  Service: Orthopedics;  Laterality: Left;        Social History         Socioeconomic History   Marital status: Married      Spouse name: Not on file   Number of children: Not on file   Years of education: Not on file   Highest education level: Not on file  Occupational History   Not on file  Tobacco Use   Smoking status: Former      Current packs/day: 0.00      Average packs/day: 1 pack/day for 43.0 years (43.0 ttl pk-yrs)      Types: Cigarettes      Quit date: 09/23/2021      Years since quitting: 2.2   Smokeless tobacco: Never  Vaping Use   Vaping status: Never Used  Substance and Sexual Activity   Alcohol use:  No   Drug use: Yes      Types: Marijuana      Comment: smokes once every 3 weeks   Sexual activity: Not on file  Other Topics Concern   Not on file  Social History Narrative   Not on file    Social Drivers of Health    Financial Resource Strain: Not on file  Food Insecurity: Not on file  Transportation Needs: Not on file  Physical Activity: Not on file  Stress: Not on file  Social Connections: Not on file    No family history on file.     Allergies       Allergies  Allergen Reactions   Codeine Nausea And Vomiting   Doxycycline Nausea And Vomiting      Pt states she can tolerate this   Penicillins Other (See Comments)      Family cannot take / update 03/02/12 - pt. does not remember the reaction.   Sulfa Antibiotics Nausea And Vomiting   Tylenol [Acetaminophen] Other (See Comments)      Cannot take with Neurontin / update 03/02/12 - causes diarrhea   Vistaril [Hydroxyzine Hcl] Other (See Comments)       Hallucinations            Current Outpatient Medications  Medication Sig Dispense Refill   albuterol (PROVENTIL HFA;VENTOLIN HFA) 108 (90 BASE) MCG/ACT inhaler Inhale 2 puffs into the lungs every 6 (six) hours as needed for wheezing or shortness of breath.        albuterol (PROVENTIL) (2.5 MG/3ML) 0.083% nebulizer solution Take 2.5 mg by nebulization every 6 (six) hours as needed for shortness of breath or wheezing.       Ascorbic Acid (VITAMIN C) 1000 MG tablet Take 1,000 mg by mouth 2 (two) times daily.       aspirin 325 MG EC tablet TAKE 1 TABLET BY MOUTH TWICE A DAY AFTER MEAL (Patient taking differently: 325 mg every 6 (six) hours as needed for moderate pain.) 30 tablet 0   atorvastatin (LIPITOR) 20 MG tablet Take 10 mg by mouth daily before breakfast.        B  Complex-C (B-COMPLEX WITH VITAMIN C) tablet Take 1 tablet by mouth daily.       buPROPion (WELLBUTRIN XL) 300 MG 24 hr tablet Take 300 mg by mouth daily.       butalbital-aspirin-caffeine (FIORINAL) 50-325-40  MG capsule Take 1 capsule by mouth 2 (two) times daily as needed for headache.       clonazePAM (KLONOPIN) 0.5 MG tablet Take 0.25-0.5 mg by mouth 3 (three) times daily as needed for anxiety.        escitalopram (LEXAPRO) 10 MG tablet Take 10 mg by mouth daily.       estradiol (ESTRACE) 0.5 MG tablet Take 0.5 mg by mouth daily.       famotidine (PEPCID) 40 MG tablet Take 40 mg by mouth 2 (two) times daily.       fentaNYL (DURAGESIC) 25 MCG/HR Place 1 patch onto the skin every other day.       fexofenadine (ALLEGRA) 180 MG tablet Take 180 mg by mouth daily.       fluticasone (FLONASE) 50 MCG/ACT nasal spray Place 1 spray into both nostrils daily as needed for allergies.        Fluticasone-Umeclidin-Vilant 100-62.5-25 MCG/INH AEPB Inhale 1 puff into the lungs daily.        gabapentin (NEURONTIN) 300 MG capsule Take 300 mg by mouth 2 (two) times daily.       glycopyrrolate (ROBINUL) 1 MG tablet Take 1 mg by mouth at bedtime.       HYDROmorphone (DILAUDID) 2 MG tablet Take 2 mg by mouth 4 (four) times daily as needed for moderate pain.       HYDROmorphone (DILAUDID) 4 MG tablet Take 1 tablet (4 mg total) by mouth every 4 (four) hours as needed for severe pain (if oxycodone not effective). (Patient not taking: Reported on 06/22/2023) 30 tablet 0   hydroxypropyl methylcellulose / hypromellose (ISOPTO TEARS / GONIOVISC) 2.5 % ophthalmic solution Place 1 drop into both eyes as needed for dry eyes.       levothyroxine (SYNTHROID, LEVOTHROID) 25 MCG tablet Take 25 mcg by mouth daily before breakfast.       Multiple Vitamin (MULTIVITAMIN WITH MINERALS) TABS tablet Take 1 tablet by mouth daily.       Multiple Vitamins-Minerals (PRESERVISION AREDS 2 PO) Take 1 capsule by mouth in the morning and at bedtime.       naratriptan (AMERGE) 2.5 MG tablet Take 2.5 mg by mouth as needed for migraine.        NARCAN 4 MG/0.1ML LIQD nasal spray kit Place 0.4 mg into the nose once.        OXYGEN Inhale 3 L into the lungs as  needed (shortness of breath).       rOPINIRole (REQUIP) 2 MG tablet Take 4 mg by mouth at bedtime.       traZODone (DESYREL) 50 MG tablet Take 50-100 mg by mouth at bedtime.       valACYclovir (VALTREX) 1000 MG tablet Take 1,000 mg by mouth 2 (two) times daily as needed (outbreak).        verapamil (CALAN-SR) 120 MG CR tablet Take 120 mg by mouth daily.          No current facility-administered medications for this visit.      Imaging Results (Last 48 hours)  No results found.     Review of Systems:   A ROS was performed including pertinent positives and negatives as documented in the HPI.   Physical  Exam :   Constitutional: NAD and appears stated age Neurological: Alert and oriented Psych: Appropriate affect and cooperative There were no vitals taken for this visit.    Comprehensive Musculoskeletal Exam:     Musculoskeletal Exam      Inspection Right Left  Skin No atrophy or winging No atrophy or winging  Palpation      Tenderness Glenohumeral Glenohumeral  Range of Motion      Flexion (passive) 160 160  Flexion (active) 150 150  Abduction 140 140  ER at the side 50 50  Can reach behind back to L1 L1  Strength        4 out of 5 with pain and forward elevation Full  Special Tests      Pseudoparalytic No No  Neurologic      Fires PIN, radial, median, ulnar, musculocutaneous, axillary, suprascapular, long thoracic, and spinal accessory innervated muscles. No abnormal sensibility  Vascular/Lymphatic      Radial Pulse 2+ 2+  Cervical Exam      Patient has symmetric cervical range of motion with negative Spurling's test.  Special Test:positive Neer impingement        Imaging:   Xray (right shoulder 3 views 3 views left shoulder): Acromiohumeral interval narrowing with significant glenohumeral degenerative findings bilateral shoulders   MRI (right shoulder): Severe glenohumeral osteoarthritis with superior migration of the humeral head in the setting of a thin rotator  cuff with cystic changes involving the insertion   I personally reviewed and interpreted the radiographs.     Assessment:   73 y.o. female right-hand-dominant female with right as well as left shoulder pain in the setting of severe glenohumeral osteoarthritis.  At today's visit she has had multiple injections which are no longer effective for her.  Given this we did discuss additional treatment options.  I discussed that I am somewhat hesitant the results that would be associated with physical therapy as I do believe that she is developing rotator cuff arthropathy that I do believe her pain would be quite significant as she works for range of motion and strengthening.  Given this we did discuss possible reverse shoulder arthroplasty.  At this time she has had multiple injections in the left shoulder and I did specifically discuss that ultimately I do believe she would be a candidate for left shoulder reverse shoulder arthroplasty.  She would like to proceed with this     Plan :     -Plan for left shoulder reverse shoulder arthroplasty           After a lengthy discussion of treatment options, including risks, benefits, alternatives, complications of surgical and nonsurgical conservative options, the patient elected surgical repair.    The patient  is aware of the material risks  and complications including, but not limited to injury to adjacent structures, neurovascular injury, infection, numbness, bleeding, implant failure, thermal burns, stiffness, persistent pain, failure to heal, disease transmission from allograft, need for further surgery, dislocation, anesthetic risks, blood clots, risks of death,and others. The probabilities of surgical success and failure discussed with patient given their particular co-morbidities.The time and nature of expected rehabilitation and recovery was discussed.The patient's questions were all answered preoperatively.  No barriers to understanding were noted. I  explained the natural history of the disease process and Rx rationale.  I explained to the patient what I considered to be reasonable expectations given their personal situation.  The final treatment plan was arrived at through a shared patient decision making  process model.           I personally saw and evaluated the patient, and participated in the management and treatment plan.   Huel Cote, MD Attending Physician, Orthopedic Surgery   This document was dictated using Dragon voice recognition software. A reasonable attempt at proof reading has been made to minimize errors.

## 2024-01-24 NOTE — Anesthesia Procedure Notes (Signed)
 Procedure Name: Intubation Date/Time: 01/24/2024 12:23 PM  Performed by: Georgianne Fick D, CRNAPre-anesthesia Checklist: Patient identified, Emergency Drugs available, Suction available and Patient being monitored Patient Re-evaluated:Patient Re-evaluated prior to induction Oxygen Delivery Method: Circle System Utilized Preoxygenation: Pre-oxygenation with 100% oxygen Induction Type: IV induction Ventilation: Mask ventilation without difficulty Laryngoscope Size: Miller and 3 Grade View: Grade I Tube type: Oral Tube size: 7.0 mm Number of attempts: 1 Airway Equipment and Method: Stylet and Oral airway Placement Confirmation: ETT inserted through vocal cords under direct vision, positive ETCO2 and breath sounds checked- equal and bilateral Secured at: 20 cm Tube secured with: Tape Dental Injury: Teeth and Oropharynx as per pre-operative assessment

## 2024-01-24 NOTE — Transfer of Care (Signed)
 Immediate Anesthesia Transfer of Care Note  Patient: [name]  Procedure(s) Performed: LEFT REVERSE SHOULDER ARTHROPLASTY (Left: Shoulder)  Patient Location: PACU  Anesthesia Type:General  Level of Consciousness: awake, alert , and oriented  Airway & Oxygen Therapy: Patient Spontanous Breathing and Patient connected to nasal cannula oxygen  Post-op Assessment: Report given to RN and Post -op Vital signs reviewed and stable  Post vital signs: Reviewed and stable  Last Vitals:  Vitals Value Taken Time  BP 91/75 01/24/24 1422  Temp    Pulse 75 01/24/24 1425  Resp 15 01/24/24 1425  SpO2 97 % 01/24/24 1425  Vitals shown include unfiled device data.  Last Pain:  Vitals:   01/24/24 1108  TempSrc:   PainSc: 6       Patients Stated Pain Goal: 4 (01/24/24 1108)  Complications: No notable events documented.

## 2024-01-24 NOTE — Discharge Instructions (Signed)
 Discharge Instructions    Attending Surgeon: Huel Cote, MD Office Phone Number: [phone]   Diagnosis and Procedures:    Surgeries Performed: Left shoulder reverse shoulder arthroplasty  Discharge Plan:    Diet: Resume usual diet. Begin with light or bland foods.  Drink plenty of fluids.  Activity:  Keep sling and dressing in place until your follow up visit in Physical Therapy You are advised to go home directly from the hospital or surgical center. Restrict your activities.  GENERAL INSTRUCTIONS: 1.  Keep your surgical site elevated above your heart for at least 5-7 days or longer to prevent swelling. This will improve your comfort and your overall recovery following surgery.     2. Please call Dr. Serena Croissant office at [phone] with questions Monday-Friday during business hours. If no one answers, please leave a message and someone should get back to the patient within 24 hours. For emergencies please call 911 or proceed to the emergency room.   3. Patient to notify surgical team if experiences any of the following: Bowel/Bladder dysfunction, uncontrolled pain, nerve/muscle weakness, incision with increased drainage or redness, nausea/vomiting and Fever greater than 101.0 F.  Be alert for signs of infection including redness, streaking, odor, fever or chills. Be alert for excessive pain or bleeding and notify your surgeon immediately.  WOUND INSTRUCTIONS:   Leave your dressing/cast/splint in place until your post operative visit.  Keep it clean and dry.  Always keep the incision clean and dry until the staples/sutures are removed. If there is no drainage from the incision you should keep it open to air. If there is drainage from the incision you must keep it covered at all times until the drainage stops  Do not soak in a bath tub, hot tub, pool, lake or other body of water until 21 days after your surgery and your incision is completely dry and healed.  If you  have removable sutures (or staples) they must be removed 10-14 days (unless otherwise instructed) from the day of your surgery.     1)  Elevate the extremity as much as possible.  2)  Keep the dressing clean and dry.  3)  Please call us if the dressing becomes wet or dirty.  4)  If you are experiencing worsening pain or worsening swelling, please call.     MEDICATIONS: Resume all previous home medications at the previous prescribed dose and frequency unless otherwise noted Start taking the  pain medications on an as-needed basis as prescribed  Please taper down pain medication over the next week following surgery.  Ideally you should not require a refill of any narcotic pain medication.  Take pain medication with food to minimize nausea. In addition to the prescribed pain medication, you may take over-the-counter pain relievers such as Tylenol.  Do NOT take additional tylenol if your pain medication already has tylenol in it.  Aspirin 325mg  daily per bottle instructions. Narcotic Policy: Per [facility] clinic policy, our goal is ensure optimal postoperative pain control with a multimodal pain management strategy. For all [facility] patients, our goal is to wean post-operative narcotic medications by 6 weeks post-operatively, and many times sooner. If this is not possible due to utilization of pain medication prior to surgery, your [facility] doctor will support your acute post-operative pain control for the first 6 weeks postoperatively, with a plan to transition you back to your primary pain team following that. Cyndia Skeeters will work to ensure a Therapist, occupational.  FOLLOWUP INSTRUCTIONS: 1. Follow up at the Physical Therapy Clinic 3-4 days following surgery. This appointment should be scheduled unless other arrangements have been made.The Physical Therapy scheduling number is [phone] if an appointment has not already been arranged.  2. Contact Dr. Serena Croissant office during office hours at  [phone] or the practice after hours line at [phone] for non-emergencies. For medical emergencies call 911.   Discharge Location: Home

## 2024-01-24 NOTE — Brief Op Note (Signed)
   Brief Op Note  Date of Surgery: 01/24/2024  Preoperative Diagnosis: LEFT GLENOHUMERAL OSTEOARTHRITIS  Postoperative Diagnosis: same  Procedure: Procedure(s): LEFT REVERSE SHOULDER ARTHROPLASTY  Implants: Implant Name Type Inv. Item Serial No. Manufacturer Lot No. LRB No. Used Action  AUG BASEPLATE 15DEG 25 WEDGE - [id number] Joint AUG BASEPLATE 15DEG 25 WEDGE [id number] [employer]  Left 1 Implanted  SCREW BONE INTRNL SM 7 - [id number] Screw SCREW BONE INTRNL SM 7 [id number] [employer]  Left 1 Implanted  GLENOSPHERE REV SHOULDER 36 - [id number] Joint GLENOSPHERE REV SHOULDER 36 [id number] [employer]  Left 1 Implanted  STEM HUMERAL SHORT STD SZ1 - [id number] Orthopedic Implant STEM HUMERAL SHORT STD SZ1 [id number] [employer]  Left 1 Implanted  INSERT REVERSED HUMERAL SIZE 1 - [id number] Orthopedic Implant INSERT REVERSED HUMERAL SIZE 1 [id number] [employer]  Left 1 Implanted  SCREW PERIPHERAL 30 - [id number] Screw SCREW PERIPHERAL 30  [employer]  Left 3 Implanted  SCREW 5.0X18 - [id number] Screw SCREW 5.0X18  [employer]  Left 1 Implanted  ANCHOR JUGGERKNOT SOFT 1.5 - [id number] Anchor ANCHOR JUGGERKNOT SOFT 1.5  [employer](ORTH,TRAU,BIO,SG) [id number] Left 1 Implanted    Surgeons: Surgeon(s): Huel Cote, MD  Anesthesia: General    Estimated Blood Loss: See anesthesia record  Complications: None  Condition to PACU: Stable  Benancio Deeds, MD 01/24/2024 1:42 PM

## 2024-01-24 NOTE — Op Note (Signed)
 Date of Surgery: 01/24/2024  INDICATIONS: [name] is a 73 y.o.-year-old female with left shoulder ostearthritis.  The risk and benefits of the procedure were discussed in detail and documented in the pre-operative evaluation.   PREOPERATIVE DIAGNOSIS: 1. Left shoulder osteoarthritis  POSTOPERATIVE DIAGNOSIS: Same.  PROCEDURE: 1. Left shoulder reverse shoulder arthroplasty 2. Left shoulder biceps tenodesis  SURGEON: Benancio Deeds MD  ASSISTANT: Ardeen Fillers, ATC  ANESTHESIA:  general plus interscalene nerve block  IV FLUIDS AND URINE: See anesthesia record.  ANTIBIOTICS: Ancef  ESTIMATED BLOOD LOSS: 50 mL.  IMPLANTS:  Implant Name Type Inv. Item Serial No. Manufacturer Lot No. LRB No. Used Action  AUG BASEPLATE 15DEG 25 WEDGE - [id number] Joint AUG BASEPLATE 15DEG 25 WEDGE [id number] [employer]  Left 1 Implanted  SCREW BONE INTRNL SM 7 - [id number] Screw SCREW BONE INTRNL SM 7 [id number] [employer]  Left 1 Implanted  GLENOSPHERE REV SHOULDER 36 - [id number] Joint GLENOSPHERE REV SHOULDER 36 [id number] [employer]  Left 1 Implanted  STEM HUMERAL SHORT STD SZ1 - [id number] Orthopedic Implant STEM HUMERAL SHORT STD SZ1 [id number] [employer]  Left 1 Implanted  INSERT REVERSED HUMERAL SIZE 1 - [id number] Orthopedic Implant INSERT REVERSED HUMERAL SIZE 1 [id number] [employer]  Left 1 Implanted  SCREW PERIPHERAL 30 - [id number] Screw SCREW PERIPHERAL 30  [employer]  Left 3 Implanted  SCREW 5.0X18 - [id number] Screw SCREW 5.0X18  [employer]  Left 1 Implanted  ANCHOR JUGGERKNOT SOFT 1.5 - [id number] Anchor ANCHOR JUGGERKNOT SOFT 1.5  [employer](ORTH,TRAU,BIO,SG) [id number] Left 1 Implanted    DRAINS: None  CULTURES: None  COMPLICATIONS: none  DESCRIPTION OF PROCEDURE:  Patient was identified in the preoperative holding area.  Anesthesia performed an interscalene nerve block after universal timeout was performed with nursing.  Ancef was given 1 hour prior to skin  incision.    The surgical site was scrubbed with a chlorhexidine scrub brush and alcohol.  The patient was then prepped with chlorhexidine skin prep.  The patient was subsequently taken back to the operating room.  Anesthesia was induced. The patient was transferred to the beachchair position.  All bony prominences were padded.  Final timeout was again performed.     The bony landmarks of the shoulder were marked with a marking pen. A delto-pectoral incision was made, extending up approximately 5 inches. The wound with then irrigated with dilute betadine. Cephalic vein was identified, and an protected. This was retracted medially. Subdeltoid and subpectoral lesions were released. Neurovascular structures were carefully protected. The Gelpi retractor was used to retract the deltoid and pectoralis major.    The deltoid was retracted laterally with a Brown humeral retractor.  The conjoined tendon was identified. The cleido-pectoral fascia was excised.  The axillary nerve was palpated and carefully protected throughout the procedure. The biceps tendon was found and tenodesed to the upper pec with # 2 Ethibond non-absorbable suture.  Proximally the biceps tendon was removed up to the joint.  The bicipital groove was used for a landmark to establish rotator cuff interval. The subscap was tagged with a #2 Ethibond.  At this point the subscapularis was peeled off from the lesser tuberosity with care to avoid dissection distally in order to protect the axillary nerve.  Once the joint was exposed the proximal humerus was delivered with external rotation and extension of the arm. The humerus was prepped initially by performing a humeral neck cut. This was done with the guide using 30 degrees of retroversion  as a reference.  The head portion was removed.  A medullary sounding reamer was then used.  We subsequently placed our guidewire through the center of the humeral head using the reference guide.  This was a size 1.   Metaphyseal reamer was then used.  Finally the size 1 broach was malleted into place with excellent purchase.  A tonsil clamp was used to attempt to pull this out with very good purchase   Attention was then turned to the glenoid.  Posteriorly a large Darach retractor was used.  A 360 Degree release of the subscapularis and glenoid were done. The capsule was released from the humerus.    Glenoid retractors were placed posteriorly, superiorly behind the biceps tendon and anteriorly on the glenoid neck. A 360-degree release of the capsule was performed with cautery.  The triceps was released off the inferior tubercle of the glenoid. The axillary nerve was carefully protected with the surgeon's index finger, retracting it and using cautery.   A guidepin was placed through the glenoid guide. The guidepin was drilled until it exited the cortex. The guidepin was over drilled. Next, the glenoid was prepared with the reamer down to cortical bone.  The central peg hole was totally within the scapular neck tested with the probe.  The baseplate was then placed screwed securely with good purchase in position and then secured with 4 screws. In each case, they were drilled and measured and the appropriate length screw placed with excellent rigid fixation of the baseplate. The glenosphere was placed with size based based on pre-operative templating.   The humerus was then delivered and a neutral polyethylene trial was placed.  This was brought to just the level of the reduction but not completely reduced.  A 0 final poly was selected and impacted.    Appropriate tension was noted on the conjoined tendon and deltoid muscle.  Extension was stable, external and internal rotation as well.  The subscap was pulled over but as this was not able to reach comfortably decision was made not to repair in order to prevent limited in external rotation.  The wound was then irrigated. Vancomycin powder was placed in the wound again for  infection prevention.   The wound was then closed in layers with 0 Vicryl interrupted in the deep subcu followed by 2-0 Vicryl in the superficial subcu and 3-0 nylon for skin.  An Aquacel dressing was applied as well as an Veterinary surgeon.  A shoulder immobilizer was applied.     Postoperative Plan: -The patient will begin the reverse shoulder rehab protocol  -Aspirin 325 mg daily will be used for 4 weeks for blood clot prevention -I will see the patient back in 2 weeks for first postoperative wound check     Benancio Deeds, MD 1:43 PM

## 2024-01-24 NOTE — Anesthesia Procedure Notes (Signed)
 Anesthesia Regional Block: Interscalene brachial plexus block   Pre-Anesthetic Checklist: , timeout performed,  Correct Patient, Correct Site, Correct Laterality,  Correct Procedure, Correct Position, site marked,  Risks and benefits discussed,  Surgical consent,  Pre-op evaluation,  At surgeon's request and post-op pain management  Laterality: Left  Prep: chloraprep       Needles:  Injection technique: Single-shot  Needle Type: Echogenic Stimulator Needle     Needle Length: 9cm  Needle Gauge: 21     Additional Needles:   Procedures:,,,, ultrasound used (permanent image in chart),,    Narrative:  Start time: 01/24/2024 11:41 AM End time: 01/24/2024 11:45 AM Injection made incrementally with aspirations every 5 mL.  Performed by: Personally  Anesthesiologist: Linton Rump, MD  Additional Notes: Discussed risks and benefits of nerve block including, but not limited to, prolonged and/or permanent nerve injury involving sensory and/or motor function. Monitors were applied and a time-out was performed. The nerve and associated structures were visualized under ultrasound guidance. After negative aspiration, local anesthetic was slowly injected around the nerve. There was no evidence of high pressure during the procedure. There were no paresthesias. VSS remained stable and the patient tolerated the procedure well.

## 2024-01-24 NOTE — Anesthesia Postprocedure Evaluation (Signed)
 Anesthesia Post Note  Patient: [name]  Procedure(s) Performed: LEFT REVERSE SHOULDER ARTHROPLASTY (Left: Shoulder)     Patient location during evaluation: PACU Anesthesia Type: General and Regional Level of consciousness: awake Pain management: pain level controlled Vital Signs Assessment: post-procedure vital signs reviewed and stable Respiratory status: spontaneous breathing, nonlabored ventilation and respiratory function stable Cardiovascular status: blood pressure returned to baseline and stable Postop Assessment: no apparent nausea or vomiting Anesthetic complications: no   No notable events documented.  Last Vitals:  Vitals:   01/24/24 1445 01/24/24 1500  BP: 110/73 114/68  Pulse: 83 81  Resp: 14 18  Temp:  36.7 C  SpO2: 95% 95%    Last Pain:  Vitals:   01/24/24 1445  TempSrc:   PainSc: 0-No pain                 Linton Rump

## 2024-01-24 NOTE — Interval H&P Note (Signed)
 History and Physical Interval Note:  01/24/2024 11:26 AM  [name]  has presented today for surgery, with the diagnosis of LEFT GLENOHUMERAL OSTEOARTHRITIS.  The various methods of treatment have been discussed with the patient and family. After consideration of risks, benefits and other options for treatment, the patient has consented to  Procedure(s): LEFT REVERSE SHOULDER ARTHROPLASTY (Left) as a surgical intervention.  The patient's history has been reviewed, patient examined, no change in status, stable for surgery.  I have reviewed the patient's chart and labs.  Questions were answered to the patient's satisfaction.     Huel Cote

## 2024-01-25 ENCOUNTER — Other Ambulatory Visit: Payer: Self-pay | Admitting: Orthopaedic Surgery

## 2024-01-25 ENCOUNTER — Encounter (HOSPITAL_COMMUNITY): Payer: Self-pay | Admitting: Orthopaedic Surgery

## 2024-01-25 ENCOUNTER — Encounter (HOSPITAL_BASED_OUTPATIENT_CLINIC_OR_DEPARTMENT_OTHER): Payer: Self-pay | Admitting: Orthopaedic Surgery

## 2024-01-25 ENCOUNTER — Telehealth (HOSPITAL_BASED_OUTPATIENT_CLINIC_OR_DEPARTMENT_OTHER): Payer: Self-pay | Admitting: Orthopaedic Surgery

## 2024-01-25 MED ORDER — OXYCODONE HCL 5 MG PO TABS: 5.0000 mg | ORAL_TABLET | ORAL | 0 refills | Status: DC | PRN

## 2024-01-25 NOTE — Telephone Encounter (Signed)
 Patient states that the Messler is not working for her. She needs a higher dose of what she taking or a different medication

## 2024-01-26 NOTE — Therapy (Signed)
 OUTPATIENT PHYSICAL THERAPY SHOULDER EVALUATION   Patient Name: [name] MRN: [id number] DOB:September 16, 1951, 73 y.o., female Today's Date: 01/27/2024  END OF SESSION:  PT End of Session - 01/27/24 1347     Visit Number 1    Number of Visits 25    Date for PT Re-Evaluation 04/20/24    Authorization Type medicare    Progress Note Due on Visit 10    PT Start Time 1348    PT Stop Time 1450    PT Time Calculation (min) 62 min    Activity Tolerance Patient limited by pain;No increased pain             Past Medical History:  Diagnosis Date   Anxiety    states frequent  anxiety attacks   Asthma    Cancer (HCC)    HX OF THYROID CANCER    Cold no drainage   since thursday 07-21-12, chest xray done 07-24-2012    COPD (chronic obstructive pulmonary disease) (HCC)    LOV with PFT 1/13  Dr Cherie Ouch on chart-   pt states no changes since last saw pulmonary   Depression    Fibromyalgia    states has had trigger point injections shoulders-    GERD (gastroesophageal reflux disease)    Headache(784.0)    migraines   History of COVID-19 05/2023   History of DVT of lower extremity 1972   right leg   Hyperlipidemia    Hypothyroidism    OA (osteoarthritis)    On home oxygen therapy    2 liters per minute per [place] as needed   Pneumonia    2008   Seasonal allergies    sgtates clear nasal drainage x 3- days- states no fever   Shortness of breath    oxygen at 2 liters at night, WITH EXERTION    Past Surgical History:  Procedure Laterality Date   ABDOMINAL HYSTERECTOMY  1990   bilateral salpingo-oophorectomy   BARTHOLIN GLAND CYST EXCISION  1977   PRIOR MARSIPULIZATION IN 1974   BOWEL SURGERY FOR OBSTRUCTION  1992   CLOSED MANIPULATION RIGHT KNEE REPLACEMENT  09-10-2010   DIAGNOSTIC LAPAROSCOPY  1989   x2-3 with ovarian cystectomy   FINGER ARTHROPLASTY  07/29/2012   Procedure: FINGER ARTHROPLASTY;  Surgeon: Drucilla Schmidt, MD;  Location: [facility];  Service: Orthopedics;   Laterality: Left;  Left Thumb, CMC Interpositional Arthroplasty Left Thumb,with Palmaris Longis Tendon Graft, IPSI Lateral      HEMORRHOID SURGERY  1988   KNEE ARTHROSCOPY  10-26-2008   AND REMOVAL 2 SCREWS PROXIMAL TIBIA  OF RIGHT KNEE   KNEE ARTHROTOMY  LAST ONE 2000   right x 3 prior to replacement   LEFT SHOULDER ARTHROSCOPY  03-02-2012   REVERSE SHOULDER ARTHROPLASTY Left 01/24/2024   Procedure: LEFT REVERSE SHOULDER ARTHROPLASTY;  Surgeon: Huel Cote, MD;  Location: [facility];  Service: Orthopedics;  Laterality: Left;   THYROID LOBECTOMY  2009   PARTIAL LEFT LOWER LOBECTOMY   TONSILLECTOMY  1959   TOTAL KNEE ARTHROPLASTY  07-31-2010   OA  RIGHT KNEE   TOTAL KNEE ARTHROPLASTY Left 10/03/2020   Procedure: LEFT TOTAL KNEE ARTHROPLASTY;  Surgeon: Kathryne Hitch, MD;  Location: [facility];  Service: Orthopedics;  Laterality: Left;   Patient Active Problem List   Diagnosis Date Noted   Primary osteoarthritis, left shoulder 01/24/2024   Hyperlipidemia 12/31/2021   Atypical chest pain 12/31/2021   Tobacco abuse 12/31/2021   Status post total left knee replacement  10/04/2020   Unilateral primary osteoarthritis, left knee 10/03/2020   Status post total knee replacement, left 10/03/2020   Constipation 09/25/2020   Exocrine pancreatic insufficiency 09/25/2020   Unilateral primary osteoarthritis, left hip 08/22/2020   Chronic pain of left knee 08/22/2020   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   Epigastric pain 03/08/2020   Impingement syndrome of both shoulders 08/04/2018   Chronic tension-type headache, not intractable 12/02/2017   Bilateral elbow joint pain 08/11/2017   Chronic pain of both shoulders 08/11/2017   Chronic fatigue syndrome with fibromyalgia 05/29/2015   Back muscle spasm 04/05/2015   Chronic migraine without aura without status migrainosus, not intractable 07/01/2013   Depression 05/08/2013   Lumbar facet arthropathy 04/13/2013   Chronic,  continuous use of opioids 01/12/2013   DDD (degenerative disc disease), cervical 01/12/2013   Raynaud's disease 01/12/2013   Right knee pain 01/12/2013   Scoliosis 01/12/2013   Lesion of lip 05/30/2012   Dysphonia 09/28/2011   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    PCP: Ollen Bowl, MD  REFERRING PROVIDER: Huel Cote, MD   REFERRING DIAG: M19.012 (ICD-10-CM) - Primary osteoarthritis, left shoulder  THERAPY DIAG:  Left shoulder pain, unspecified chronicity  Abnormal posture  Muscle weakness (generalized)  Rationale for Evaluation and Treatment: Rehabilitation  ONSET DATE: s/p rTSA w/ biceps tenodesis 01/24/24  SUBJECTIVE:                                                                                                                                                                                      SUBJECTIVE STATEMENT: Pt arrives via transport chair w/ her spouse (states she left her cane at home). He is present for majority of session w/ pt consent, has been providing assist for pt since her surgery. Pt also notes husband tends to perform majority of household tasks at baseline.  Pt states she has had quite a bit of pain since surgery, also reports some L hand numbness. She states she has been using her sling full time but does not think she has donned it correctly, has had difficulty managing straps. Has been sleeping in her bed but trying to do so propped up with pillow support.  Hand dominance: Right  PERTINENT HISTORY: anxiety, asthma, cancer, COPD, depression, GERD, headaches, home O2 (pt denies), hx LE DVT (1970s)  PAIN:  Are you having pain: 7/10 Location/description: L shoulder globally Best-worst over past week: 7-8/10  - aggravating factors: movement, sling use - Easing factors: medication, icing    PRECAUTIONS: L shoulder - [facility] rTSA protocol Post op timeline: 2 weeks - 02/07/24 4 weeks - 02/21/24 6 weeks - 03/06/24 8 weeks -  03/20/24 10 weeks - 04/03/24 12 weeks - 04/17/24   WEIGHT BEARING RESTRICTIONS: Yes NWB  FALLS:  Has patient fallen in last 6 months? No but does endorse balance issues, use of cane   LIVING ENVIRONMENT: 3 level home - has rails Prior to surgery was independent but had pain Uses a cane most of the time   OCCUPATION: Retired - nursing and accounting   PLOF: Independent with cane use, spouse performs majority of house work per pt report - she enjoys Public librarian, arts and crafts, tending to plants   PATIENT GOALS: reduce pain  NEXT MD VISIT: 02/09/24  OBJECTIVE:  Note: Objective measures were completed at Evaluation unless otherwise noted.  DIAGNOSTIC FINDINGS:  S/p rTSA 01/24/24  PATIENT SURVEYS:  QuickDASH: deferred   COGNITION: Overall cognitive status: Within functional limits for tasks assessed - she does note that pain medications seem to affect her memory/conversation at times      SENSATION: Subjective L hand numbness   POSTURE: Use of sling ; significant guarding of posture LUE and UT   UPPER EXTREMITY ROM:  ROM Right eval Left eval  Shoulder flexion A: WFL P: ~30 deg limited more by muscle guarding than pain   Shoulder abduction  P: ~40deg limited more so by muscle guarding      Shoulder internal rotation    Shoulder external rotation    Elbow flexion    Elbow extension    Wrist flexion    Wrist extension     (Blank rows = not tested) (Key: WFL = within functional limits not formally assessed, * = concordant pain, s = stiffness/stretching sensation, NT = not tested)  Comments:    UPPER EXTREMITY MMT:  MMT Right eval Left eval  Shoulder flexion    Shoulder extension    Shoulder abduction    Shoulder extension    Shoulder internal rotation    Shoulder external rotation    Elbow flexion    Elbow extension    Grip strength    (Blank rows = not tested)  (Key: WFL = within functional limits not formally assessed, * = concordant pain, s =  stiffness/stretching sensation, NT = not tested)  Comments: deferred on eval given acuity of surgery  JOINT MOBILITY TESTING:  Deferred given acuity of surgery  PALPATION:  Deferred given acuity of surgery   OBSERVATION: She has a fair amount of bruising about her L upper arm with a pocket of what appears to be swelling about the middle portion of medial upper arm. She also has a fairly significant amount of bleeding/drainage about her bandage upon inspection, pt is unable to describe if this is changing since surgery.                                                                                                                              TREATMENT DATE:  [facility] Adult PT Treatment:  DATE: 01/27/24 Therapeutic Exercise: Scapular retraction emphasis on appropriate elbow positioning, avoiding extension, performing in sling Wrist ROM HEP handout + education  Self Care: Significant time spent with education on post op restrictions, donning/doffing sling and appropriate adjustments as needed, red flag monitoring    PATIENT EDUCATION: Education details: Pt education on PT impairments, prognosis, and POC. Informed consent. Rationale for interventions, safe/appropriate HEP performance, post op restrictions, sling use Person educated: Patient and spouse Education method: Explanation, Demonstration, Tactile cues, Verbal cues Education comprehension: verbalized understanding, returned demonstration, verbal cues required, tactile cues required, and needs further education    HOME EXERCISE PROGRAM: Access Code: [email or web address] URL: [email or web address] Date: 01/27/2024 Prepared by: Fransisco Hertz  Program Notes - with scapular retraction please perform in sling, don't let elbow move backwards  Exercises - Seated Scapular Retraction  - 2-3 x daily - 1 sets - 8 reps - Wrist Flexion and Extension With Shoulder Sling  - 2-3 x daily - 1 sets  - 10 reps  ASSESSMENT:  CLINICAL IMPRESSION: Patient is a 73 y.o. woman who was seen today for physical therapy evaluation and treatment for shoulder pain s/p rTSA on 01/24/24. Pt arrived w/ report of consistently high pain since surgery and has been using sling, but did not think she is utilizing it correctly. On exam her ROM was limited primarily by pain/muscle guarding rather than overt stiffness. On observation she did have a fair amount of bruising but rather significant saturation of her bandage in context of her proximity to surgery. She initially stated she has had some fevers/chills but was unable to describe the timeline on this, thought it may have been "about three days ago" and did note that she oftentimes feels she comes down with colds and this felt similar. She did not endorse any other red flag symptoms, but given the extent of her drainage/bleeding this close to surgery this writer did encourage her to go upstairs and check in with surgical team out of abundance of caution. We deferred prescription of PROM exercises at home given high pain levels today and significantly increased time spent w/ sling to ensure improved fit, as initially straps are placed incorrectly and pt expresses difficulty donning/doffing. No adverse events, high pain levels throughout but no increase with today's activities. A few minutes after departure pt and her husband came back down and stated they were unable to see surgical team, mentioned they may stop by ED while they were in the building. This Clinical research associate did go over red flags and symptoms (fevers/chills, SOB, redness/streaking, etc) that would warrant more emergent work up, none of which appeared to be present on exam today. This Clinical research associate also reached out to Careers adviser and ATC via secure chat to notify them of fairly significant bandage saturation and today's evaluation. Pt and spouse departed session in no acute distress, all questions/concerns addressed appropriately from  PT perspective.   OBJECTIVE IMPAIRMENTS: decreased activity tolerance, decreased endurance, decreased ROM, decreased strength, impaired UE functional use, postural dysfunction, and pain.   ACTIVITY LIMITATIONS: carrying, lifting, bending, sitting, standing, sleeping, bathing, toileting, dressing, self feeding, reach over head, hygiene/grooming, and locomotion level  PARTICIPATION LIMITATIONS: meal prep, cleaning, laundry, and community activity  PERSONAL FACTORS: Age, Time since onset of injury/illness/exacerbation, and 3+ comorbidities: anxiety, asthma, cancer, COPD, depression, GERD, headaches, home O2 (pt denies use), hx LE DVT  are also affecting patient's functional outcome.   REHAB POTENTIAL: Fair given comorbidities and high pain levels  CLINICAL DECISION MAKING: Evolving/moderate complexity  EVALUATION COMPLEXITY:  Moderate   GOALS:   SHORT TERM GOALS: Target date: 03/09/2024  Pt will demonstrate appropriate understanding and performance of initially prescribed HEP in order to facilitate improved independence with management of symptoms.  Baseline: HEP established  Goal status: INITIAL   2. Pt will report at least 25% improvement in overall pain levels over past week in order to facilitate improved tolerance to typical daily activities.   Baseline: 7-8/10  Goal status: INITIAL    LONG TERM GOALS: Target date: 04/20/2024  Pt will improve at least MDC on Quick DASH in order to indicate reduced levels of disability due to shoulder pain (MDC 16-20pts).  Baseline: QuickDASH TBD   Goal status: INITIAL  2.  Pt will demonstrate at least 140 degrees of active shoulder elevation on surgical limb in order to demonstrate improved tolerance to functional movement patterns such as reaching overhead.  Baseline: see ROM chart above Goal status: INITIAL  3.  Pt will demonstrate at least 4+/5 shoulder flex/abd MMT for improved symmetry of UE strength and improved tolerance to functional  movements.  Baseline: MMT deferred on eval given acuity of surgery Goal status: INITIAL  4. Pt will report/demonstrate ability to perform upper body dressing with less than 2 point increase in pain on NPS in order to indicate improved tolerance/independence with ADLs.   Baseline: receiving assist from husband   Goal status: INITIAL   5. Pt will report at least 50% decrease in overall pain levels in past week in order to facilitate improved tolerance to basic ADLs/mobility.   Baseline: 7-8/10  Goal status: INITIAL    PLAN:  PT FREQUENCY: 2x/week  PT DURATION: 12 weeks  PLANNED INTERVENTIONS: 97164- PT Re-evaluation, 97110-Therapeutic exercises, 97530- Therapeutic activity, 97112- Neuromuscular re-education, 97535- Self Care, 78295- Manual therapy, 97016- Vasopneumatic device, Patient/Family education, Balance training, Stair training, Taping, Dry Needling, Joint mobilization, Cryotherapy, and Moist heat  PLAN FOR NEXT SESSION: Review/update HEP PRN. Progress per [facility] rTSA protocol, mindful of biceps tenodesis. Symptom modification strategies as indicated/appropriate.    Ashley Murrain PT, DPT 01/27/2024 4:07 PM

## 2024-01-27 ENCOUNTER — Other Ambulatory Visit: Payer: Self-pay

## 2024-01-27 ENCOUNTER — Encounter (HOSPITAL_BASED_OUTPATIENT_CLINIC_OR_DEPARTMENT_OTHER): Payer: Self-pay | Admitting: Physical Therapy

## 2024-01-27 ENCOUNTER — Ambulatory Visit (HOSPITAL_BASED_OUTPATIENT_CLINIC_OR_DEPARTMENT_OTHER): Payer: Medicare Other | Attending: Orthopaedic Surgery | Admitting: Physical Therapy

## 2024-01-27 DIAGNOSIS — R293 Abnormal posture: Secondary | ICD-10-CM | POA: Insufficient documentation

## 2024-01-27 DIAGNOSIS — M25512 Pain in left shoulder: Secondary | ICD-10-CM

## 2024-01-27 DIAGNOSIS — M19012 Primary osteoarthritis, left shoulder: Secondary | ICD-10-CM | POA: Insufficient documentation

## 2024-01-27 DIAGNOSIS — M6281 Muscle weakness (generalized): Secondary | ICD-10-CM | POA: Diagnosis not present

## 2024-01-28 ENCOUNTER — Encounter (HOSPITAL_BASED_OUTPATIENT_CLINIC_OR_DEPARTMENT_OTHER): Admitting: Student

## 2024-01-28 ENCOUNTER — Ambulatory Visit (HOSPITAL_BASED_OUTPATIENT_CLINIC_OR_DEPARTMENT_OTHER): Admitting: Orthopaedic Surgery

## 2024-01-28 DIAGNOSIS — M19012 Primary osteoarthritis, left shoulder: Secondary | ICD-10-CM

## 2024-01-28 NOTE — Progress Notes (Signed)
 Presents today for wound check and dressing change.  Incision is well-appearing

## 2024-02-01 ENCOUNTER — Other Ambulatory Visit: Payer: Self-pay | Admitting: Orthopaedic Surgery

## 2024-02-01 ENCOUNTER — Telehealth (HOSPITAL_BASED_OUTPATIENT_CLINIC_OR_DEPARTMENT_OTHER): Payer: Self-pay | Admitting: Orthopaedic Surgery

## 2024-02-01 MED ORDER — HYDROCODONE-ACETAMINOPHEN 5-325 MG PO TABS: 1.0000 | ORAL_TABLET | Freq: Four times a day (QID) | ORAL | 0 refills | Status: DC | PRN

## 2024-02-01 NOTE — Telephone Encounter (Signed)
 She would like 4mg , hydrocodon.

## 2024-02-01 NOTE — Telephone Encounter (Signed)
 Patient wants refill on meds, 4mg , oxycodone  4mg , hydrocodon. Send to   [facility] on [address]

## 2024-02-02 ENCOUNTER — Ambulatory Visit (HOSPITAL_BASED_OUTPATIENT_CLINIC_OR_DEPARTMENT_OTHER): Payer: Medicare Other | Admitting: Physical Therapy

## 2024-02-02 ENCOUNTER — Encounter (HOSPITAL_BASED_OUTPATIENT_CLINIC_OR_DEPARTMENT_OTHER): Payer: Self-pay | Admitting: Physical Therapy

## 2024-02-02 ENCOUNTER — Telehealth (HOSPITAL_BASED_OUTPATIENT_CLINIC_OR_DEPARTMENT_OTHER): Payer: Self-pay | Admitting: Orthopaedic Surgery

## 2024-02-02 DIAGNOSIS — M6281 Muscle weakness (generalized): Secondary | ICD-10-CM

## 2024-02-02 DIAGNOSIS — R293 Abnormal posture: Secondary | ICD-10-CM

## 2024-02-02 DIAGNOSIS — M19012 Primary osteoarthritis, left shoulder: Secondary | ICD-10-CM | POA: Diagnosis not present

## 2024-02-02 DIAGNOSIS — M25512 Pain in left shoulder: Secondary | ICD-10-CM

## 2024-02-02 NOTE — Telephone Encounter (Signed)
 Patient needs hydromorphone 4mg . She pronounced the the meds wrong on yesterday she said  hydrocodone and she can not take that medication. Send the Send to [facility] on [address]

## 2024-02-02 NOTE — Therapy (Signed)
 OUTPATIENT PHYSICAL THERAPY TREATMENT   Patient Name: [name] MRN: [id number] DOB:18-Jul-1951, 73 y.o., female Today's Date: 02/02/2024  END OF SESSION:  PT End of Session - 02/02/24 1347     Visit Number 2    Number of Visits 25    Date for PT Re-Evaluation 04/20/24    Authorization Type medicare    Progress Note Due on Visit 10    PT Start Time 1348    PT Stop Time 1426    PT Time Calculation (min) 38 min    Activity Tolerance Patient limited by pain;No increased pain             Past Medical History:  Diagnosis Date   Anxiety    states frequent  anxiety attacks   Asthma    Cancer (HCC)    HX OF THYROID CANCER    Cold no drainage   since thursday 07-21-12, chest xray done 07-24-2012    COPD (chronic obstructive pulmonary disease) (HCC)    LOV with PFT 1/13  Dr Cherie Ouch on chart-   pt states no changes since last saw pulmonary   Depression    Fibromyalgia    states has had trigger point injections shoulders-    GERD (gastroesophageal reflux disease)    Headache(784.0)    migraines   History of COVID-19 05/2023   History of DVT of lower extremity 1972   right leg   Hyperlipidemia    Hypothyroidism    OA (osteoarthritis)    On home oxygen therapy    2 liters per minute per [place] as needed   Pneumonia    2008   Seasonal allergies    sgtates clear nasal drainage x 3- days- states no fever   Shortness of breath    oxygen at 2 liters at night, WITH EXERTION    Past Surgical History:  Procedure Laterality Date   ABDOMINAL HYSTERECTOMY  1990   bilateral salpingo-oophorectomy   BARTHOLIN GLAND CYST EXCISION  1977   PRIOR MARSIPULIZATION IN 1974   BOWEL SURGERY FOR OBSTRUCTION  1992   CLOSED MANIPULATION RIGHT KNEE REPLACEMENT  09-10-2010   DIAGNOSTIC LAPAROSCOPY  1989   x2-3 with ovarian cystectomy   FINGER ARTHROPLASTY  07/29/2012   Procedure: FINGER ARTHROPLASTY;  Surgeon: Drucilla Schmidt, MD;  Location: [facility];  Service: Orthopedics;   Laterality: Left;  Left Thumb, CMC Interpositional Arthroplasty Left Thumb,with Palmaris Longis Tendon Graft, IPSI Lateral      HEMORRHOID SURGERY  1988   KNEE ARTHROSCOPY  10-26-2008   AND REMOVAL 2 SCREWS PROXIMAL TIBIA  OF RIGHT KNEE   KNEE ARTHROTOMY  LAST ONE 2000   right x 3 prior to replacement   LEFT SHOULDER ARTHROSCOPY  03-02-2012   REVERSE SHOULDER ARTHROPLASTY Left 01/24/2024   Procedure: LEFT REVERSE SHOULDER ARTHROPLASTY;  Surgeon: Huel Cote, MD;  Location: [facility];  Service: Orthopedics;  Laterality: Left;   THYROID LOBECTOMY  2009   PARTIAL LEFT LOWER LOBECTOMY   TONSILLECTOMY  1959   TOTAL KNEE ARTHROPLASTY  07-31-2010   OA  RIGHT KNEE   TOTAL KNEE ARTHROPLASTY Left 10/03/2020   Procedure: LEFT TOTAL KNEE ARTHROPLASTY;  Surgeon: Kathryne Hitch, MD;  Location: [facility];  Service: Orthopedics;  Laterality: Left;   Patient Active Problem List   Diagnosis Date Noted   Primary osteoarthritis, left shoulder 01/24/2024   Hyperlipidemia 12/31/2021   Atypical chest pain 12/31/2021   Tobacco abuse 12/31/2021   Status post total left knee replacement 10/04/2020  Unilateral primary osteoarthritis, left knee 10/03/2020   Status post total knee replacement, left 10/03/2020   Constipation 09/25/2020   Exocrine pancreatic insufficiency 09/25/2020   Unilateral primary osteoarthritis, left hip 08/22/2020   Chronic pain of left knee 08/22/2020   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   Epigastric pain 03/08/2020   Impingement syndrome of both shoulders 08/04/2018   Chronic tension-type headache, not intractable 12/02/2017   Bilateral elbow joint pain 08/11/2017   Chronic pain of both shoulders 08/11/2017   Chronic fatigue syndrome with fibromyalgia 05/29/2015   Back muscle spasm 04/05/2015   Chronic migraine without aura without status migrainosus, not intractable 07/01/2013   Depression 05/08/2013   Lumbar facet arthropathy 04/13/2013   Chronic,  continuous use of opioids 01/12/2013   DDD (degenerative disc disease), cervical 01/12/2013   Raynaud's disease 01/12/2013   Right knee pain 01/12/2013   Scoliosis 01/12/2013   Lesion of lip 05/30/2012   Dysphonia 09/28/2011   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    PCP: Ollen Bowl, MD  REFERRING PROVIDER: Huel Cote, MD   REFERRING DIAG: M19.012 (ICD-10-CM) - Primary osteoarthritis, left shoulder  THERAPY DIAG:  Left shoulder pain, unspecified chronicity  Abnormal posture  Muscle weakness (generalized)  Rationale for Evaluation and Treatment: Rehabilitation  ONSET DATE: s/p rTSA w/ biceps tenodesis 01/24/24  SUBJECTIVE:                                                                                                                                                                                      SUBJECTIVE STATEMENT: Patient  states doing exercises and probably using her arm more than she should.    EVAL: Pt arrives via transport chair w/ her spouse (states she left her cane at home). He is present for majority of session w/ pt consent, has been providing assist for pt since her surgery. Pt also notes husband tends to perform majority of household tasks at baseline.  Pt states she has had quite a bit of pain since surgery, also reports some L hand numbness. She states she has been using her sling full time but does not think she has donned it correctly, has had difficulty managing straps. Has been sleeping in her bed but trying to do so propped up with pillow support.  Hand dominance: Right  PERTINENT HISTORY: anxiety, asthma, cancer, COPD, depression, GERD, headaches, home O2 (pt denies), hx LE DVT (1970s)  PAIN:  Are you having pain: 7/10 Location/description: L shoulder globally Best-worst over past week: 7-8/10  - aggravating factors: movement, sling use - Easing factors: medication, icing    PRECAUTIONS: L shoulder - [facility] rTSA  protocol Post op  timeline: 2 weeks - 02/07/24 4 weeks - 02/21/24 6 weeks - 03/06/24 8 weeks - 03/20/24 10 weeks - 04/03/24 12 weeks - 04/17/24   WEIGHT BEARING RESTRICTIONS: Yes NWB  FALLS:  Has patient fallen in last 6 months? No but does endorse balance issues, use of cane   LIVING ENVIRONMENT: 3 level home - has rails Prior to surgery was independent but had pain Uses a cane most of the time   OCCUPATION: Retired - nursing and accounting   PLOF: Independent with cane use, spouse performs majority of house work per pt report - she enjoys Public librarian, arts and crafts, tending to plants   PATIENT GOALS: reduce pain  NEXT MD VISIT: 02/09/24  OBJECTIVE:  Note: Objective measures were completed at Evaluation unless otherwise noted.  DIAGNOSTIC FINDINGS:  S/p rTSA 01/24/24  PATIENT SURVEYS:  QuickDASH: deferred   COGNITION: Overall cognitive status: Within functional limits for tasks assessed - she does note that pain medications seem to affect her memory/conversation at times      SENSATION: Subjective L hand numbness   POSTURE: Use of sling ; significant guarding of posture LUE and UT   UPPER EXTREMITY ROM:  ROM Right eval Left eval  Shoulder flexion A: WFL P: ~30 deg limited more by muscle guarding than pain   Shoulder abduction  P: ~40deg limited more so by muscle guarding      Shoulder internal rotation    Shoulder external rotation    Elbow flexion    Elbow extension    Wrist flexion    Wrist extension     (Blank rows = not tested) (Key: WFL = within functional limits not formally assessed, * = concordant pain, s = stiffness/stretching sensation, NT = not tested)  Comments:    UPPER EXTREMITY MMT:  MMT Right eval Left eval  Shoulder flexion    Shoulder extension    Shoulder abduction    Shoulder extension    Shoulder internal rotation    Shoulder external rotation    Elbow flexion    Elbow extension    Grip strength    (Blank rows = not tested)   (Key: WFL = within functional limits not formally assessed, * = concordant pain, s = stiffness/stretching sensation, NT = not tested)  Comments: deferred on eval given acuity of surgery  JOINT MOBILITY TESTING:  Deferred given acuity of surgery  PALPATION:  Deferred given acuity of surgery   OBSERVATION: She has a fair amount of bruising about her L upper arm with a pocket of what appears to be swelling about the middle portion of medial upper arm. She also has a fairly significant amount of bleeding/drainage about her bandage upon inspection, pt is unable to describe if this is changing since surgery.                                                                                                                              TREATMENT  DATE:  02/02/24 PROM: flexion and ER within protocol limitations Supine well arm assisted flexion 2 x 5 Seated AAROM ER with cane 1 x 10 Seated scap retraction 1 x 10 AAROM elbow flexion 2 x 10    [facility] Adult PT Treatment:                                                DATE: 01/27/24 Therapeutic Exercise: Scapular retraction emphasis on appropriate elbow positioning, avoiding extension, performing in sling Wrist ROM HEP handout + education  Self Care: Significant time spent with education on post op restrictions, donning/doffing sling and appropriate adjustments as needed, red flag monitoring    PATIENT EDUCATION: Education details: Pt education on PT impairments, prognosis, and POC. Informed consent. Rationale for interventions, safe/appropriate HEP performance, post op restrictions, sling use Person educated: Patient and spouse Education method: Explanation, Demonstration, Tactile cues, Verbal cues Education comprehension: verbalized understanding, returned demonstration, verbal cues required, tactile cues required, and needs further education    HOME EXERCISE PROGRAM: Access Code: [email or web address] URL:  [email or web address]  ASSESSMENT:  CLINICAL IMPRESSION: Patient with PROM/AAROM to 90 flexion 20 ER. Intermittent c/o pain but tolerates session well. Review of HEP and updated today. Patient will continue to benefit from physical therapy in order to improve function and reduce impairment.   EVAL: Patient is a 73 y.o. woman who was seen today for physical therapy evaluation and treatment for shoulder pain s/p rTSA on 01/24/24. Pt arrived w/ report of consistently high pain since surgery and has been using sling, but did not think she is utilizing it correctly. On exam her ROM was limited primarily by pain/muscle guarding rather than overt stiffness. On observation she did have a fair amount of bruising but rather significant saturation of her bandage in context of her proximity to surgery. She initially stated she has had some fevers/chills but was unable to describe the timeline on this, thought it may have been "about three days ago" and did note that she oftentimes feels she comes down with colds and this felt similar. She did not endorse any other red flag symptoms, but given the extent of her drainage/bleeding this close to surgery this writer did encourage her to go upstairs and check in with surgical team out of abundance of caution. We deferred prescription of PROM exercises at home given high pain levels today and significantly increased time spent w/ sling to ensure improved fit, as initially straps are placed incorrectly and pt expresses difficulty donning/doffing. No adverse events, high pain levels throughout but no increase with today's activities. A few minutes after departure pt and her husband came back down and stated they were unable to see surgical team, mentioned they may stop by ED while they were in the building. This Clinical research associate did go over red flags and symptoms (fevers/chills, SOB, redness/streaking, etc) that would warrant more emergent work up, none of which appeared to be  present on exam today. This Clinical research associate also reached out to Careers adviser and ATC via secure chat to notify them of fairly significant bandage saturation and today's evaluation. Pt and spouse departed session in no acute distress, all questions/concerns addressed appropriately from PT perspective.   OBJECTIVE IMPAIRMENTS: decreased activity tolerance, decreased endurance, decreased ROM, decreased strength, impaired UE functional use, postural dysfunction, and pain.   ACTIVITY LIMITATIONS: carrying, lifting, bending, sitting, standing, sleeping, bathing, toileting,  dressing, self feeding, reach over head, hygiene/grooming, and locomotion level  PARTICIPATION LIMITATIONS: meal prep, cleaning, laundry, and community activity  PERSONAL FACTORS: Age, Time since onset of injury/illness/exacerbation, and 3+ comorbidities: anxiety, asthma, cancer, COPD, depression, GERD, headaches, home O2 (pt denies use), hx LE DVT  are also affecting patient's functional outcome.   REHAB POTENTIAL: Fair given comorbidities and high pain levels  CLINICAL DECISION MAKING: Evolving/moderate complexity  EVALUATION COMPLEXITY: Moderate   GOALS:   SHORT TERM GOALS: Target date: 03/09/2024  Pt will demonstrate appropriate understanding and performance of initially prescribed HEP in order to facilitate improved independence with management of symptoms.  Baseline: HEP established  Goal status: INITIAL   2. Pt will report at least 25% improvement in overall pain levels over past week in order to facilitate improved tolerance to typical daily activities.   Baseline: 7-8/10  Goal status: INITIAL    LONG TERM GOALS: Target date: 04/20/2024  Pt will improve at least MDC on Quick DASH in order to indicate reduced levels of disability due to shoulder pain (MDC 16-20pts).  Baseline: QuickDASH TBD   Goal status: INITIAL  2.  Pt will demonstrate at least 140 degrees of active shoulder elevation on surgical limb in order to demonstrate  improved tolerance to functional movement patterns such as reaching overhead.  Baseline: see ROM chart above Goal status: INITIAL  3.  Pt will demonstrate at least 4+/5 shoulder flex/abd MMT for improved symmetry of UE strength and improved tolerance to functional movements.  Baseline: MMT deferred on eval given acuity of surgery Goal status: INITIAL  4. Pt will report/demonstrate ability to perform upper body dressing with less than 2 point increase in pain on NPS in order to indicate improved tolerance/independence with ADLs.   Baseline: receiving assist from husband   Goal status: INITIAL   5. Pt will report at least 50% decrease in overall pain levels in past week in order to facilitate improved tolerance to basic ADLs/mobility.   Baseline: 7-8/10  Goal status: INITIAL    PLAN:  PT FREQUENCY: 2x/week  PT DURATION: 12 weeks  PLANNED INTERVENTIONS: 97164- PT Re-evaluation, 97110-Therapeutic exercises, 97530- Therapeutic activity, 97112- Neuromuscular re-education, 97535- Self Care, 10272- Manual therapy, 97016- Vasopneumatic device, Patient/Family education, Balance training, Stair training, Taping, Dry Needling, Joint mobilization, Cryotherapy, and Moist heat  PLAN FOR NEXT SESSION: Review/update HEP PRN. Progress per [facility] rTSA protocol, mindful of biceps tenodesis. Symptom modification strategies as indicated/appropriate.     Reola Mosher Shagun Wordell, PT 02/02/2024, 1:50 PM

## 2024-02-09 ENCOUNTER — Ambulatory Visit (HOSPITAL_BASED_OUTPATIENT_CLINIC_OR_DEPARTMENT_OTHER): Payer: Medicare Other | Admitting: Physical Therapy

## 2024-02-09 ENCOUNTER — Ambulatory Visit (HOSPITAL_BASED_OUTPATIENT_CLINIC_OR_DEPARTMENT_OTHER)

## 2024-02-09 ENCOUNTER — Ambulatory Visit (HOSPITAL_BASED_OUTPATIENT_CLINIC_OR_DEPARTMENT_OTHER): Payer: Medicare Other | Admitting: Orthopaedic Surgery

## 2024-02-09 DIAGNOSIS — M19012 Primary osteoarthritis, left shoulder: Secondary | ICD-10-CM

## 2024-02-09 DIAGNOSIS — R293 Abnormal posture: Secondary | ICD-10-CM

## 2024-02-09 DIAGNOSIS — M25512 Pain in left shoulder: Secondary | ICD-10-CM

## 2024-02-09 DIAGNOSIS — M6281 Muscle weakness (generalized): Secondary | ICD-10-CM

## 2024-02-09 NOTE — Progress Notes (Signed)
 Post Operative Evaluation    Procedure/Date of Surgery: Left shoulder reverse shoulder arthroplasty 01/24/24  Interval History:  Presents today 2 weeks status post the above procedure.  Overall she is doing very well.  She is working on active overhead range of motion which is going nicely.   PMH/PSH/Family History/Social History/Meds/Allergies:    Past Medical History:  Diagnosis Date   Anxiety    states frequent  anxiety attacks   Asthma    Cancer (HCC)    HX OF THYROID CANCER    Cold no drainage   since thursday 07-21-12, chest xray done 07-24-2012    COPD (chronic obstructive pulmonary disease) (HCC)    LOV with PFT 1/13  Dr Cherie Ouch on chart-   pt states no changes since last saw pulmonary   Depression    Fibromyalgia    states has had trigger point injections shoulders-    GERD (gastroesophageal reflux disease)    Headache(784.0)    migraines   History of COVID-19 05/2023   History of DVT of lower extremity 1972   right leg   Hyperlipidemia    Hypothyroidism    OA (osteoarthritis)    On home oxygen therapy    2 liters per minute per [place] as needed   Pneumonia    2008   Seasonal allergies    sgtates clear nasal drainage x 3- days- states no fever   Shortness of breath    oxygen at 2 liters at night, WITH EXERTION    Past Surgical History:  Procedure Laterality Date   ABDOMINAL HYSTERECTOMY  1990   bilateral salpingo-oophorectomy   BARTHOLIN GLAND CYST EXCISION  1977   PRIOR MARSIPULIZATION IN 1974   BOWEL SURGERY FOR OBSTRUCTION  1992   CLOSED MANIPULATION RIGHT KNEE REPLACEMENT  09-10-2010   DIAGNOSTIC LAPAROSCOPY  1989   x2-3 with ovarian cystectomy   FINGER ARTHROPLASTY  07/29/2012   Procedure: FINGER ARTHROPLASTY;  Surgeon: Drucilla Schmidt, MD;  Location: [facility];  Service: Orthopedics;  Laterality: Left;  Left Thumb, CMC Interpositional Arthroplasty Left Thumb,with Palmaris Longis Tendon Graft, IPSI Lateral       HEMORRHOID SURGERY  1988   KNEE ARTHROSCOPY  10-26-2008   AND REMOVAL 2 SCREWS PROXIMAL TIBIA  OF RIGHT KNEE   KNEE ARTHROTOMY  LAST ONE 2000   right x 3 prior to replacement   LEFT SHOULDER ARTHROSCOPY  03-02-2012   REVERSE SHOULDER ARTHROPLASTY Left 01/24/2024   Procedure: LEFT REVERSE SHOULDER ARTHROPLASTY;  Surgeon: Huel Cote, MD;  Location: [facility];  Service: Orthopedics;  Laterality: Left;   THYROID LOBECTOMY  2009   PARTIAL LEFT LOWER LOBECTOMY   TONSILLECTOMY  1959   TOTAL KNEE ARTHROPLASTY  07-31-2010   OA  RIGHT KNEE   TOTAL KNEE ARTHROPLASTY Left 10/03/2020   Procedure: LEFT TOTAL KNEE ARTHROPLASTY;  Surgeon: Kathryne Hitch, MD;  Location: [facility];  Service: Orthopedics;  Laterality: Left;   Social History   Socioeconomic History   Marital status: Married    Spouse name: Not on file   Number of children: Not on file   Years of education: Not on file   Highest education level: Not on file  Occupational History   Not on file  Tobacco Use   Smoking status: Former    Current packs/day: 0.00  Average packs/day: 1 pack/day for 43.0 years (43.0 ttl pk-yrs)    Types: Cigarettes    Quit date: 09/23/2021    Years since quitting: 2.3   Smokeless tobacco: Never  Vaping Use   Vaping status: Never Used  Substance and Sexual Activity   Alcohol use: No   Drug use: Yes    Types: Marijuana    Comment: smokes once every 3 weeks   Sexual activity: Not on file  Other Topics Concern   Not on file  Social History Narrative   Not on file   Social Drivers of Health   Financial Resource Strain: Not on file  Food Insecurity: Not on file  Transportation Needs: Not on file  Physical Activity: Not on file  Stress: Not on file  Social Connections: Not on file   No family history on file. Allergies  Allergen Reactions   Codeine Nausea And Vomiting   Doxycycline Nausea And Vomiting    Pt states she can tolerate this   Penicillins Other (See Comments)    Family  cannot take / update 03/02/12 - pt. does not remember the reaction.   Sulfa Antibiotics Nausea And Vomiting   Tylenol [Acetaminophen] Other (See Comments)    Cannot take with Neurontin / update 03/02/12 - causes diarrhea   Vistaril [Hydroxyzine Hcl] Other (See Comments)     Hallucinations   Current Outpatient Medications  Medication Sig Dispense Refill   HYDROcodone-acetaminophen (NORCO/VICODIN) 5-325 MG tablet Take 1 tablet by mouth every 6 (six) hours as needed for moderate pain (pain score 4-6). 30 tablet 0   albuterol (PROVENTIL HFA;VENTOLIN HFA) 108 (90 BASE) MCG/ACT inhaler Inhale 2 puffs into the lungs every 6 (six) hours as needed for wheezing or shortness of breath.      albuterol (PROVENTIL) (2.5 MG/3ML) 0.083% nebulizer solution Take 2.5 mg by nebulization every 6 (six) hours as needed for shortness of breath or wheezing.     Ascorbic Acid (VITAMIN C) 1000 MG tablet Take 1,000 mg by mouth daily.     aspirin 325 MG EC tablet TAKE 1 TABLET BY MOUTH TWICE A DAY AFTER MEAL (Patient not taking: Reported on 01/12/2024) 30 tablet 0   atorvastatin (LIPITOR) 20 MG tablet Take 10 mg by mouth daily before breakfast.      B Complex-C (B-COMPLEX WITH VITAMIN C) tablet Take 1 tablet by mouth daily.     buPROPion (WELLBUTRIN XL) 300 MG 24 hr tablet Take 300 mg by mouth daily.     butalbital-aspirin-caffeine (FIORINAL) 50-325-40 MG capsule Take 1 capsule by mouth 2 (two) times daily as needed for headache.     cetirizine (ZYRTEC) 10 MG tablet Take 10 mg by mouth daily.     clonazePAM (KLONOPIN) 0.5 MG tablet Take 0.25-0.5 mg by mouth in the morning, at noon, and at bedtime.     CREON 36000-114000 units CPEP capsule Take 108,000-144,000 Units by mouth 3 (three) times daily with meals.     escitalopram (LEXAPRO) 10 MG tablet Take 10 mg by mouth daily.     estradiol (ESTRACE) 0.5 MG tablet Take 0.5 mg by mouth daily.     famotidine (PEPCID) 40 MG tablet Take 40 mg by mouth 2 (two) times daily.      fentaNYL (DURAGESIC) 25 MCG/HR Place 1 patch onto the skin every other day.     fluticasone (FLONASE) 50 MCG/ACT nasal spray Place 1 spray into both nostrils daily as needed for allergies.      Fluticasone-Umeclidin-Vilant 100-62.5-25 MCG/INH AEPB  Inhale 1 puff into the lungs daily.      folic acid (FOLVITE) 800 MCG tablet Take 800 mcg by mouth daily.     gabapentin (NEURONTIN) 300 MG capsule Take 600 mg by mouth 2 (two) times daily.     glycopyrrolate (ROBINUL) 1 MG tablet Take 1 mg by mouth at bedtime.     HYDROmorphone (DILAUDID) 2 MG tablet Take 2 mg by mouth 4 (four) times daily as needed for moderate pain.     HYDROmorphone (DILAUDID) 4 MG tablet Take 1 tablet (4 mg total) by mouth every 4 (four) hours as needed for severe pain (pain score 7-10). 30 tablet 0   HYDROmorphone (DILAUDID) 4 MG tablet Take 1 tablet (4 mg total) by mouth every 4 (four) hours as needed for severe pain (pain score 7-10) (if oxycodone not effective). 30 tablet 0   hydroxypropyl methylcellulose / hypromellose (ISOPTO TEARS / GONIOVISC) 2.5 % ophthalmic solution Place 1 drop into both eyes as needed for dry eyes.     levothyroxine (SYNTHROID, LEVOTHROID) 25 MCG tablet Take 25 mcg by mouth daily before breakfast.     linaclotide (LINZESS) 145 MCG CAPS capsule Take 145 mcg by mouth daily before breakfast.     Multiple Vitamin (MULTIVITAMIN WITH MINERALS) TABS tablet Take 1 tablet by mouth daily.     Multiple Vitamins-Minerals (PRESERVISION AREDS 2 PO) Take 1 capsule by mouth in the morning and at bedtime.     naratriptan (AMERGE) 2.5 MG tablet Take 2.5 mg by mouth as needed for migraine.      NARCAN 4 MG/0.1ML LIQD nasal spray kit Place 0.4 mg into the nose once.      oxyCODONE (ROXICODONE) 5 MG immediate release tablet Take 1 tablet (5 mg total) by mouth every 4 (four) hours as needed for severe pain (pain score 7-10). 30 tablet 0   OXYGEN Inhale 3 L into the lungs as needed (shortness of breath).     rOPINIRole (REQUIP)  2 MG tablet Take 4 mg by mouth at bedtime.     traZODone (DESYREL) 50 MG tablet Take 100-150 mg by mouth at bedtime.     valACYclovir (VALTREX) 1000 MG tablet Take 1,000 mg by mouth 2 (two) times daily as needed (outbreak).      verapamil (CALAN-SR) 120 MG CR tablet Take 120 mg by mouth daily.     vitamin E 180 MG (400 UNITS) capsule Take 400 Units by mouth daily.     No current facility-administered medications for this visit.   No results found.  Review of Systems:   A ROS was performed including pertinent positives and negatives as documented in the HPI.   Musculoskeletal Exam:    There were no vitals taken for this visit.  Left shoulder incision is well-appearing without erythema or drainage.  Active forward elevation is to 80 degrees.  External rotation at side is to 20 degrees.  Internal rotation deferred today  Imaging:    2 views left shoulder: Status post reverse shoulder arthroplasty without evidence of complication  I personally reviewed and interpreted the radiographs.   Assessment:   2 weeks status post reverse shoulder arthroplasty without evidence of complication.  Overall doing well.  At this time she will continue to work on active overhead range of motion.  She will continue to wear her sling at night for 2 additional weeks  Plan :    -Turn to clinic 4 weeks for reassessment      I personally saw and  evaluated the patient, and participated in the management and treatment plan.  Huel Cote, MD Attending Physician, Orthopedic Surgery  This document was dictated using Dragon voice recognition software. A reasonable attempt at proof reading has been made to minimize errors.

## 2024-02-10 ENCOUNTER — Encounter (HOSPITAL_BASED_OUTPATIENT_CLINIC_OR_DEPARTMENT_OTHER): Payer: Self-pay | Admitting: Physical Therapy

## 2024-02-10 NOTE — Therapy (Signed)
 OUTPATIENT PHYSICAL THERAPY TREATMENT   Patient Name: [name] MRN: [id number] DOB:11-22-1951, 73 y.o., female Today's Date: 02/10/2024  END OF SESSION:  PT End of Session - 02/10/24 1246     Visit Number 3    Number of Visits 25    Date for PT Re-Evaluation 04/20/24    Authorization Type medicare    PT Start Time 1345    PT Stop Time 1425    PT Time Calculation (min) 40 min    Activity Tolerance Patient limited by pain;No increased pain             Past Medical History:  Diagnosis Date   Anxiety    states frequent  anxiety attacks   Asthma    Cancer (HCC)    HX OF THYROID CANCER    Cold no drainage   since thursday 07-21-12, chest xray done 07-24-2012    COPD (chronic obstructive pulmonary disease) (HCC)    LOV with PFT 1/13  Dr Cherie Ouch on chart-   pt states no changes since last saw pulmonary   Depression    Fibromyalgia    states has had trigger point injections shoulders-    GERD (gastroesophageal reflux disease)    Headache(784.0)    migraines   History of COVID-19 05/2023   History of DVT of lower extremity 1972   right leg   Hyperlipidemia    Hypothyroidism    OA (osteoarthritis)    On home oxygen therapy    2 liters per minute per [place] as needed   Pneumonia    2008   Seasonal allergies    sgtates clear nasal drainage x 3- days- states no fever   Shortness of breath    oxygen at 2 liters at night, WITH EXERTION    Past Surgical History:  Procedure Laterality Date   ABDOMINAL HYSTERECTOMY  1990   bilateral salpingo-oophorectomy   BARTHOLIN GLAND CYST EXCISION  1977   PRIOR MARSIPULIZATION IN 1974   BOWEL SURGERY FOR OBSTRUCTION  1992   CLOSED MANIPULATION RIGHT KNEE REPLACEMENT  09-10-2010   DIAGNOSTIC LAPAROSCOPY  1989   x2-3 with ovarian cystectomy   FINGER ARTHROPLASTY  07/29/2012   Procedure: FINGER ARTHROPLASTY;  Surgeon: Drucilla Schmidt, MD;  Location: [facility];  Service: Orthopedics;  Laterality: Left;  Left Thumb, CMC  Interpositional Arthroplasty Left Thumb,with Palmaris Longis Tendon Graft, IPSI Lateral      HEMORRHOID SURGERY  1988   KNEE ARTHROSCOPY  10-26-2008   AND REMOVAL 2 SCREWS PROXIMAL TIBIA  OF RIGHT KNEE   KNEE ARTHROTOMY  LAST ONE 2000   right x 3 prior to replacement   LEFT SHOULDER ARTHROSCOPY  03-02-2012   REVERSE SHOULDER ARTHROPLASTY Left 01/24/2024   Procedure: LEFT REVERSE SHOULDER ARTHROPLASTY;  Surgeon: Huel Cote, MD;  Location: [facility];  Service: Orthopedics;  Laterality: Left;   THYROID LOBECTOMY  2009   PARTIAL LEFT LOWER LOBECTOMY   TONSILLECTOMY  1959   TOTAL KNEE ARTHROPLASTY  07-31-2010   OA  RIGHT KNEE   TOTAL KNEE ARTHROPLASTY Left 10/03/2020   Procedure: LEFT TOTAL KNEE ARTHROPLASTY;  Surgeon: Kathryne Hitch, MD;  Location: [facility];  Service: Orthopedics;  Laterality: Left;   Patient Active Problem List   Diagnosis Date Noted   Primary osteoarthritis, left shoulder 01/24/2024   Hyperlipidemia 12/31/2021   Atypical chest pain 12/31/2021   Tobacco abuse 12/31/2021   Status post total left knee replacement 10/04/2020   Unilateral primary osteoarthritis, left knee 10/03/2020  Status post total knee replacement, left 10/03/2020   Constipation 09/25/2020   Exocrine pancreatic insufficiency 09/25/2020   Unilateral primary osteoarthritis, left hip 08/22/2020   Chronic pain of left knee 08/22/2020   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   Epigastric pain 03/08/2020   Impingement syndrome of both shoulders 08/04/2018   Chronic tension-type headache, not intractable 12/02/2017   Bilateral elbow joint pain 08/11/2017   Chronic pain of both shoulders 08/11/2017   Chronic fatigue syndrome with fibromyalgia 05/29/2015   Back muscle spasm 04/05/2015   Chronic migraine without aura without status migrainosus, not intractable 07/01/2013   Depression 05/08/2013   Lumbar facet arthropathy 04/13/2013   Chronic, continuous use of opioids 01/12/2013    DDD (degenerative disc disease), cervical 01/12/2013   Raynaud's disease 01/12/2013   Right knee pain 01/12/2013   Scoliosis 01/12/2013   Lesion of lip 05/30/2012   Dysphonia 09/28/2011   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    PCP: Ollen Bowl, MD  REFERRING PROVIDER: Huel Cote, MD   REFERRING DIAG: M19.012 (ICD-10-CM) - Primary osteoarthritis, left shoulder  THERAPY DIAG:  Left shoulder pain, unspecified chronicity  Abnormal posture  Muscle weakness (generalized)  Rationale for Evaluation and Treatment: Rehabilitation  ONSET DATE: s/p rTSA w/ biceps tenodesis 01/24/24  SUBJECTIVE:                                                                                                                                                                                      SUBJECTIVE STATEMENT: Patient reports she has been doing a little better. She is still a little sore but overall she is doing well.    EVAL: Pt arrives via transport chair w/ her spouse (states she left her cane at home). He is present for majority of session w/ pt consent, has been providing assist for pt since her surgery. Pt also notes husband tends to perform majority of household tasks at baseline.  Pt states she has had quite a bit of pain since surgery, also reports some L hand numbness. She states she has been using her sling full time but does not think she has donned it correctly, has had difficulty managing straps. Has been sleeping in her bed but trying to do so propped up with pillow support.  Hand dominance: Right  PERTINENT HISTORY: anxiety, asthma, cancer, COPD, depression, GERD, headaches, home O2 (pt denies), hx LE DVT (1970s)  PAIN:  Are you having pain: 7/10 Location/description: L shoulder globally Best-worst over past week: 7-8/10  - aggravating factors: movement, sling use - Easing factors: medication, icing    PRECAUTIONS: L shoulder - [facility] rTSA protocol Post op  timeline:  2 weeks - 02/07/24 4 weeks - 02/21/24 6 weeks - 03/06/24 8 weeks - 03/20/24 10 weeks - 04/03/24 12 weeks - 04/17/24   WEIGHT BEARING RESTRICTIONS: Yes NWB  FALLS:  Has patient fallen in last 6 months? No but does endorse balance issues, use of cane   LIVING ENVIRONMENT: 3 level home - has rails Prior to surgery was independent but had pain Uses a cane most of the time   OCCUPATION: Retired - nursing and accounting   PLOF: Independent with cane use, spouse performs majority of house work per pt report - she enjoys Public librarian, arts and crafts, tending to plants   PATIENT GOALS: reduce pain  NEXT MD VISIT: 02/09/24  OBJECTIVE:  Note: Objective measures were completed at Evaluation unless otherwise noted.  DIAGNOSTIC FINDINGS:  S/p rTSA 01/24/24  PATIENT SURVEYS:  QuickDASH: deferred   COGNITION: Overall cognitive status: Within functional limits for tasks assessed - she does note that pain medications seem to affect her memory/conversation at times      SENSATION: Subjective L hand numbness   POSTURE: Use of sling ; significant guarding of posture LUE and UT   UPPER EXTREMITY ROM:  ROM Right eval Left eval  Shoulder flexion A: WFL P: ~30 deg limited more by muscle guarding than pain   Shoulder abduction  P: ~40deg limited more so by muscle guarding      Shoulder internal rotation    Shoulder external rotation    Elbow flexion    Elbow extension    Wrist flexion    Wrist extension     (Blank rows = not tested) (Key: WFL = within functional limits not formally assessed, * = concordant pain, s = stiffness/stretching sensation, NT = not tested)  Comments:    UPPER EXTREMITY MMT:  MMT Right eval Left eval  Shoulder flexion    Shoulder extension    Shoulder abduction    Shoulder extension    Shoulder internal rotation    Shoulder external rotation    Elbow flexion    Elbow extension    Grip strength    (Blank rows = not tested)  (Key: WFL = within  functional limits not formally assessed, * = concordant pain, s = stiffness/stretching sensation, NT = not tested)  Comments: deferred on eval given acuity of surgery  JOINT MOBILITY TESTING:  Deferred given acuity of surgery  PALPATION:  Deferred given acuity of surgery   OBSERVATION: She has a fair amount of bruising about her L upper arm with a pocket of what appears to be swelling about the middle portion of medial upper arm. She also has a fairly significant amount of bleeding/drainage about her bandage upon inspection, pt is unable to describe if this is changing since surgery.                                                                                                                              TREATMENT DATE:  3/20  Annual PROM: flexion and ER within protocol limitations with distraction  Trigger point release to upper trap and anterior shoulder  There-ex  Supine well arm assisted flexion 2 x 5 Seated AAROM ER with cane 1 x 10 Seated scap retraction 1 x 10 AAROM elbow flexion 2 x 10   02/02/24 PROM: flexion and ER within protocol limitations Supine well arm assisted flexion 2 x 5 Seated AAROM ER with cane 1 x 10 Seated scap retraction 1 x 10 AAROM elbow flexion 2 x 10    [facility] Adult PT Treatment:                                                DATE: 01/27/24 Therapeutic Exercise: Scapular retraction emphasis on appropriate elbow positioning, avoiding extension, performing in sling Wrist ROM HEP handout + education  Self Care: Significant time spent with education on post op restrictions, donning/doffing sling and appropriate adjustments as needed, red flag monitoring    PATIENT EDUCATION: Education details: Pt education on PT impairments, prognosis, and POC. Informed consent. Rationale for interventions, safe/appropriate HEP performance, post op restrictions, sling use Person educated: Patient and spouse Education method: Explanation, Demonstration, Tactile  cues, Verbal cues Education comprehension: verbalized understanding, returned demonstration, verbal cues required, tactile cues required, and needs further education    HOME EXERCISE PROGRAM: Access Code: [email or web address] URL: [email or web address]  ASSESSMENT:  CLINICAL IMPRESSION: The patient is making good progress. Her ER is progressing well. We kept her exercises constant today. Therapy will continue to progress as tolerated.   EVAL: Patient is a 73 y.o. woman who was seen today for physical therapy evaluation and treatment for shoulder pain s/p rTSA on 01/24/24. Pt arrived w/ report of consistently high pain since surgery and has been using sling, but did not think she is utilizing it correctly. On exam her ROM was limited primarily by pain/muscle guarding rather than overt stiffness. On observation she did have a fair amount of bruising but rather significant saturation of her bandage in context of her proximity to surgery. She initially stated she has had some fevers/chills but was unable to describe the timeline on this, thought it may have been "about three days ago" and did note that she oftentimes feels she comes down with colds and this felt similar. She did not endorse any other red flag symptoms, but given the extent of her drainage/bleeding this close to surgery this writer did encourage her to go upstairs and check in with surgical team out of abundance of caution. We deferred prescription of PROM exercises at home given high pain levels today and significantly increased time spent w/ sling to ensure improved fit, as initially straps are placed incorrectly and pt expresses difficulty donning/doffing. No adverse events, high pain levels throughout but no increase with today's activities. A few minutes after departure pt and her husband came back down and stated they were unable to see surgical team, mentioned they may stop by ED while they were in the building. This Clinical research associate did go over  red flags and symptoms (fevers/chills, SOB, redness/streaking, etc) that would warrant more emergent work up, none of which appeared to be present on exam today. This Clinical research associate also reached out to Careers adviser and ATC via secure chat to notify them of fairly significant bandage saturation and today's evaluation. Pt and spouse departed session in no acute distress,  all questions/concerns addressed appropriately from PT perspective.   OBJECTIVE IMPAIRMENTS: decreased activity tolerance, decreased endurance, decreased ROM, decreased strength, impaired UE functional use, postural dysfunction, and pain.   ACTIVITY LIMITATIONS: carrying, lifting, bending, sitting, standing, sleeping, bathing, toileting, dressing, self feeding, reach over head, hygiene/grooming, and locomotion level  PARTICIPATION LIMITATIONS: meal prep, cleaning, laundry, and community activity  PERSONAL FACTORS: Age, Time since onset of injury/illness/exacerbation, and 3+ comorbidities: anxiety, asthma, cancer, COPD, depression, GERD, headaches, home O2 (pt denies use), hx LE DVT  are also affecting patient's functional outcome.   REHAB POTENTIAL: Fair given comorbidities and high pain levels  CLINICAL DECISION MAKING: Evolving/moderate complexity  EVALUATION COMPLEXITY: Moderate   GOALS:   SHORT TERM GOALS: Target date: 03/09/2024  Pt will demonstrate appropriate understanding and performance of initially prescribed HEP in order to facilitate improved independence with management of symptoms.  Baseline: HEP established  Goal status: INITIAL   2. Pt will report at least 25% improvement in overall pain levels over past week in order to facilitate improved tolerance to typical daily activities.   Baseline: 7-8/10  Goal status: INITIAL    LONG TERM GOALS: Target date: 04/20/2024  Pt will improve at least MDC on Quick DASH in order to indicate reduced levels of disability due to shoulder pain (MDC 16-20pts).  Baseline: QuickDASH TBD    Goal status: INITIAL  2.  Pt will demonstrate at least 140 degrees of active shoulder elevation on surgical limb in order to demonstrate improved tolerance to functional movement patterns such as reaching overhead.  Baseline: see ROM chart above Goal status: INITIAL  3.  Pt will demonstrate at least 4+/5 shoulder flex/abd MMT for improved symmetry of UE strength and improved tolerance to functional movements.  Baseline: MMT deferred on eval given acuity of surgery Goal status: INITIAL  4. Pt will report/demonstrate ability to perform upper body dressing with less than 2 point increase in pain on NPS in order to indicate improved tolerance/independence with ADLs.   Baseline: receiving assist from husband   Goal status: INITIAL   5. Pt will report at least 50% decrease in overall pain levels in past week in order to facilitate improved tolerance to basic ADLs/mobility.   Baseline: 7-8/10  Goal status: INITIAL    PLAN:  PT FREQUENCY: 2x/week  PT DURATION: 12 weeks  PLANNED INTERVENTIONS: 97164- PT Re-evaluation, 97110-Therapeutic exercises, 97530- Therapeutic activity, 97112- Neuromuscular re-education, 97535- Self Care, 62130- Manual therapy, 97016- Vasopneumatic device, Patient/Family education, Balance training, Stair training, Taping, Dry Needling, Joint mobilization, Cryotherapy, and Moist heat  PLAN FOR NEXT SESSION: Review/update HEP PRN. Progress per [facility] rTSA protocol, mindful of biceps tenodesis. Symptom modification strategies as indicated/appropriate.     Dessie Coma, PT 02/10/2024, 12:47 PM

## 2024-02-15 ENCOUNTER — Telehealth: Payer: Self-pay | Admitting: Orthopaedic Surgery

## 2024-02-15 NOTE — Telephone Encounter (Signed)
 Patient called and said she wants you to know that the pain medicine isn't working. She said she need something strong enough. CB#[phone]

## 2024-02-15 NOTE — Telephone Encounter (Signed)
 Lvm advising pt.

## 2024-02-16 ENCOUNTER — Ambulatory Visit (HOSPITAL_BASED_OUTPATIENT_CLINIC_OR_DEPARTMENT_OTHER): Payer: Medicare Other | Admitting: Physical Therapy

## 2024-02-22 ENCOUNTER — Ambulatory Visit (HOSPITAL_BASED_OUTPATIENT_CLINIC_OR_DEPARTMENT_OTHER): Payer: Medicare Other | Admitting: Physical Therapy

## 2024-02-22 ENCOUNTER — Telehealth: Payer: Self-pay | Admitting: Orthopaedic Surgery

## 2024-02-22 NOTE — Telephone Encounter (Signed)
 Patient called and said that they moving her to home health [facility] in [place]. They are waiting for the orders to start it. CB#[phone]

## 2024-02-23 ENCOUNTER — Telehealth (HOSPITAL_BASED_OUTPATIENT_CLINIC_OR_DEPARTMENT_OTHER): Payer: Self-pay | Admitting: Orthopaedic Surgery

## 2024-02-23 ENCOUNTER — Other Ambulatory Visit (HOSPITAL_BASED_OUTPATIENT_CLINIC_OR_DEPARTMENT_OTHER): Payer: Self-pay | Admitting: Orthopaedic Surgery

## 2024-02-23 DIAGNOSIS — M19012 Primary osteoarthritis, left shoulder: Secondary | ICD-10-CM

## 2024-02-23 NOTE — Telephone Encounter (Signed)
 Called and gave verbal ok for this. Will fax over protocol to fax number provided

## 2024-02-23 NOTE — Telephone Encounter (Signed)
 Patient wants to stop PT and do in home PT due to she has a wound and needs in home wound care. Please contact [name] [phone] patient has a wound not related to surgery pressuer injury on buttocks stage 2.

## 2024-02-23 NOTE — Telephone Encounter (Signed)
 Home health order placed.

## 2024-02-25 ENCOUNTER — Encounter (HOSPITAL_BASED_OUTPATIENT_CLINIC_OR_DEPARTMENT_OTHER): Payer: Medicare Other

## 2024-02-28 ENCOUNTER — Telehealth (HOSPITAL_BASED_OUTPATIENT_CLINIC_OR_DEPARTMENT_OTHER): Payer: Self-pay | Admitting: Orthopaedic Surgery

## 2024-02-28 NOTE — Telephone Encounter (Signed)
 [name] what specifics do I need to put on this order for him?

## 2024-02-28 NOTE — Telephone Encounter (Signed)
 [name] from [facility] fax [phone]  needs orders faxed over he is with the patient now.

## 2024-02-29 ENCOUNTER — Encounter (HOSPITAL_BASED_OUTPATIENT_CLINIC_OR_DEPARTMENT_OTHER): Payer: Medicare Other | Admitting: Physical Therapy

## 2024-02-29 NOTE — Telephone Encounter (Signed)
 I faxed the protocol to [phone], there shouldn't be anything left to do with it!

## 2024-03-01 ENCOUNTER — Telehealth: Payer: Self-pay

## 2024-03-01 ENCOUNTER — Other Ambulatory Visit (HOSPITAL_BASED_OUTPATIENT_CLINIC_OR_DEPARTMENT_OTHER): Payer: Self-pay | Admitting: Orthopaedic Surgery

## 2024-03-01 ENCOUNTER — Telehealth (HOSPITAL_BASED_OUTPATIENT_CLINIC_OR_DEPARTMENT_OTHER): Payer: Self-pay | Admitting: Orthopaedic Surgery

## 2024-03-01 ENCOUNTER — Telehealth: Payer: Self-pay | Admitting: Orthopaedic Surgery

## 2024-03-01 DIAGNOSIS — M19012 Primary osteoarthritis, left shoulder: Secondary | ICD-10-CM

## 2024-03-01 NOTE — Telephone Encounter (Signed)
 [facility] in [place] would like a referral for  PT for shoulder surgery protocol to follow.  Fax# [phone], attn: Wyline Beady.  CB# [phone].  Please advise.  Thank you.

## 2024-03-01 NOTE — Telephone Encounter (Signed)
 Refaxed [facility] protocol to [phone]  Attempted CB to Baird Lyons but was provided the main line and unable to connect to individual representative

## 2024-03-01 NOTE — Telephone Encounter (Signed)
 Received "ok" that fax was transmitted

## 2024-03-01 NOTE — Telephone Encounter (Signed)
 Baird Lyons called from [facility] wants a copy of what you faxed over. Her [phone]. CB#[phone]

## 2024-03-01 NOTE — Telephone Encounter (Signed)
 Referral placed and protocol faxed

## 2024-03-01 NOTE — Telephone Encounter (Signed)
 Referral faxed to [phone]

## 2024-03-01 NOTE — Telephone Encounter (Signed)
 Patient states that her PA  Leron Croak at [facility] said he would refill her pain meds that she got from Dr B Dilantin. I explained to patient I not sure if this can be done but these are the fax and phone number for PA,  [phone]  (p) [phone]. Please contact patient [phone]

## 2024-03-02 ENCOUNTER — Telehealth: Payer: Self-pay | Admitting: Orthopaedic Surgery

## 2024-03-02 NOTE — Telephone Encounter (Signed)
 Baird Lyons from [facility] called and she just needed a copy of the outpatient order so they can discharge the patient. FAX#[phone]

## 2024-03-03 ENCOUNTER — Encounter (HOSPITAL_BASED_OUTPATIENT_CLINIC_OR_DEPARTMENT_OTHER): Payer: Medicare Other | Admitting: Physical Therapy

## 2024-03-03 NOTE — Telephone Encounter (Signed)
 Outpatient PT referral faxed to [phone]

## 2024-03-15 ENCOUNTER — Ambulatory Visit (HOSPITAL_BASED_OUTPATIENT_CLINIC_OR_DEPARTMENT_OTHER)

## 2024-03-15 ENCOUNTER — Ambulatory Visit (HOSPITAL_BASED_OUTPATIENT_CLINIC_OR_DEPARTMENT_OTHER): Admitting: Orthopaedic Surgery

## 2024-03-15 DIAGNOSIS — M19012 Primary osteoarthritis, left shoulder: Secondary | ICD-10-CM | POA: Diagnosis not present

## 2024-03-15 NOTE — Progress Notes (Signed)
 Post Operative Evaluation    Procedure/Date of Surgery: Left shoulder reverse shoulder arthroplasty 01/24/24  Interval History:   Presents today 6 weeks status post the above procedure.  At this time she did have an injury where she hit the door frame although she is subsequently feeling better since then   PMH/PSH/Family History/Social History/Meds/Allergies:    Past Medical History:  Diagnosis Date   Anxiety    states frequent  anxiety attacks   Asthma    Cancer (HCC)    HX OF THYROID  CANCER    Cold no drainage   since thursday 07-21-12, chest xray done 07-24-2012    COPD (chronic obstructive pulmonary disease) (HCC)    LOV with PFT 1/13  Dr Aleda Hurl on chart-   pt states no changes since last saw pulmonary   Depression    Fibromyalgia    states has had trigger point injections shoulders-    GERD (gastroesophageal reflux disease)    Headache(784.0)    migraines   History of COVID-19 05/2023   History of DVT of lower extremity 1972   right leg   Hyperlipidemia    Hypothyroidism    OA (osteoarthritis)    On home oxygen therapy    2 liters per minute per [place] as needed   Pneumonia    2008   Seasonal allergies    sgtates clear nasal drainage x 3- days- states no fever   Shortness of breath    oxygen at 2 liters at night, WITH EXERTION    Past Surgical History:  Procedure Laterality Date   ABDOMINAL HYSTERECTOMY  1990   bilateral salpingo-oophorectomy   BARTHOLIN GLAND CYST EXCISION  1977   PRIOR MARSIPULIZATION IN 1974   BOWEL SURGERY FOR OBSTRUCTION  1992   CLOSED MANIPULATION RIGHT KNEE REPLACEMENT  09-10-2010   DIAGNOSTIC LAPAROSCOPY  1989   x2-3 with ovarian cystectomy   FINGER ARTHROPLASTY  07/29/2012   Procedure: FINGER ARTHROPLASTY;  Surgeon: Verlinda Gloss, MD;  Location: [facility];  Service: Orthopedics;  Laterality: Left;  Left Thumb, CMC Interpositional Arthroplasty Left Thumb,with Palmaris Longis Tendon Graft, IPSI  Lateral      HEMORRHOID SURGERY  1988   KNEE ARTHROSCOPY  10-26-2008   AND REMOVAL 2 SCREWS PROXIMAL TIBIA  OF RIGHT KNEE   KNEE ARTHROTOMY  LAST ONE 2000   right x 3 prior to replacement   LEFT SHOULDER ARTHROSCOPY  03-02-2012   REVERSE SHOULDER ARTHROPLASTY Left 01/24/2024   Procedure: LEFT REVERSE SHOULDER ARTHROPLASTY;  Surgeon: Wilhelmenia Harada, MD;  Location: [facility];  Service: Orthopedics;  Laterality: Left;   THYROID  LOBECTOMY  2009   PARTIAL LEFT LOWER LOBECTOMY   TONSILLECTOMY  1959   TOTAL KNEE ARTHROPLASTY  07-31-2010   OA  RIGHT KNEE   TOTAL KNEE ARTHROPLASTY Left 10/03/2020   Procedure: LEFT TOTAL KNEE ARTHROPLASTY;  Surgeon: Arnie Lao, MD;  Location: [facility];  Service: Orthopedics;  Laterality: Left;   Social History   Socioeconomic History   Marital status: Married    Spouse name: Not on file   Number of children: Not on file   Years of education: Not on file   Highest education level: Not on file  Occupational History   Not on file  Tobacco Use   Smoking status: Former    Current  packs/day: 0.00    Average packs/day: 1 pack/day for 43.0 years (43.0 ttl pk-yrs)    Types: Cigarettes    Quit date: 09/23/2021    Years since quitting: 2.4   Smokeless tobacco: Never  Vaping Use   Vaping status: Never Used  Substance and Sexual Activity   Alcohol use: No   Drug use: Yes    Types: Marijuana    Comment: smokes once every 3 weeks   Sexual activity: Not on file  Other Topics Concern   Not on file  Social History Narrative   Not on file   Social Drivers of Health   Financial Resource Strain: Not on file  Food Insecurity: Not on file  Transportation Needs: Not on file  Physical Activity: Not on file  Stress: Not on file  Social Connections: Not on file   No family history on file. Allergies  Allergen Reactions   Codeine Nausea And Vomiting   Doxycycline  Nausea And Vomiting    Pt states she can tolerate this   Penicillins Other (See Comments)     Family cannot take / update 03/02/12 - pt. does not remember the reaction.   Sulfa Antibiotics Nausea And Vomiting   Tylenol  [Acetaminophen ] Other (See Comments)    Cannot take with Neurontin  / update 03/02/12 - causes diarrhea   Vistaril [Hydroxyzine Hcl] Other (See Comments)     Hallucinations   Current Outpatient Medications  Medication Sig Dispense Refill   HYDROcodone -acetaminophen  (NORCO/VICODIN) 5-325 MG tablet Take 1 tablet by mouth every 6 (six) hours as needed for moderate pain (pain score 4-6). 30 tablet 0   albuterol  (PROVENTIL  HFA;VENTOLIN  HFA) 108 (90 BASE) MCG/ACT inhaler Inhale 2 puffs into the lungs every 6 (six) hours as needed for wheezing or shortness of breath.      albuterol  (PROVENTIL ) (2.5 MG/3ML) 0.083% nebulizer solution Take 2.5 mg by nebulization every 6 (six) hours as needed for shortness of breath or wheezing.     Ascorbic Acid  (VITAMIN C ) 1000 MG tablet Take 1,000 mg by mouth daily.     aspirin  325 MG EC tablet TAKE 1 TABLET BY MOUTH TWICE A DAY AFTER MEAL (Patient not taking: Reported on 01/12/2024) 30 tablet 0   atorvastatin  (LIPITOR) 20 MG tablet Take 10 mg by mouth daily before breakfast.      B Complex-C (B-COMPLEX WITH VITAMIN C ) tablet Take 1 tablet by mouth daily.     buPROPion  (WELLBUTRIN  XL) 300 MG 24 hr tablet Take 300 mg by mouth daily.     butalbital -aspirin -caffeine  (FIORINAL) 50-325-40 MG capsule Take 1 capsule by mouth 2 (two) times daily as needed for headache.     cetirizine (ZYRTEC) 10 MG tablet Take 10 mg by mouth daily.     clonazePAM  (KLONOPIN ) 0.5 MG tablet Take 0.25-0.5 mg by mouth in the morning, at noon, and at bedtime.     CREON 36000-114000 units CPEP capsule Take 108,000-144,000 Units by mouth 3 (three) times daily with meals.     escitalopram  (LEXAPRO ) 10 MG tablet Take 10 mg by mouth daily.     estradiol  (ESTRACE ) 0.5 MG tablet Take 0.5 mg by mouth daily.     famotidine (PEPCID) 40 MG tablet Take 40 mg by mouth 2 (two) times daily.      fentaNYL  (DURAGESIC ) 25 MCG/HR Place 1 patch onto the skin every other day.     fluticasone  (FLONASE ) 50 MCG/ACT nasal spray Place 1 spray into both nostrils daily as needed for allergies.  Fluticasone -Umeclidin-Vilant 100-62.5-25 MCG/INH AEPB Inhale 1 puff into the lungs daily.      folic acid (FOLVITE) 800 MCG tablet Take 800 mcg by mouth daily.     gabapentin  (NEURONTIN ) 300 MG capsule Take 600 mg by mouth 2 (two) times daily.     glycopyrrolate  (ROBINUL ) 1 MG tablet Take 1 mg by mouth at bedtime.     HYDROmorphone  (DILAUDID ) 2 MG tablet Take 2 mg by mouth 4 (four) times daily as needed for moderate pain.     HYDROmorphone  (DILAUDID ) 4 MG tablet Take 1 tablet (4 mg total) by mouth every 4 (four) hours as needed for severe pain (pain score 7-10). 30 tablet 0   HYDROmorphone  (DILAUDID ) 4 MG tablet Take 1 tablet (4 mg total) by mouth every 4 (four) hours as needed for severe pain (pain score 7-10) (if oxycodone  not effective). 30 tablet 0   hydroxypropyl methylcellulose / hypromellose (ISOPTO TEARS / GONIOVISC) 2.5 % ophthalmic solution Place 1 drop into both eyes as needed for dry eyes.     levothyroxine  (SYNTHROID , LEVOTHROID) 25 MCG tablet Take 25 mcg by mouth daily before breakfast.     linaclotide (LINZESS) 145 MCG CAPS capsule Take 145 mcg by mouth daily before breakfast.     Multiple Vitamin (MULTIVITAMIN WITH MINERALS) TABS tablet Take 1 tablet by mouth daily.     Multiple Vitamins-Minerals (PRESERVISION AREDS 2 PO) Take 1 capsule by mouth in the morning and at bedtime.     naratriptan (AMERGE) 2.5 MG tablet Take 2.5 mg by mouth as needed for migraine.      NARCAN  4 MG/0.1ML LIQD nasal spray kit Place 0.4 mg into the nose once.      oxyCODONE  (ROXICODONE ) 5 MG immediate release tablet Take 1 tablet (5 mg total) by mouth every 4 (four) hours as needed for severe pain (pain score 7-10). 30 tablet 0   OXYGEN Inhale 3 L into the lungs as needed (shortness of breath).     rOPINIRole   (REQUIP ) 2 MG tablet Take 4 mg by mouth at bedtime.     traZODone (DESYREL) 50 MG tablet Take 100-150 mg by mouth at bedtime.     valACYclovir  (VALTREX ) 1000 MG tablet Take 1,000 mg by mouth 2 (two) times daily as needed (outbreak).      verapamil  (CALAN -SR) 120 MG CR tablet Take 120 mg by mouth daily.     vitamin E 180 MG (400 UNITS) capsule Take 400 Units by mouth daily.     No current facility-administered medications for this visit.   No results found.  Review of Systems:   A ROS was performed including pertinent positives and negatives as documented in the HPI.   Musculoskeletal Exam:    There were no vitals taken for this visit.  Left shoulder incision is well-appearing without erythema or drainage.  Active forward elevation is to 80 degrees.  External rotation at side is to 20 degrees.  Internal rotation deferred today  Imaging:    2 views left shoulder: Status post reverse shoulder arthroplasty without evidence of complication  I personally reviewed and interpreted the radiographs.   Assessment:   6 weeks status post reverse shoulder arthroplasty without evidence of complication.  Active range of motion is improved significantly.  She is now overhead to 130 degrees.  At this time she is continuing to take it somewhat easy after recent injury running into a door.  I will plan to see her back in 6 weeks for reassessment  Plan :    -  Turn to clinic 6 weeks for reassessment      I personally saw and evaluated the patient, and participated in the management and treatment plan.  Wilhelmenia Harada, MD Attending Physician, Orthopedic Surgery  This document was dictated using Dragon voice recognition software. A reasonable attempt at proof reading has been made to minimize errors.

## 2024-04-26 ENCOUNTER — Encounter (HOSPITAL_BASED_OUTPATIENT_CLINIC_OR_DEPARTMENT_OTHER): Admitting: Orthopaedic Surgery

## 2024-05-01 ENCOUNTER — Encounter: Admitting: Orthopedic Surgery

## 2024-05-12 ENCOUNTER — Ambulatory Visit (HOSPITAL_BASED_OUTPATIENT_CLINIC_OR_DEPARTMENT_OTHER): Admitting: Orthopaedic Surgery

## 2024-05-12 ENCOUNTER — Ambulatory Visit (HOSPITAL_BASED_OUTPATIENT_CLINIC_OR_DEPARTMENT_OTHER)

## 2024-05-12 DIAGNOSIS — M19012 Primary osteoarthritis, left shoulder: Secondary | ICD-10-CM

## 2024-05-12 NOTE — Progress Notes (Signed)
 Post Operative Evaluation    Procedure/Date of Surgery: Left shoulder reverse shoulder arthroplasty 01/24/24  Interval History:   Presents today 12 weeks status post the above procedure.  Overall she is doing quite well and range of motion is improved nicely   PMH/PSH/Family History/Social History/Meds/Allergies:    Past Medical History:  Diagnosis Date   Anxiety    states frequent  anxiety attacks   Asthma    Cancer (HCC)    HX OF THYROID  CANCER    Cold no drainage   since thursday 07-21-12, chest xray done 07-24-2012    COPD (chronic obstructive pulmonary disease) (HCC)    LOV with PFT 1/13  Dr Aleda Hurl on chart-   pt states no changes since last saw pulmonary   Depression    Fibromyalgia    states has had trigger point injections shoulders-    GERD (gastroesophageal reflux disease)    Headache(784.0)    migraines   History of COVID-19 05/2023   History of DVT of lower extremity 1972   right leg   Hyperlipidemia    Hypothyroidism    OA (osteoarthritis)    On home oxygen therapy    2 liters per minute per [place] as needed   Pneumonia    2008   Seasonal allergies    sgtates clear nasal drainage x 3- days- states no fever   Shortness of breath    oxygen at 2 liters at night, WITH EXERTION    Past Surgical History:  Procedure Laterality Date   ABDOMINAL HYSTERECTOMY  1990   bilateral salpingo-oophorectomy   BARTHOLIN GLAND CYST EXCISION  1977   PRIOR MARSIPULIZATION IN 1974   BOWEL SURGERY FOR OBSTRUCTION  1992   CLOSED MANIPULATION RIGHT KNEE REPLACEMENT  09-10-2010   DIAGNOSTIC LAPAROSCOPY  1989   x2-3 with ovarian cystectomy   FINGER ARTHROPLASTY  07/29/2012   Procedure: FINGER ARTHROPLASTY;  Surgeon: Verlinda Gloss, MD;  Location: [facility];  Service: Orthopedics;  Laterality: Left;  Left Thumb, CMC Interpositional Arthroplasty Left Thumb,with Palmaris Longis Tendon Graft, IPSI Lateral      HEMORRHOID SURGERY  1988   KNEE  ARTHROSCOPY  10-26-2008   AND REMOVAL 2 SCREWS PROXIMAL TIBIA  OF RIGHT KNEE   KNEE ARTHROTOMY  LAST ONE 2000   right x 3 prior to replacement   LEFT SHOULDER ARTHROSCOPY  03-02-2012   REVERSE SHOULDER ARTHROPLASTY Left 01/24/2024   Procedure: LEFT REVERSE SHOULDER ARTHROPLASTY;  Surgeon: Wilhelmenia Harada, MD;  Location: [facility];  Service: Orthopedics;  Laterality: Left;   THYROID  LOBECTOMY  2009   PARTIAL LEFT LOWER LOBECTOMY   TONSILLECTOMY  1959   TOTAL KNEE ARTHROPLASTY  07-31-2010   OA  RIGHT KNEE   TOTAL KNEE ARTHROPLASTY Left 10/03/2020   Procedure: LEFT TOTAL KNEE ARTHROPLASTY;  Surgeon: Arnie Lao, MD;  Location: [facility];  Service: Orthopedics;  Laterality: Left;   Social History   Socioeconomic History   Marital status: Married    Spouse name: Not on file   Number of children: Not on file   Years of education: Not on file   Highest education level: Not on file  Occupational History   Not on file  Tobacco Use   Smoking status: Former    Current packs/day: 0.00    Average packs/day: 1 pack/day  for 43.0 years (43.0 ttl pk-yrs)    Types: Cigarettes    Quit date: 09/23/2021    Years since quitting: 2.6   Smokeless tobacco: Never  Vaping Use   Vaping status: Never Used  Substance and Sexual Activity   Alcohol use: No   Drug use: Yes    Types: Marijuana    Comment: smokes once every 3 weeks   Sexual activity: Not on file  Other Topics Concern   Not on file  Social History Narrative   Not on file   Social Drivers of Health   Financial Resource Strain: Not on file  Food Insecurity: Not on file  Transportation Needs: Not on file  Physical Activity: Not on file  Stress: Not on file  Social Connections: Not on file   No family history on file. Allergies  Allergen Reactions   Codeine Nausea And Vomiting   Doxycycline  Nausea And Vomiting    Pt states she can tolerate this   Penicillins Other (See Comments)    Family cannot take / update 03/02/12 - pt.  does not remember the reaction.   Sulfa Antibiotics Nausea And Vomiting   Tylenol  [Acetaminophen ] Other (See Comments)    Cannot take with Neurontin  / update 03/02/12 - causes diarrhea   Vistaril [Hydroxyzine Hcl] Other (See Comments)     Hallucinations   Current Outpatient Medications  Medication Sig Dispense Refill   HYDROcodone -acetaminophen  (NORCO/VICODIN) 5-325 MG tablet Take 1 tablet by mouth every 6 (six) hours as needed for moderate pain (pain score 4-6). 30 tablet 0   albuterol  (PROVENTIL  HFA;VENTOLIN  HFA) 108 (90 BASE) MCG/ACT inhaler Inhale 2 puffs into the lungs every 6 (six) hours as needed for wheezing or shortness of breath.      albuterol  (PROVENTIL ) (2.5 MG/3ML) 0.083% nebulizer solution Take 2.5 mg by nebulization every 6 (six) hours as needed for shortness of breath or wheezing.     Ascorbic Acid  (VITAMIN C ) 1000 MG tablet Take 1,000 mg by mouth daily.     aspirin  325 MG EC tablet TAKE 1 TABLET BY MOUTH TWICE A DAY AFTER MEAL (Patient not taking: Reported on 01/12/2024) 30 tablet 0   atorvastatin  (LIPITOR) 20 MG tablet Take 10 mg by mouth daily before breakfast.      B Complex-C (B-COMPLEX WITH VITAMIN C ) tablet Take 1 tablet by mouth daily.     buPROPion  (WELLBUTRIN  XL) 300 MG 24 hr tablet Take 300 mg by mouth daily.     butalbital -aspirin -caffeine  (FIORINAL) 50-325-40 MG capsule Take 1 capsule by mouth 2 (two) times daily as needed for headache.     cetirizine (ZYRTEC) 10 MG tablet Take 10 mg by mouth daily.     clonazePAM  (KLONOPIN ) 0.5 MG tablet Take 0.25-0.5 mg by mouth in the morning, at noon, and at bedtime.     CREON 36000-114000 units CPEP capsule Take 108,000-144,000 Units by mouth 3 (three) times daily with meals.     escitalopram  (LEXAPRO ) 10 MG tablet Take 10 mg by mouth daily.     estradiol  (ESTRACE ) 0.5 MG tablet Take 0.5 mg by mouth daily.     famotidine (PEPCID) 40 MG tablet Take 40 mg by mouth 2 (two) times daily.     fentaNYL  (DURAGESIC ) 25 MCG/HR Place 1  patch onto the skin every other day.     fluticasone  (FLONASE ) 50 MCG/ACT nasal spray Place 1 spray into both nostrils daily as needed for allergies.      Fluticasone -Umeclidin-Vilant 100-62.5-25 MCG/INH AEPB Inhale 1 puff into  the lungs daily.      folic acid (FOLVITE) 800 MCG tablet Take 800 mcg by mouth daily.     gabapentin  (NEURONTIN ) 300 MG capsule Take 600 mg by mouth 2 (two) times daily.     glycopyrrolate  (ROBINUL ) 1 MG tablet Take 1 mg by mouth at bedtime.     HYDROmorphone  (DILAUDID ) 2 MG tablet Take 2 mg by mouth 4 (four) times daily as needed for moderate pain.     HYDROmorphone  (DILAUDID ) 4 MG tablet Take 1 tablet (4 mg total) by mouth every 4 (four) hours as needed for severe pain (pain score 7-10). 30 tablet 0   HYDROmorphone  (DILAUDID ) 4 MG tablet Take 1 tablet (4 mg total) by mouth every 4 (four) hours as needed for severe pain (pain score 7-10) (if oxycodone  not effective). 30 tablet 0   hydroxypropyl methylcellulose / hypromellose (ISOPTO TEARS / GONIOVISC) 2.5 % ophthalmic solution Place 1 drop into both eyes as needed for dry eyes.     levothyroxine  (SYNTHROID , LEVOTHROID) 25 MCG tablet Take 25 mcg by mouth daily before breakfast.     linaclotide (LINZESS) 145 MCG CAPS capsule Take 145 mcg by mouth daily before breakfast.     Multiple Vitamin (MULTIVITAMIN WITH MINERALS) TABS tablet Take 1 tablet by mouth daily.     Multiple Vitamins-Minerals (PRESERVISION AREDS 2 PO) Take 1 capsule by mouth in the morning and at bedtime.     naratriptan (AMERGE) 2.5 MG tablet Take 2.5 mg by mouth as needed for migraine.      NARCAN  4 MG/0.1ML LIQD nasal spray kit Place 0.4 mg into the nose once.      oxyCODONE  (ROXICODONE ) 5 MG immediate release tablet Take 1 tablet (5 mg total) by mouth every 4 (four) hours as needed for severe pain (pain score 7-10). 30 tablet 0   OXYGEN Inhale 3 L into the lungs as needed (shortness of breath).     rOPINIRole  (REQUIP ) 2 MG tablet Take 4 mg by mouth at  bedtime.     traZODone (DESYREL) 50 MG tablet Take 100-150 mg by mouth at bedtime.     valACYclovir  (VALTREX ) 1000 MG tablet Take 1,000 mg by mouth 2 (two) times daily as needed (outbreak).      verapamil  (CALAN -SR) 120 MG CR tablet Take 120 mg by mouth daily.     vitamin E 180 MG (400 UNITS) capsule Take 400 Units by mouth daily.     No current facility-administered medications for this visit.   No results found.  Review of Systems:   A ROS was performed including pertinent positives and negatives as documented in the HPI.   Musculoskeletal Exam:    There were no vitals taken for this visit.  Left shoulder incision is well-appearing without erythema or drainage.  Active forward elevation is to 80 degrees.  External rotation at side is to 20 degrees.  Internal rotation deferred today  Imaging:    2 views left shoulder: Status post reverse shoulder arthroplasty without evidence of complication  I personally reviewed and interpreted the radiographs.   Assessment:   12 weeks status post reverse shoulder arthroplasty without evidence of complication.  Overall her range of motion is improving nicely.  I will plan to see her back in 3 months for reassessment Plan :    -Turn to clinic 12 weeks for reassessment      I personally saw and evaluated the patient, and participated in the management and treatment plan.  Wilhelmenia Harada, MD Attending  Physician, Orthopedic Surgery  This document was dictated using Dragon voice recognition software. A reasonable attempt at proof reading has been made to minimize errors.

## 2024-06-12 ENCOUNTER — Encounter: Admitting: Orthopedic Surgery

## 2024-07-17 ENCOUNTER — Ambulatory Visit (INDEPENDENT_AMBULATORY_CARE_PROVIDER_SITE_OTHER): Admitting: Orthopedic Surgery

## 2024-07-17 ENCOUNTER — Other Ambulatory Visit (INDEPENDENT_AMBULATORY_CARE_PROVIDER_SITE_OTHER): Payer: Self-pay

## 2024-07-17 ENCOUNTER — Encounter: Admitting: Orthopedic Surgery

## 2024-07-17 DIAGNOSIS — M25531 Pain in right wrist: Secondary | ICD-10-CM

## 2024-07-17 DIAGNOSIS — M79645 Pain in left finger(s): Secondary | ICD-10-CM | POA: Diagnosis not present

## 2024-07-17 NOTE — Progress Notes (Signed)
 Kristin Slatten - 73 y.o. female MRN [id number]  Date of birth: 01/16/1951  Office Visit Note: Visit Date: 07/17/2024 PCP: Vernon Velna SAUNDERS, MD Referred by: Genelle Standing, MD  Subjective: No chief complaint on file.  HPI: Linh Rumberger is a pleasant 73 y.o. female who presents today for specific hand surgery consultation regarding prior left thumb CMC arthroplasty with ongoing pain and dysfunction.  Surgery was performed in 2013, palmaris longus graft was utilized at that time per the operative note.  He is having ongoing pain at the thumb [facility] region with notable weakness and discomfort with pinch and grip activities.  There is also ongoing right hand and wrist soreness.  Pertinent ROS were reviewed with the patient and found to be negative unless otherwise specified above in HPI.   Visit Reason: Prior left thumb [facility] arthroplasty-Doesn't have full motion Duration of symptoms:pain since 2013 Hand dominance: right Occupation: Retired  Diabetic: No Smoking: No-off and on Heart/Lung History:none Blood Thinners: aspirin  on occasion   Prior Testing/EMG:none Injections (Date):none Treatments:Takes dilaudid  as needed, bracing Prior Surgery: Left thumb CMC arthroplasty on 07/29/12 by [place]  Assessment & Plan: Visit Diagnoses:  1. Pain of left thumb   2. 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  X-rays were reviewed in detail today which do show significant subsidence change at the left thumb [facility] articulation.  I did explain that subsidence often occurs without any significant symptoms, however she is developing significant dysfunction of the left thumb with associated MP hyperextension as well to compensate.    We discussed treatment modalities ranging from conservative to surgical.  From a conservative standpoint we discussed bracing, injections,  anti-inflammatory medications and activity modification.  From a surgical standpoint we discussed revision CMC arthroplasty, risks and benefits as well as the postoperative protocol.  At this juncture, given the severity of symptoms that have remained refractory to conservative care, patient would like to consider moving forward with surgical intervention.  She is still recovering from her recent left reverse total shoulder arthroplasty.  She would like to wait until this is fully recovered to move forward with hand surgery.  Risks and benefits of the procedure were discussed, risks including but not limited to infection, bleeding, scarring, stiffness, nerve injury, tendon injury, vascular injury, hardware complication, recurrence of symptoms and need for subsequent operation.  We also discussed the specifics of the postoperative protocol and the appropriate timeline.  Patient expressed understanding.  In the meantime, she did inquire about the potential for a injection to the thumb CMC interval for symptom relief.  Risk and benefits of the injection were discussed in detail, patient elected to proceed.  Injection was performed today and tolerated well.  She will follow-up in approximate 3 months.  I spent 45 minutes in the care of this patient today including review of previous documentation, imaging obtained, face-to-face time discussing all options regarding treatment and documenting the encounter.    Follow-up: No follow-ups on file.   Meds & Orders: No orders of the defined types were placed in this encounter.   Orders Placed This Encounter  Procedures   XR Wrist Complete Left   XR Wrist Complete Right     Procedures: No procedures performed      Clinical History: No specialty comments available.  She reports that she quit smoking about 2 years ago. Her smoking  use included cigarettes. She has a 43 pack-year smoking history. She has never used smokeless tobacco. No results for  input(s): HGBA1C, LABURIC in the last 8760 hours.  Objective:   Vital Signs: There were no vitals taken for this visit.  Physical Exam  Gen: Well-appearing, in no acute distress; non-toxic CV: Regular Rate. Well-perfused. Warm.  Resp: Breathing unlabored on room air; no wheezing. Psych: Fluid speech in conversation; appropriate affect; normal thought process  Ortho Exam General: Patient is well appearing and in no distress.    Skin and Muscle: Prior well-healed incision at the thumb CMC region of the left wrist      Range of Motion and Palpation Tests: Mobility is full about the elbows with flexion and extension.  Forearm supination and pronation are 85/85 bilaterally.  Wrist flexion/extension is 75/65 bilaterally.  Digital flexion and extension are full.     No cords or nodules are palpated.  No triggering is observed.     Significant tenderness over the left thumb CMC articulation is observed, positive grind for pain, positive crepitus.  MP hyperextension positive left, approximately 20 degrees.    Finklestein test mildly positive left   Neurologic, Vascular, Motor: Sensation is intact to light touch in the median/radial/ulnar distributions.    Fingers pink and well perfused.  Capillary refill is brisk.     Imaging: XR Wrist Complete Right Result Date: 07/17/2024 There is evidence of degenerative change at the thumb [facility] interval with joint subluxation and osteophyte formation.  Degenerative changes are also seen at the distal radioulnar joint interval.  XR Wrist Complete Left Result Date: 07/17/2024 X-rays of the left thumb demonstrate notable subsidence of the thumb metacarpal in the setting of prior partial trapeziectomy.  Degenerative changes are seen at the STT interval as well as the DRUJ as well.   Past Medical/Family/Surgical/Social History: Medications & Allergies reviewed per EMR, new medications updated. Patient Active Problem List   Diagnosis Date Noted    Primary osteoarthritis, left shoulder 01/24/2024   Hyperlipidemia 12/31/2021   Atypical chest pain 12/31/2021   Tobacco abuse 12/31/2021   Status post total left knee replacement 10/04/2020   Unilateral primary osteoarthritis, left knee 10/03/2020   Status post total knee replacement, left 10/03/2020   Constipation 09/25/2020   Exocrine pancreatic insufficiency 09/25/2020   Unilateral primary osteoarthritis, left hip 08/22/2020   Chronic pain of left knee 08/22/2020   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   Epigastric pain 03/08/2020   Impingement syndrome of both shoulders 08/04/2018   Chronic tension-type headache, not intractable 12/02/2017   Bilateral elbow joint pain 08/11/2017   Chronic pain of both shoulders 08/11/2017   Chronic fatigue syndrome with fibromyalgia 05/29/2015   Back muscle spasm 04/05/2015   Chronic migraine without aura without status migrainosus, not intractable 07/01/2013   Depression 05/08/2013   Lumbar facet arthropathy 04/13/2013   Chronic, continuous use of opioids 01/12/2013   DDD (degenerative disc disease), cervical 01/12/2013   Raynaud's disease 01/12/2013   Right knee pain 01/12/2013   Scoliosis 01/12/2013   Lesion of lip 05/30/2012   Dysphonia 09/28/2011   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   Past Medical History:  Diagnosis Date   Anxiety    states frequent  anxiety attacks   Asthma    Cancer (HCC)    HX OF THYROID  CANCER    Cold no drainage   since thursday 07-21-12, chest xray done 07-24-2012    COPD (chronic obstructive pulmonary disease) (HCC)    LOV  with PFT 1/13  Dr Elsie Sauers on chart-   pt states no changes since last saw pulmonary   Depression    Fibromyalgia    states has had trigger point injections shoulders-    GERD (gastroesophageal reflux disease)    Headache(784.0)    migraines   History of COVID-19 05/2023   History of DVT of lower extremity 1972   right leg   Hyperlipidemia     Hypothyroidism    OA (osteoarthritis)    On home oxygen therapy    2 liters per minute per [place] as needed   Pneumonia    2008   Seasonal allergies    sgtates clear nasal drainage x 3- days- states no fever   Shortness of breath    oxygen at 2 liters at night, WITH EXERTION    No family history on file. Past Surgical History:  Procedure Laterality Date   ABDOMINAL HYSTERECTOMY  1990   bilateral salpingo-oophorectomy   BARTHOLIN GLAND CYST EXCISION  1977   PRIOR MARSIPULIZATION IN 1974   BOWEL SURGERY FOR OBSTRUCTION  1992   CLOSED MANIPULATION RIGHT KNEE REPLACEMENT  09-10-2010   DIAGNOSTIC LAPAROSCOPY  1989   x2-3 with ovarian cystectomy   FINGER ARTHROPLASTY  07/29/2012   Procedure: FINGER ARTHROPLASTY;  Surgeon: Lynwood SHAUNNA Bern, MD;  Location: [facility];  Service: Orthopedics;  Laterality: Left;  Left Thumb, CMC Interpositional Arthroplasty Left Thumb,with Palmaris Longis Tendon Graft, IPSI Lateral      HEMORRHOID SURGERY  1988   KNEE ARTHROSCOPY  10-26-2008   AND REMOVAL 2 SCREWS PROXIMAL TIBIA  OF RIGHT KNEE   KNEE ARTHROTOMY  LAST ONE 2000   right x 3 prior to replacement   LEFT SHOULDER ARTHROSCOPY  03-02-2012   REVERSE SHOULDER ARTHROPLASTY Left 01/24/2024   Procedure: LEFT REVERSE SHOULDER ARTHROPLASTY;  Surgeon: Genelle Standing, MD;  Location: [facility];  Service: Orthopedics;  Laterality: Left;   THYROID  LOBECTOMY  2009   PARTIAL LEFT LOWER LOBECTOMY   TONSILLECTOMY  1959   TOTAL KNEE ARTHROPLASTY  07-31-2010   OA  RIGHT KNEE   TOTAL KNEE ARTHROPLASTY Left 10/03/2020   Procedure: LEFT TOTAL KNEE ARTHROPLASTY;  Surgeon: Vernetta Lonni GRADE, MD;  Location: [facility];  Service: Orthopedics;  Laterality: Left;   Social History   Occupational History   Not on file  Tobacco Use   Smoking status: Former    Current packs/day: 0.00    Average packs/day: 1 pack/day for 43.0 years (43.0 ttl pk-yrs)    Types: Cigarettes    Quit date: 09/23/2021    Years since quitting: 2.8    Smokeless tobacco: Never  Vaping Use   Vaping status: Never Used  Substance and Sexual Activity   Alcohol use: No   Drug use: Yes    Types: Marijuana    Comment: smokes once every 3 weeks   Sexual activity: Not on file    Marwah Disbro Estela) Arlinda, M.D. [facility], Hand Surgery

## 2024-08-09 ENCOUNTER — Ambulatory Visit (INDEPENDENT_AMBULATORY_CARE_PROVIDER_SITE_OTHER)

## 2024-08-09 ENCOUNTER — Ambulatory Visit (HOSPITAL_BASED_OUTPATIENT_CLINIC_OR_DEPARTMENT_OTHER)

## 2024-08-09 ENCOUNTER — Ambulatory Visit (HOSPITAL_BASED_OUTPATIENT_CLINIC_OR_DEPARTMENT_OTHER): Admitting: Orthopaedic Surgery

## 2024-08-09 DIAGNOSIS — M79601 Pain in right arm: Secondary | ICD-10-CM

## 2024-08-09 DIAGNOSIS — M19012 Primary osteoarthritis, left shoulder: Secondary | ICD-10-CM | POA: Diagnosis not present

## 2024-08-09 NOTE — Progress Notes (Signed)
 Post Operative Evaluation    Procedure/Date of Surgery: Left shoulder reverse shoulder arthroplasty 01/24/24  Interval History:   Presents today after a fall directly onto the right side.  She is here today for further discussion.  Her left shoulder has retained good range of motion   PMH/PSH/Family History/Social History/Meds/Allergies:    Past Medical History:  Diagnosis Date   Anxiety    states frequent  anxiety attacks   Asthma    Cancer (HCC)    HX OF THYROID  CANCER    Cold no drainage   since thursday 07-21-12, chest xray done 07-24-2012    COPD (chronic obstructive pulmonary disease) (HCC)    LOV with PFT 1/13  Dr Elsie Sauers on chart-   pt states no changes since last saw pulmonary   Depression    Fibromyalgia    states has had trigger point injections shoulders-    GERD (gastroesophageal reflux disease)    Headache(784.0)    migraines   History of COVID-19 05/2023   History of DVT of lower extremity 1972   right leg   Hyperlipidemia    Hypothyroidism    OA (osteoarthritis)    On home oxygen therapy    2 liters per minute per [place] as needed   Pneumonia    2008   Seasonal allergies    sgtates clear nasal drainage x 3- days- states no fever   Shortness of breath    oxygen at 2 liters at night, WITH EXERTION    Past Surgical History:  Procedure Laterality Date   ABDOMINAL HYSTERECTOMY  1990   bilateral salpingo-oophorectomy   BARTHOLIN GLAND CYST EXCISION  1977   PRIOR MARSIPULIZATION IN 1974   BOWEL SURGERY FOR OBSTRUCTION  1992   CLOSED MANIPULATION RIGHT KNEE REPLACEMENT  09-10-2010   DIAGNOSTIC LAPAROSCOPY  1989   x2-3 with ovarian cystectomy   FINGER ARTHROPLASTY  07/29/2012   Procedure: FINGER ARTHROPLASTY;  Surgeon: Lynwood SHAUNNA Bern, MD;  Location: [facility];  Service: Orthopedics;  Laterality: Left;  Left Thumb, CMC Interpositional Arthroplasty Left Thumb,with Palmaris Longis Tendon Graft, IPSI Lateral       HEMORRHOID SURGERY  1988   KNEE ARTHROSCOPY  10-26-2008   AND REMOVAL 2 SCREWS PROXIMAL TIBIA  OF RIGHT KNEE   KNEE ARTHROTOMY  LAST ONE 2000   right x 3 prior to replacement   LEFT SHOULDER ARTHROSCOPY  03-02-2012   REVERSE SHOULDER ARTHROPLASTY Left 01/24/2024   Procedure: LEFT REVERSE SHOULDER ARTHROPLASTY;  Surgeon: Genelle Standing, MD;  Location: [facility];  Service: Orthopedics;  Laterality: Left;   THYROID  LOBECTOMY  2009   PARTIAL LEFT LOWER LOBECTOMY   TONSILLECTOMY  1959   TOTAL KNEE ARTHROPLASTY  07-31-2010   OA  RIGHT KNEE   TOTAL KNEE ARTHROPLASTY Left 10/03/2020   Procedure: LEFT TOTAL KNEE ARTHROPLASTY;  Surgeon: Vernetta Lonni GRADE, MD;  Location: [facility];  Service: Orthopedics;  Laterality: Left;   Social History   Socioeconomic History   Marital status: Married    Spouse name: Not on file   Number of children: Not on file   Years of education: Not on file   Highest education level: Not on file  Occupational History   Not on file  Tobacco Use   Smoking status: Former    Current packs/day: 0.00  Average packs/day: 1 pack/day for 43.0 years (43.0 ttl pk-yrs)    Types: Cigarettes    Quit date: 09/23/2021    Years since quitting: 2.8   Smokeless tobacco: Never  Vaping Use   Vaping status: Never Used  Substance and Sexual Activity   Alcohol use: No   Drug use: Yes    Types: Marijuana    Comment: smokes once every 3 weeks   Sexual activity: Not on file  Other Topics Concern   Not on file  Social History Narrative   Not on file   Social Drivers of Health   Financial Resource Strain: Not on file  Food Insecurity: Not on file  Transportation Needs: Not on file  Physical Activity: Not on file  Stress: Not on file  Social Connections: Not on file   No family history on file. Allergies  Allergen Reactions   Codeine Nausea And Vomiting   Doxycycline  Nausea And Vomiting    Pt states she can tolerate this   Penicillins Other (See Comments)    Family  cannot take / update 03/02/12 - pt. does not remember the reaction.   Sulfa Antibiotics Nausea And Vomiting   Tylenol  [Acetaminophen ] Other (See Comments)    Cannot take with Neurontin  / update 03/02/12 - causes diarrhea   Vistaril [Hydroxyzine Hcl] Other (See Comments)     Hallucinations   Current Outpatient Medications  Medication Sig Dispense Refill   HYDROcodone -acetaminophen  (NORCO/VICODIN) 5-325 MG tablet Take 1 tablet by mouth every 6 (six) hours as needed for moderate pain (pain score 4-6). 30 tablet 0   albuterol  (PROVENTIL  HFA;VENTOLIN  HFA) 108 (90 BASE) MCG/ACT inhaler Inhale 2 puffs into the lungs every 6 (six) hours as needed for wheezing or shortness of breath.      albuterol  (PROVENTIL ) (2.5 MG/3ML) 0.083% nebulizer solution Take 2.5 mg by nebulization every 6 (six) hours as needed for shortness of breath or wheezing.     Ascorbic Acid  (VITAMIN C ) 1000 MG tablet Take 1,000 mg by mouth daily.     aspirin  325 MG EC tablet TAKE 1 TABLET BY MOUTH TWICE A DAY AFTER MEAL (Patient not taking: Reported on 01/12/2024) 30 tablet 0   atorvastatin  (LIPITOR) 20 MG tablet Take 10 mg by mouth daily before breakfast.      B Complex-C (B-COMPLEX WITH VITAMIN C ) tablet Take 1 tablet by mouth daily.     buPROPion  (WELLBUTRIN  XL) 300 MG 24 hr tablet Take 300 mg by mouth daily.     butalbital -aspirin -caffeine  (FIORINAL) 50-325-40 MG capsule Take 1 capsule by mouth 2 (two) times daily as needed for headache.     cetirizine (ZYRTEC) 10 MG tablet Take 10 mg by mouth daily.     clonazePAM  (KLONOPIN ) 0.5 MG tablet Take 0.25-0.5 mg by mouth in the morning, at noon, and at bedtime.     CREON 36000-114000 units CPEP capsule Take 108,000-144,000 Units by mouth 3 (three) times daily with meals.     escitalopram  (LEXAPRO ) 10 MG tablet Take 10 mg by mouth daily.     estradiol  (ESTRACE ) 0.5 MG tablet Take 0.5 mg by mouth daily.     famotidine (PEPCID) 40 MG tablet Take 40 mg by mouth 2 (two) times daily.      fentaNYL  (DURAGESIC ) 25 MCG/HR Place 1 patch onto the skin every other day.     fluticasone  (FLONASE ) 50 MCG/ACT nasal spray Place 1 spray into both nostrils daily as needed for allergies.      Fluticasone -Umeclidin-Vilant 100-62.5-25 MCG/INH AEPB  Inhale 1 puff into the lungs daily.      folic acid (FOLVITE) 800 MCG tablet Take 800 mcg by mouth daily.     gabapentin  (NEURONTIN ) 300 MG capsule Take 600 mg by mouth 2 (two) times daily.     glycopyrrolate  (ROBINUL ) 1 MG tablet Take 1 mg by mouth at bedtime.     HYDROmorphone  (DILAUDID ) 2 MG tablet Take 2 mg by mouth 4 (four) times daily as needed for moderate pain.     HYDROmorphone  (DILAUDID ) 4 MG tablet Take 1 tablet (4 mg total) by mouth every 4 (four) hours as needed for severe pain (pain score 7-10). 30 tablet 0   HYDROmorphone  (DILAUDID ) 4 MG tablet Take 1 tablet (4 mg total) by mouth every 4 (four) hours as needed for severe pain (pain score 7-10) (if oxycodone  not effective). 30 tablet 0   hydroxypropyl methylcellulose / hypromellose (ISOPTO TEARS / GONIOVISC) 2.5 % ophthalmic solution Place 1 drop into both eyes as needed for dry eyes.     levothyroxine  (SYNTHROID , LEVOTHROID) 25 MCG tablet Take 25 mcg by mouth daily before breakfast.     linaclotide (LINZESS) 145 MCG CAPS capsule Take 145 mcg by mouth daily before breakfast.     Multiple Vitamin (MULTIVITAMIN WITH MINERALS) TABS tablet Take 1 tablet by mouth daily.     Multiple Vitamins-Minerals (PRESERVISION AREDS 2 PO) Take 1 capsule by mouth in the morning and at bedtime.     naratriptan (AMERGE) 2.5 MG tablet Take 2.5 mg by mouth as needed for migraine.      NARCAN  4 MG/0.1ML LIQD nasal spray kit Place 0.4 mg into the nose once.      oxyCODONE  (ROXICODONE ) 5 MG immediate release tablet Take 1 tablet (5 mg total) by mouth every 4 (four) hours as needed for severe pain (pain score 7-10). 30 tablet 0   OXYGEN Inhale 3 L into the lungs as needed (shortness of breath).     rOPINIRole  (REQUIP )  2 MG tablet Take 4 mg by mouth at bedtime.     traZODone (DESYREL) 50 MG tablet Take 100-150 mg by mouth at bedtime.     valACYclovir  (VALTREX ) 1000 MG tablet Take 1,000 mg by mouth 2 (two) times daily as needed (outbreak).      verapamil  (CALAN -SR) 120 MG CR tablet Take 120 mg by mouth daily.     vitamin E 180 MG (400 UNITS) capsule Take 400 Units by mouth daily.     No current facility-administered medications for this visit.   No results found.  Review of Systems:   A ROS was performed including pertinent positives and negatives as documented in the HPI.   Musculoskeletal Exam:    There were no vitals taken for this visit.  Left shoulder incision is well-appearing without erythema or drainage.  Active forward elevation is to 80 degrees.  External rotation at side is to 20 degrees.  Internal rotation deferred today  Imaging:    2 views left shoulder: Status post reverse shoulder arthroplasty without evidence of complication  I personally reviewed and interpreted the radiographs.   Assessment:   Status post reverse shoulder arthroplasty without evidence of complication.  Overall her range of motion is improving nicely.  With regard to the right shoulder I did discuss that she does still have persistent osteoarthritis in the right shoulder.  She will return to clinic in 12 months following the resolution of her right shoulder bone bruise and we will talk about possible arthroplasty at that  time Plan :    -Turn to clinic 12 weeks for reassessment      I personally saw and evaluated the patient, and participated in the management and treatment plan.  Elspeth Parker, MD Attending Physician, Orthopedic Surgery  This document was dictated using Dragon voice recognition software. A reasonable attempt at proof reading has been made to minimize errors.

## 2024-08-29 ENCOUNTER — Telehealth: Payer: Self-pay | Admitting: Orthopaedic Surgery

## 2024-08-30 ENCOUNTER — Other Ambulatory Visit (HOSPITAL_BASED_OUTPATIENT_CLINIC_OR_DEPARTMENT_OTHER): Payer: Self-pay | Admitting: Orthopaedic Surgery

## 2024-08-30 ENCOUNTER — Telehealth: Payer: Self-pay | Admitting: Orthopaedic Surgery

## 2024-08-30 DIAGNOSIS — M12811 Other specific arthropathies, not elsewhere classified, right shoulder: Secondary | ICD-10-CM

## 2024-08-30 NOTE — Telephone Encounter (Signed)
 Surgery sheet sent

## 2024-08-30 NOTE — Telephone Encounter (Signed)
 Patient left a message stating she does not want to wait until her follow up appointment to discuss scheduling her right RSA. Patient states her right shoulder throbs all the time and she can hardly move her arm at all. Please call to advise.

## 2024-09-06 ENCOUNTER — Telehealth: Payer: Self-pay | Admitting: Orthopaedic Surgery

## 2024-09-06 NOTE — Telephone Encounter (Signed)
 Patient left a message that she understands she needs CT scan of her shoulder before she can have surgery. She was not sure insurance would cover her having two surgeries in the same year, so she did not schedule the CT scan when [facility] called her. She knows now and will not cancel CT. If you would, please have GI call patient again to schedule, or call her with GI scheduling phone number.

## 2024-09-07 NOTE — Telephone Encounter (Signed)
 Secure chat messaged Synthieia M with [facility] to schedule

## 2024-09-08 ENCOUNTER — Telehealth: Payer: Self-pay | Admitting: Orthopaedic Surgery

## 2024-09-08 NOTE — Telephone Encounter (Signed)
 I spoke with the patient today and scheduled her for right shoulder surgery on 10/17/24. Patient is requesting a call from doctor to discuss her pain. Per patient Dilaudid  4 mg is not strong enough for her level of pain. Patient is hoping doctor will up dosage to 6 mg instead. Please call to discuss.

## 2024-09-11 ENCOUNTER — Ambulatory Visit
Admission: RE | Admit: 2024-09-11 | Discharge: 2024-09-11 | Disposition: A | Discharge status: Home / Self Care | Source: Ambulatory Visit | Attending: Orthopaedic Surgery | Admitting: Orthopaedic Surgery

## 2024-09-11 DIAGNOSIS — M12811 Other specific arthropathies, not elsewhere classified, right shoulder: Secondary | ICD-10-CM

## 2024-09-18 ENCOUNTER — Other Ambulatory Visit: Payer: Self-pay | Admitting: Medical Genetics

## 2024-09-18 DIAGNOSIS — Z006 Encounter for examination for normal comparison and control in clinical research program: Secondary | ICD-10-CM

## 2024-09-25 ENCOUNTER — Encounter: Payer: Self-pay | Admitting: Radiology

## 2024-10-10 NOTE — Progress Notes (Signed)
 Patient moved to Main OR due to wearing O2 at 3L/[place] nightly and being an ASA 4 on 01-18-24 for left Reverse shoulder at [facility].

## 2024-10-11 ENCOUNTER — Ambulatory Visit (INDEPENDENT_AMBULATORY_CARE_PROVIDER_SITE_OTHER): Admitting: Orthopaedic Surgery

## 2024-10-11 DIAGNOSIS — M19012 Primary osteoarthritis, left shoulder: Secondary | ICD-10-CM

## 2024-10-11 NOTE — Progress Notes (Signed)
 Post Operative Evaluation    Procedure/Date of Surgery: Left shoulder reverse shoulder arthroplasty 01/24/24  Interval History:    Presents today for follow-up of her left shoulder.  Overall she is doing very well.  Range of motion is excellent   PMH/PSH/Family History/Social History/Meds/Allergies:    Past Medical History:  Diagnosis Date   Anxiety    states frequent  anxiety attacks   Asthma    Cancer (HCC)    HX OF THYROID  CANCER    Cold no drainage   since thursday 07-21-12, chest xray done 07-24-2012    COPD (chronic obstructive pulmonary disease) (HCC)    LOV with PFT 1/13  Dr [name] on chart-   pt states no changes since last saw pulmonary   Depression    Fibromyalgia    states has had trigger point injections shoulders-    GERD (gastroesophageal reflux disease)    Headache(784.0)    migraines   History of COVID-19 05/2023   History of DVT of lower extremity 1972   right leg   Hyperlipidemia    Hypothyroidism    OA (osteoarthritis)    On home oxygen therapy    2 liters per minute per [place] as needed   Pneumonia    2008   Seasonal allergies    sgtates clear nasal drainage x 3- days- states no fever   Shortness of breath    oxygen at 2 liters at night, WITH EXERTION    Past Surgical History:  Procedure Laterality Date   ABDOMINAL HYSTERECTOMY  1990   bilateral salpingo-oophorectomy   BARTHOLIN GLAND CYST EXCISION  1977   PRIOR MARSIPULIZATION IN 1974   BOWEL SURGERY FOR OBSTRUCTION  1992   CLOSED MANIPULATION RIGHT KNEE REPLACEMENT  09-10-2010   DIAGNOSTIC LAPAROSCOPY  1989   x2-3 with ovarian cystectomy   FINGER ARTHROPLASTY  07/29/2012   Procedure: FINGER ARTHROPLASTY;  Surgeon: Samantha SHAUNNA Bern, MD;  Location: [facility];  Service: Orthopedics;  Laterality: Left;  Left Thumb, CMC Interpositional Arthroplasty Left Thumb,with Palmaris Longis Tendon Graft, IPSI Lateral      HEMORRHOID SURGERY  1988   KNEE ARTHROSCOPY   10-26-2008   AND REMOVAL 2 SCREWS PROXIMAL TIBIA  OF RIGHT KNEE   KNEE ARTHROTOMY  LAST ONE 2000   right x 3 prior to replacement   LEFT SHOULDER ARTHROSCOPY  03-02-2012   REVERSE SHOULDER ARTHROPLASTY Left 01/24/2024   Procedure: LEFT REVERSE SHOULDER ARTHROPLASTY;  Surgeon: Samantha Standing, MD;  Location: [facility];  Service: Orthopedics;  Laterality: Left;   THYROID  LOBECTOMY  2009   PARTIAL LEFT LOWER LOBECTOMY   TONSILLECTOMY  1959   TOTAL KNEE ARTHROPLASTY  07-31-2010   OA  RIGHT KNEE   TOTAL KNEE ARTHROPLASTY Left 10/03/2020   Procedure: LEFT TOTAL KNEE ARTHROPLASTY;  Surgeon: Samantha Lonni GRADE, MD;  Location: [facility];  Service: Orthopedics;  Laterality: Left;   Social History   Socioeconomic History   Marital status: Married    Spouse name: Not on file   Number of children: Not on file   Years of education: Not on file   Highest education level: Not on file  Occupational History   Not on file  Tobacco Use   Smoking status: Former    Current packs/day: 0.00    Average packs/day: 1 pack/day for  43.0 years (43.0 ttl pk-yrs)    Types: Cigarettes    Quit date: 09/23/2021    Years since quitting: 3.0   Smokeless tobacco: Never  Vaping Use   Vaping status: Never Used  Substance and Sexual Activity   Alcohol use: No   Drug use: Yes    Types: Marijuana    Comment: smokes once every 3 weeks   Sexual activity: Not on file  Other Topics Concern   Not on file  Social History Narrative   Not on file   Social Drivers of Health   Financial Resource Strain: Not on file  Food Insecurity: Not on file  Transportation Needs: Not on file  Physical Activity: Not on file  Stress: Not on file  Social Connections: Not on file   No family history on file. Allergies  Allergen Reactions   Amitriptyline     Other Reaction(s): Unknown   Codeine Nausea And Vomiting   Doxycycline  Nausea And Vomiting    Pt states she can tolerate this   Imipramine Other (See Comments)   Lortab  [Hydrocodone -Acetaminophen ] Other (See Comments)    Messed up digestive system   Metronidazole Other (See Comments)   Penicillins Other (See Comments)    Family cannot take / update 03/02/12 - pt. does not remember the reaction.   Pregabalin     Other Reaction(s): Unknown   Sulfa Antibiotics Nausea And Vomiting   Tylenol  [Acetaminophen ] Other (See Comments)    Cannot take with Neurontin  / update 03/02/12 - causes diarrhea   Vistaril [Hydroxyzine Hcl] Other (See Comments)     Hallucinations   Current Outpatient Medications  Medication Sig Dispense Refill   HYDROcodone -acetaminophen  (NORCO/VICODIN) 5-325 MG tablet Take 1 tablet by mouth every 6 (six) hours as needed for moderate pain (pain score 4-6). 30 tablet 0   albuterol  (PROVENTIL  HFA;VENTOLIN  HFA) 108 (90 BASE) MCG/ACT inhaler Inhale 2 puffs into the lungs every 6 (six) hours as needed for wheezing or shortness of breath.      albuterol  (PROVENTIL ) (2.5 MG/3ML) 0.083% nebulizer solution Take 2.5 mg by nebulization every 6 (six) hours as needed for shortness of breath or wheezing.     Ascorbic Acid  (VITAMIN C ) 1000 MG tablet Take 1,000 mg by mouth daily.     aspirin  325 MG EC tablet TAKE 1 TABLET BY MOUTH TWICE A DAY AFTER MEAL (Patient not taking: Reported on 01/12/2024) 30 tablet 0   atorvastatin  (LIPITOR) 20 MG tablet Take 10 mg by mouth daily before breakfast.      Azelastine HCl 137 MCG/SPRAY SOLN Place 2 sprays into both nostrils 2 (two) times daily.     B Complex-C (B-COMPLEX WITH VITAMIN C ) tablet Take 1 tablet by mouth daily.     buPROPion  (WELLBUTRIN  XL) 300 MG 24 hr tablet Take 300 mg by mouth daily.     butalbital -aspirin -caffeine  (FIORINAL) 50-325-40 MG capsule Take 1 capsule by mouth 2 (two) times daily as needed for headache.     cetirizine (ZYRTEC) 10 MG tablet Take 10 mg by mouth daily.     clonazePAM  (KLONOPIN ) 0.5 MG tablet Take 0.25-0.5 mg by mouth in the morning, at noon, and at bedtime.     CREON 36000-114000 units  CPEP capsule Take 108,000-144,000 Units by mouth 3 (three) times daily with meals.     escitalopram  (LEXAPRO ) 10 MG tablet Take 10 mg by mouth daily.     estradiol  (ESTRACE ) 0.5 MG tablet Take 0.5 mg by mouth daily.     famotidine (  PEPCID) 40 MG tablet Take 40 mg by mouth 2 (two) times daily.     fentaNYL  (DURAGESIC ) 25 MCG/HR Place 1 patch onto the skin every other day.     fluticasone  (FLONASE ) 50 MCG/ACT nasal spray Place 1 spray into both nostrils daily as needed for allergies.      Fluticasone -Umeclidin-Vilant 100-62.5-25 MCG/INH AEPB Inhale 1 puff into the lungs daily.      folic acid (FOLVITE) 800 MCG tablet Take 800 mcg by mouth daily.     gabapentin  (NEURONTIN ) 300 MG capsule Take 600 mg by mouth 2 (two) times daily.     glycopyrrolate  (ROBINUL ) 1 MG tablet Take 1 mg by mouth at bedtime.     HYDROmorphone  (DILAUDID ) 2 MG tablet Take 2 mg by mouth 4 (four) times daily as needed for moderate pain.     HYDROmorphone  (DILAUDID ) 4 MG tablet Take 1 tablet (4 mg total) by mouth every 4 (four) hours as needed for severe pain (pain score 7-10). 30 tablet 0   HYDROmorphone  (DILAUDID ) 4 MG tablet Take 1 tablet (4 mg total) by mouth every 4 (four) hours as needed for severe pain (pain score 7-10) (if oxycodone  not effective). 30 tablet 0   hydroxypropyl methylcellulose / hypromellose (ISOPTO TEARS / GONIOVISC) 2.5 % ophthalmic solution Place 1 drop into both eyes as needed for dry eyes.     levothyroxine  (SYNTHROID , LEVOTHROID) 25 MCG tablet Take 25 mcg by mouth daily before breakfast.     linaclotide (LINZESS) 145 MCG CAPS capsule Take 145 mcg by mouth daily before breakfast.     Multiple Vitamin (MULTIVITAMIN WITH MINERALS) TABS tablet Take 1 tablet by mouth daily.     Multiple Vitamins-Minerals (PRESERVISION AREDS 2 PO) Take 1 capsule by mouth in the morning and at bedtime.     naratriptan (AMERGE) 2.5 MG tablet Take 2.5 mg by mouth as needed for migraine.      NARCAN  4 MG/0.1ML LIQD nasal spray  kit Place 0.4 mg into the nose once.      oxyCODONE  (ROXICODONE ) 5 MG immediate release tablet Take 1 tablet (5 mg total) by mouth every 4 (four) hours as needed for severe pain (pain score 7-10). 30 tablet 0   OXYGEN Inhale 3 L into the lungs as needed (shortness of breath).     predniSONE (DELTASONE) 20 MG tablet Take 40 mg by mouth daily.     rOPINIRole  (REQUIP ) 2 MG tablet Take 4 mg by mouth at bedtime.     traZODone (DESYREL) 50 MG tablet Take 100-150 mg by mouth at bedtime.     valACYclovir  (VALTREX ) 1000 MG tablet Take 1,000 mg by mouth 2 (two) times daily as needed (outbreak).      verapamil  (CALAN -SR) 120 MG CR tablet Take 120 mg by mouth daily.     vitamin E 180 MG (400 UNITS) capsule Take 400 Units by mouth daily.     No current facility-administered medications for this visit.   No results found.  Review of Systems:   A ROS was performed including pertinent positives and negatives as documented in the HPI.   Musculoskeletal Exam:    There were no vitals taken for this visit.  Left shoulder incision is well-appearing without erythema or drainage.  Active forward elevation is to 80 degrees.  External rotation at side is to 20 degrees.  Internal rotation deferred today  Imaging:    2 views left shoulder: Status post reverse shoulder arthroplasty without evidence of complication  I personally reviewed  and interpreted the radiographs.   Assessment:   Status post reverse shoulder arthroplasty without evidence of complication.  Overall her range of motion is improving nicely.  At this time she is doing extremely well.  We will plan for her scheduled right shoulder reverse shoulder arthroplasty in the upcoming weeks Plan :    -Turn to clinic 12 weeks for reassessment      I personally saw and evaluated the patient, and participated in the management and treatment plan.  Samantha Parker, MD Attending Physician, Orthopedic Surgery  This document was dictated using  Dragon voice recognition software. A reasonable attempt at proof reading has been made to minimize errors.

## 2024-10-12 ENCOUNTER — Ambulatory Visit (HOSPITAL_BASED_OUTPATIENT_CLINIC_OR_DEPARTMENT_OTHER): Payer: Self-pay | Admitting: Orthopaedic Surgery

## 2024-10-12 ENCOUNTER — Encounter (HOSPITAL_COMMUNITY): Payer: Self-pay | Admitting: Orthopaedic Surgery

## 2024-10-12 DIAGNOSIS — M12811 Other specific arthropathies, not elsewhere classified, right shoulder: Secondary | ICD-10-CM

## 2024-10-12 NOTE — Progress Notes (Signed)
 Per Dr. Genelle, pt is to stop taking her ASA two days prior to surgery

## 2024-10-13 ENCOUNTER — Other Ambulatory Visit (HOSPITAL_BASED_OUTPATIENT_CLINIC_OR_DEPARTMENT_OTHER): Payer: Self-pay | Admitting: Orthopaedic Surgery

## 2024-10-13 ENCOUNTER — Encounter (HOSPITAL_COMMUNITY): Payer: Self-pay | Admitting: Orthopaedic Surgery

## 2024-10-13 ENCOUNTER — Other Ambulatory Visit: Payer: Self-pay

## 2024-10-13 DIAGNOSIS — M12811 Other specific arthropathies, not elsewhere classified, right shoulder: Secondary | ICD-10-CM

## 2024-10-13 NOTE — Pre-Procedure Instructions (Signed)
-------------    SDW INSTRUCTIONS given:  Your procedure is scheduled on 11/25.  Report to [facility] Main Entrance A at 10:30 A.M., and check in at the Admitting office.  Any questions or running late day of surgery: call [phone]    Remember:  Do not eat after midnight the night before your surgery  You may drink clear liquids until 10:00 AM the morning of your surgery.   Clear liquids allowed are: Water , Non-Citrus Juices (without pulp), Carbonated Beverages, Clear Tea, Black Coffee Only, and Gatorade    Take these medicines the morning of surgery with A SIP OF WATER   atorvastatin , azelastine, bupropion , zyrtec, lexapro , estrace , pepcid, gabapentin , levothyroxine , linzess, trelegy     May take these medicines IF NEEDED: albuterol , klonopin , dilaudid , eye drops, naratriptan    As of today, STOP taking any Aspirin  (unless otherwise instructed by your surgeon) Aleve, Naproxen, Ibuprofen, Motrin, Advil, Goody's, BC's, all herbal medications, fish oil, and all vitamins.   Do NOT Smoke (Tobacco/Vaping) 24 hours prior to your procedure  If you use a CPAP at night, you may bring all equipment for your overnight stay.     You will be asked to remove any contacts, glasses, piercing's, hearing aid's, dentures/partials prior to surgery. Please bring cases for these items if needed.     Patients discharged the day of surgery will not be allowed to drive home, and someone needs to stay with them for 24 hours.  SURGICAL WAITING ROOM VISITATION Patients may have no more than 2 support people in the waiting area - these visitors may rotate.   Pre-op nurse will coordinate an appropriate time for 1 ADULT support person, who may not rotate, to accompany patient in pre-op.  Children under the age of 49 must have an adult with them who is not the patient and must remain in the main waiting area with an adult.  If the patient needs to stay at the hospital during part of their recovery, the  visitor guidelines for inpatient rooms apply.  Please refer to the [facility] website for the visitor guidelines for any additional information.   Special instructions:   [facility]- Preparing For Surgery   Please follow these instructions carefully.   Shower the NIGHT BEFORE SURGERY and the MORNING OF SURGERY with DIAL Soap.   Pat yourself dry with a CLEAN TOWEL.  Wear CLEAN PAJAMAS to bed the night before surgery  Place CLEAN SHEETS on your bed the night of your first shower and DO NOT SLEEP WITH PETS.   Additional instructions for the day of surgery: DO NOT APPLY any lotions, deodorants, cologne, or perfumes.   Do not wear jewelry or makeup Do not wear nail polish, gel polish, artificial nails, or any other type of covering on natural nails (fingers and toes) Do not bring valuables to the hospital. [facility] is not responsible for valuables/personal belongings. Put on clean/comfortable clothes.  Please brush your teeth.  Ask your nurse before applying any prescription medications to the skin.

## 2024-10-13 NOTE — Progress Notes (Signed)
 Anesthesia Chart Review: Same day workup  73 year old female with pertinent history including former smoker (greater than 100-pack-year history, quit 2022) with associated oxygen dependent COPD, remote DVT, chronic pain (fentanyl  patch 25 mcg/h and hydromorphone  2 mg every 6 hours as needed), asthma, GERD, hypothyroid, migraines, anxiety, fibromyalgia, Raynaud's.   History of oxygen dependent COPD, uses 3 L supplemental O2 continuous.  She is followed by pulmonology at [facility] in [place].  Last seen by Dr. Ivin 09/26/2024.  Per note, MMRC > 2. She is currently experiencing an exacerbation and will give course of antibiotics and prednisone. Will also continue current regimen of ICS/LAMA/LABA with Trelegy and as needed bronchodilators.  Patient underwent left reverse shoulder arthroplasty 01/24/2024 without complication.  She will need day of surgery labs and evaluation.   EKG 06/28/2023: NSR.  Rate 71.   Coronary calcium  scan 01/08/2022: FINDINGS: Non-cardiac: See separate report from [facility].   Ascending Aorta: Normal caliber.   Aortic atherosclerosis.   Pericardium: Normal.   Coronary arteries: Normal origins.   Coronary Calcium  Score:   Left main: 0   Left anterior descending artery: 0   Left circumflex artery: 0   Right coronary artery: 0   Total: 0   Percentile: 1st for age, sex, and race matched control.   IMPRESSION: 1. Coronary calcium  score of 0. This was 1st percentile for age, gender, and race matched controls.    Lynwood Geofm RIGGERS [facility] Short Stay Center/Anesthesiology Phone [phone] 10/13/2024 11:56 AM

## 2024-10-13 NOTE — Anesthesia Preprocedure Evaluation (Signed)
 Anesthesia Evaluation  Patient identified by MRN, date of birth, ID band Patient awake    Reviewed: Allergy & Precautions, NPO status , Patient's Chart, lab work & pertinent test results  History of Anesthesia Complications Negative for: history of anesthetic complications  Airway Mallampati: II  TM Distance: >3 FB Neck ROM: Full    Dental  (+) Edentulous Upper, Edentulous Lower   Pulmonary neg shortness of breath, asthma , pneumonia, COPD (3 LPM as needed, denies continuous use when asked),  oxygen dependent, neg recent URI, Current Smoker and Patient abstained from smoking., former smoker   breath sounds clear to auscultation       Cardiovascular hypertension, (-) angina + DVT (1972)  (-) Past MI, (-) Cardiac Stents and (-) CABG (-) dysrhythmias  Rhythm:Regular Rate:Normal  HLD   Neuro/Psych  Headaches, neg Seizures PSYCHIATRIC DISORDERS Anxiety Depression    Chronic pain (fentanyl  patch 25 mcg/h, hydromorphone ))    GI/Hepatic ,GERD  ,,(+)     substance abuse  marijuana useH/o alcohol-induced pancreatitis   Endo/Other  neg diabetesHypothyroidism  H/o thyroid  cancer  Renal/GU negative Renal ROS     Musculoskeletal  (+) Arthritis , Osteoarthritis,  Fibromyalgia -Scoliosis    Abdominal   Peds  Hematology negative hematology ROS (+) Lab Results      Component                Value               Date                      WBC                      12.2 (H)            01/17/2024                HGB                      14.2                01/17/2024                HCT                      45.3                01/17/2024                MCV                      93.8                01/17/2024                PLT                      239                 01/17/2024              Anesthesia Other Findings   Reproductive/Obstetrics                              Anesthesia Physical Anesthesia  Plan  ASA: 4  Anesthesia Plan: General   Post-op Pain Management: Regional block* and Tylenol  PO (  pre-op)*   Induction: Intravenous  PONV Risk Score and Plan: 3 and Ondansetron , Dexamethasone  and Treatment may vary due to age or medical condition  Airway Management Planned: Oral ETT  Additional Equipment:   Intra-op Plan:   Post-operative Plan: Extubation in OR  Informed Consent: I have reviewed the patients History and Physical, chart, labs and discussed the procedure including the risks, benefits and alternatives for the proposed anesthesia with the patient or authorized representative who has indicated his/her understanding and acceptance.     Dental advisory given  Plan Discussed with: CRNA  Anesthesia Plan Comments: (Risks of anesthesia explained at length. This includes, but is not limited to, sore throat, damage to teeth, lips gums, tongue and vocal cords, nausea and vomiting, reactions to medications, stroke, heart attack, and death. All patient questions were answered and the patient wishes to proceed. Risks of peripheral nerve block explained at length. This includes, but is not limited to, bleeding, infection, reactions to the medications, seizures, damage to surrounding structures, damage to nerves, permanent weakness, numbness, tingling and pain. We also spoke specifically about worsening respiratory status with interscalene block, which could lead to need for postop ventilation or delay in discharge. All patient questions were answered and patient wishes to proceed with nerve block.   PAT note by Lynwood Hope, PA-C: 73 year old female with pertinent history including former smoker (greater than 100-pack-year history, quit 2022) with associated oxygen dependent COPD, remote DVT, chronic pain (fentanyl  patch 25 mcg/h and hydromorphone  2 mg every 6 hours as needed), asthma, GERD, hypothyroid, migraines, anxiety, fibromyalgia, Raynaud's.   History of oxygen dependent COPD,  uses 3 L supplemental O2 continuous.  She is followed by pulmonology at [facility] in [place].  Last seen by Dr. Ivin 09/26/2024.  Per note, MMRC > 2. She is currently experiencing an exacerbation and will give course of antibiotics and prednisone. Will also continue current regimen of ICS/LAMA/LABA with Trelegy and as needed bronchodilators.  Patient underwent left reverse shoulder arthroplasty 01/24/2024 without complication.  She will need day of surgery labs and evaluation.   EKG 06/28/2023: NSR.  Rate 71.   Coronary calcium  scan 01/08/2022: FINDINGS: Non-cardiac: See separate report from [facility].   Ascending Aorta: Normal caliber.   Aortic atherosclerosis.   Pericardium: Normal.   Coronary arteries: Normal origins.   Coronary Calcium  Score:   Left main: 0   Left anterior descending artery: 0   Left circumflex artery: 0   Right coronary artery: 0   Total: 0   Percentile: 1st for age, sex, and race matched control.   IMPRESSION: 1. Coronary calcium  score of 0. This was 1st percentile for age, gender, and race matched controls.   )         Anesthesia Quick Evaluation

## 2024-10-13 NOTE — Progress Notes (Signed)
 PCP - Dr. Velna Skeeter Cardiologist - denies Pulmonologist- Dr. Tijani Sulaiman (w/ Alonso)  PPM/ICD - denies   Chest x-ray - 10/30/12 EKG - DOS Stress Test - denies ECHO - denies Cardiac Cath - denies  CPAP - denies  Pt wears 2 L O2 continuously.  DM- denies  ASA/Blood Thinner Instructions: n/a   ERAS Protcol - clears until 1000  COVID TEST- n/a  Anesthesia review: yes, continuous O2, wears fentanyl  patch  Patient verbally denies any shortness of breath, fever, cough and chest pain during phone call      Questions were answered. Patient verbalized understanding of instructions.

## 2024-10-17 ENCOUNTER — Ambulatory Visit (HOSPITAL_COMMUNITY)
Admission: RE | Admit: 2024-10-17 | Discharge: 2024-10-17 | Disposition: A | Discharge status: Home / Self Care | Attending: Orthopaedic Surgery | Admitting: Orthopaedic Surgery

## 2024-10-17 ENCOUNTER — Ambulatory Visit (HOSPITAL_COMMUNITY): Payer: Self-pay | Admitting: Physician Assistant

## 2024-10-17 ENCOUNTER — Other Ambulatory Visit: Payer: Self-pay

## 2024-10-17 ENCOUNTER — Encounter (HOSPITAL_COMMUNITY): Payer: Self-pay | Admitting: Orthopaedic Surgery

## 2024-10-17 ENCOUNTER — Encounter (HOSPITAL_COMMUNITY)
Admission: RE | Disposition: A | Discharge status: Home / Self Care | Payer: Self-pay | Source: Home / Self Care | Attending: Orthopaedic Surgery

## 2024-10-17 ENCOUNTER — Ambulatory Visit (HOSPITAL_COMMUNITY)

## 2024-10-17 DIAGNOSIS — Z87891 Personal history of nicotine dependence: Secondary | ICD-10-CM | POA: Diagnosis not present

## 2024-10-17 DIAGNOSIS — G43909 Migraine, unspecified, not intractable, without status migrainosus: Secondary | ICD-10-CM | POA: Insufficient documentation

## 2024-10-17 DIAGNOSIS — Z79891 Long term (current) use of opiate analgesic: Secondary | ICD-10-CM | POA: Diagnosis not present

## 2024-10-17 DIAGNOSIS — J4489 Other specified chronic obstructive pulmonary disease: Secondary | ICD-10-CM | POA: Diagnosis not present

## 2024-10-17 DIAGNOSIS — F17210 Nicotine dependence, cigarettes, uncomplicated: Secondary | ICD-10-CM | POA: Diagnosis not present

## 2024-10-17 DIAGNOSIS — F32A Depression, unspecified: Secondary | ICD-10-CM | POA: Insufficient documentation

## 2024-10-17 DIAGNOSIS — F419 Anxiety disorder, unspecified: Secondary | ICD-10-CM | POA: Diagnosis not present

## 2024-10-17 DIAGNOSIS — E785 Hyperlipidemia, unspecified: Secondary | ICD-10-CM

## 2024-10-17 DIAGNOSIS — M75101 Unspecified rotator cuff tear or rupture of right shoulder, not specified as traumatic: Secondary | ICD-10-CM | POA: Diagnosis not present

## 2024-10-17 DIAGNOSIS — Z86718 Personal history of other venous thrombosis and embolism: Secondary | ICD-10-CM | POA: Insufficient documentation

## 2024-10-17 DIAGNOSIS — M12811 Other specific arthropathies, not elsewhere classified, right shoulder: Secondary | ICD-10-CM | POA: Insufficient documentation

## 2024-10-17 DIAGNOSIS — G8929 Other chronic pain: Secondary | ICD-10-CM | POA: Diagnosis not present

## 2024-10-17 DIAGNOSIS — K219 Gastro-esophageal reflux disease without esophagitis: Secondary | ICD-10-CM | POA: Insufficient documentation

## 2024-10-17 DIAGNOSIS — I10 Essential (primary) hypertension: Secondary | ICD-10-CM

## 2024-10-17 DIAGNOSIS — M797 Fibromyalgia: Secondary | ICD-10-CM | POA: Diagnosis not present

## 2024-10-17 DIAGNOSIS — Z85850 Personal history of malignant neoplasm of thyroid: Secondary | ICD-10-CM | POA: Insufficient documentation

## 2024-10-17 DIAGNOSIS — E039 Hypothyroidism, unspecified: Secondary | ICD-10-CM | POA: Insufficient documentation

## 2024-10-17 HISTORY — DX: Essential (primary) hypertension: I10

## 2024-10-17 HISTORY — PX: REVERSE SHOULDER ARTHROPLASTY: SHX5054

## 2024-10-17 HISTORY — DX: Scoliosis, unspecified: M41.9

## 2024-10-17 LAB — SURGICAL PCR SCREEN
MRSA, PCR: NEGATIVE
Staphylococcus aureus: NEGATIVE

## 2024-10-17 LAB — CBC
HCT: 39.3 % (ref 36.0–46.0)
Hemoglobin: 12.3 g/dL (ref 12.0–15.0)
MCH: 28.5 pg (ref 26.0–34.0)
MCHC: 31.3 g/dL (ref 30.0–36.0)
MCV: 91.2 fL (ref 80.0–100.0)
Platelets: 213 K/uL (ref 150–400)
RBC: 4.31 MIL/uL (ref 3.87–5.11)
RDW: 14.7 % (ref 11.5–15.5)
WBC: 10.2 K/uL (ref 4.0–10.5)
nRBC: 0 % (ref 0.0–0.2)

## 2024-10-17 LAB — BASIC METABOLIC PANEL WITH GFR
Anion gap: 9 (ref 5–15)
BUN: 10 mg/dL (ref 8–23)
CO2: 24 mmol/L (ref 22–32)
Calcium: 8.2 mg/dL — ABNORMAL LOW (ref 8.9–10.3)
Chloride: 105 mmol/L (ref 98–111)
Creatinine, Ser: 0.72 mg/dL (ref 0.44–1.00)
GFR, Estimated: >60 mL/min (ref >60)
Glucose, Bld: 77 mg/dL (ref 70–99)
Potassium: 4 mmol/L (ref 3.5–5.1)
Sodium: 138 mmol/L (ref 135–145)

## 2024-10-17 SURGERY — ARTHROPLASTY, SHOULDER, TOTAL, REVERSE
Anesthesia: General | Site: Shoulder | Laterality: Right

## 2024-10-17 MED ORDER — ONDANSETRON HCL 4 MG/2ML IJ SOLN
INTRAMUSCULAR | Status: DC | PRN
  Administered 2024-10-17: 4 mg via INTRAVENOUS

## 2024-10-17 MED ORDER — FENTANYL CITRATE (PF) 100 MCG/2ML IJ SOLN
INTRAMUSCULAR | Status: AC
  Administered 2024-10-17: 50 ug via INTRAVENOUS
  Filled 2024-10-17: qty 2

## 2024-10-17 MED ORDER — SODIUM CHLORIDE 0.9 % IR SOLN
Status: DC | PRN
  Administered 2024-10-17: 1000 mL

## 2024-10-17 MED ORDER — LIDOCAINE 2% (20 MG/ML) 5 ML SYRINGE
INTRAMUSCULAR | Status: DC | PRN
  Administered 2024-10-17: 40 mg via INTRAVENOUS

## 2024-10-17 MED ORDER — TRANEXAMIC ACID-NACL 1000-0.7 MG/100ML-% IV SOLN
1000.0000 mg | INTRAVENOUS | Status: AC
  Administered 2024-10-17: 1000 mg via INTRAVENOUS
  Filled 2024-10-17: qty 100

## 2024-10-17 MED ORDER — LIDOCAINE 2% (20 MG/ML) 5 ML SYRINGE
INTRAMUSCULAR | Status: AC
  Filled 2024-10-17: qty 5

## 2024-10-17 MED ORDER — HYDROMORPHONE HCL 4 MG PO TABS: 4.0000 mg | ORAL_TABLET | ORAL | 0 refills | Status: DC | PRN

## 2024-10-17 MED ORDER — SUGAMMADEX SODIUM 200 MG/2ML IV SOLN
INTRAVENOUS | Status: DC | PRN
  Administered 2024-10-17: 200 mg via INTRAVENOUS

## 2024-10-17 MED ORDER — LACTATED RINGERS IV SOLN: INTRAVENOUS | Status: DC

## 2024-10-17 MED ORDER — ROCURONIUM BROMIDE 10 MG/ML (PF) SYRINGE
PREFILLED_SYRINGE | INTRAVENOUS | Status: DC | PRN
  Administered 2024-10-17: 30 mg via INTRAVENOUS

## 2024-10-17 MED ORDER — MIDAZOLAM HCL 2 MG/2ML IJ SOLN
INTRAMUSCULAR | Status: AC
  Filled 2024-10-17: qty 2

## 2024-10-17 MED ORDER — ONDANSETRON HCL 4 MG/2ML IJ SOLN
INTRAMUSCULAR | Status: AC
  Filled 2024-10-17: qty 2

## 2024-10-17 MED ORDER — CEFAZOLIN SODIUM-DEXTROSE 2-4 GM/100ML-% IV SOLN
2.0000 g | INTRAVENOUS | Status: AC
  Administered 2024-10-17: 2 g via INTRAVENOUS

## 2024-10-17 MED ORDER — FENTANYL CITRATE (PF) 100 MCG/2ML IJ SOLN
INTRAMUSCULAR | Status: AC
  Filled 2024-10-17: qty 2

## 2024-10-17 MED ORDER — ACETAMINOPHEN 500 MG PO TABS
ORAL_TABLET | ORAL | Status: AC
  Administered 2024-10-17: 1000 mg via ORAL
  Filled 2024-10-17: qty 2

## 2024-10-17 MED ORDER — GABAPENTIN 300 MG PO CAPS
ORAL_CAPSULE | ORAL | Status: AC
  Filled 2024-10-17: qty 1

## 2024-10-17 MED ORDER — PHENYLEPHRINE 80 MCG/ML (10ML) SYRINGE FOR IV PUSH (FOR BLOOD PRESSURE SUPPORT)
PREFILLED_SYRINGE | INTRAVENOUS | Status: DC | PRN
  Administered 2024-10-17 (×2): 80 ug via INTRAVENOUS

## 2024-10-17 MED ORDER — CEFAZOLIN SODIUM-DEXTROSE 2-4 GM/100ML-% IV SOLN
INTRAVENOUS | Status: AC
  Filled 2024-10-17: qty 100

## 2024-10-17 MED ORDER — MIDAZOLAM HCL (PF) 2 MG/2ML IJ SOLN
INTRAMUSCULAR | Status: DC | PRN
  Administered 2024-10-17: 2 mg via INTRAVENOUS

## 2024-10-17 MED ORDER — ROCURONIUM BROMIDE 10 MG/ML (PF) SYRINGE
PREFILLED_SYRINGE | INTRAVENOUS | Status: AC
  Filled 2024-10-17: qty 10

## 2024-10-17 MED ORDER — FENTANYL CITRATE (PF) 250 MCG/5ML IJ SOLN
INTRAMUSCULAR | Status: DC | PRN
  Administered 2024-10-17 (×2): 50 ug via INTRAVENOUS

## 2024-10-17 MED ORDER — DEXAMETHASONE SOD PHOSPHATE PF 10 MG/ML IJ SOLN
INTRAMUSCULAR | Status: DC | PRN
  Administered 2024-10-17: 10 mg via INTRAVENOUS

## 2024-10-17 MED ORDER — PROPOFOL 10 MG/ML IV BOLUS
INTRAVENOUS | Status: DC | PRN
  Administered 2024-10-17: 60 mg via INTRAVENOUS

## 2024-10-17 MED ORDER — FENTANYL CITRATE (PF) 100 MCG/2ML IJ SOLN: 50.0000 ug | Freq: Once | INTRAMUSCULAR | Status: AC

## 2024-10-17 MED ORDER — PROPOFOL 10 MG/ML IV BOLUS
INTRAVENOUS | Status: AC
  Filled 2024-10-17: qty 20

## 2024-10-17 MED ORDER — VANCOMYCIN HCL 1000 MG IV SOLR
INTRAVENOUS | Status: AC
  Filled 2024-10-17: qty 20

## 2024-10-17 MED ORDER — ASPIRIN 325 MG PO TBEC: 325.0000 mg | DELAYED_RELEASE_TABLET | Freq: Every day | ORAL | 0 refills | Status: AC

## 2024-10-17 MED ORDER — PHENYLEPHRINE HCL-NACL 20-0.9 MG/250ML-% IV SOLN
INTRAVENOUS | Status: AC
  Filled 2024-10-17: qty 250

## 2024-10-17 MED ORDER — PHENYLEPHRINE HCL-NACL 20-0.9 MG/250ML-% IV SOLN
INTRAVENOUS | Status: DC | PRN
  Administered 2024-10-17: 30 ug/min via INTRAVENOUS

## 2024-10-17 MED ORDER — CHLORHEXIDINE GLUCONATE 0.12 % MT SOLN
15.0000 mL | Freq: Once | OROMUCOSAL | Status: AC
Start: 2024-10-17 — End: 2024-10-17
  Administered 2024-10-17: 15 mL via OROMUCOSAL
  Filled 2024-10-17: qty 15

## 2024-10-17 MED ORDER — EPHEDRINE 5 MG/ML INJ
INTRAVENOUS | Status: AC
  Filled 2024-10-17: qty 5

## 2024-10-17 MED ORDER — EPHEDRINE SULFATE-NACL 50-0.9 MG/10ML-% IV SOSY
PREFILLED_SYRINGE | INTRAVENOUS | Status: DC | PRN
  Administered 2024-10-17: 10 mg via INTRAVENOUS

## 2024-10-17 MED ORDER — ACETAMINOPHEN 500 MG PO TABS: 500.0000 mg | ORAL_TABLET | Freq: Three times a day (TID) | ORAL | 0 refills | Status: AC

## 2024-10-17 MED ORDER — ORAL CARE MOUTH RINSE: 15.0000 mL | Freq: Once | OROMUCOSAL | Status: AC

## 2024-10-17 MED ORDER — GABAPENTIN 300 MG PO CAPS: 300.0000 mg | ORAL_CAPSULE | Freq: Once | ORAL | Status: DC

## 2024-10-17 MED ORDER — ACETAMINOPHEN 500 MG PO TABS: 1000.0000 mg | ORAL_TABLET | Freq: Once | ORAL | Status: AC

## 2024-10-17 SURGICAL SUPPLY — 54 items
AUGMENT BASEPLATE 15DEG 25 WDG (Joint) IMPLANT
BAG COUNTER SPONGE SURGICOUNT (BAG) ×2 IMPLANT
BIT DRILL 3.2 PERIPHERAL SCREW (BIT) IMPLANT
BLADE SAW SAG 29X58X.64 (BLADE) IMPLANT
CHLORAPREP W/TINT 26 (MISCELLANEOUS) ×2 IMPLANT
COVER SURGICAL LIGHT HANDLE (MISCELLANEOUS) ×2 IMPLANT
CUP HUM SYS INSERT SZ 1/2 36 (Joint) IMPLANT
DRAPE IMP U-DRAPE 54X76 (DRAPES) ×2 IMPLANT
DRAPE INCISE IOBAN 66X45 STRL (DRAPES) ×2 IMPLANT
DRAPE U-SHAPE 47X51 STRL (DRAPES) ×4 IMPLANT
DRSG AQUACEL AG ADV 3.5X10 (GAUZE/BANDAGES/DRESSINGS) ×2 IMPLANT
ELECTRODE BLDE 4.0 EZ CLN MEGD (MISCELLANEOUS) ×2 IMPLANT
ELECTRODE REM PT RTRN 9FT ADLT (ELECTROSURGICAL) ×2 IMPLANT
GAUZE PAD ABD 8X10 STRL (GAUZE/BANDAGES/DRESSINGS) IMPLANT
GAUZE SPONGE 4X4 12PLY STRL (GAUZE/BANDAGES/DRESSINGS) IMPLANT
GAUZE XEROFORM 1X8 LF (GAUZE/BANDAGES/DRESSINGS) IMPLANT
GLENOSPHERE REV SHOULDER 36 (Joint) IMPLANT
GLOVE BIO SURGEON STRL SZ7.5 (GLOVE) ×6 IMPLANT
GLOVE BIOGEL PI IND STRL 6.5 (GLOVE) ×2 IMPLANT
GLOVE BIOGEL PI IND STRL 8 (GLOVE) ×4 IMPLANT
GLOVE BIOGEL PI MICRO STRL 6 (GLOVE) ×2 IMPLANT
GLOVE INDICATOR 8.0 STRL GRN (GLOVE) ×2 IMPLANT
GOWN STRL REUS W/ TWL LRG LVL3 (GOWN DISPOSABLE) ×4 IMPLANT
GUIDEWIRE GLENOID 2.5X220 (WIRE) IMPLANT
KIT BASIN OR (CUSTOM PROCEDURE TRAY) ×2 IMPLANT
KIT STABILIZATION SHOULDER (MISCELLANEOUS) ×2 IMPLANT
KIT TURNOVER KIT B (KITS) ×2 IMPLANT
MANIFOLD NEPTUNE II (INSTRUMENTS) ×2 IMPLANT
NDL HYPO 21X1 ECLIPSE (NEEDLE) ×2 IMPLANT
NDL MAYO TROCAR (NEEDLE) ×2 IMPLANT
PACK SHOULDER (CUSTOM PROCEDURE TRAY) ×2 IMPLANT
PACK UNIVERSAL I (CUSTOM PROCEDURE TRAY) ×2 IMPLANT
PAD ARMBOARD POSITIONER FOAM (MISCELLANEOUS) ×4 IMPLANT
PIN GUIDE 3X75 SHOULDER (PIN) IMPLANT
RESTRAINT HEAD UNIVERSAL NS (MISCELLANEOUS) ×2 IMPLANT
SCREW 5.0X18 (Screw) IMPLANT
SCREW BONE INTRNL SM 7 (Screw) IMPLANT
SCREW PERIPHERAL 30 (Screw) IMPLANT
SET HNDPC FAN SPRY TIP SCT (DISPOSABLE) ×2 IMPLANT
SLING ARM FOAM STRAP MED (SOFTGOODS) IMPLANT
SLING ARM IMMOBILIZER LRG (SOFTGOODS) ×2 IMPLANT
SOLN 0.9% NACL POUR BTL 1000ML (IV SOLUTION) ×2 IMPLANT
SOLN STERILE WATER BTL 1000 ML (IV SOLUTION) ×2 IMPLANT
SPONGE T-LAP 18X18 ~~LOC~~+RFID (SPONGE) ×2 IMPLANT
STAPLER SKIN PROX 35W (STAPLE) ×2 IMPLANT
STEM HUMERAL PLUS LONG 1+ (Orthopedic Implant) IMPLANT
SUCTION TUBE FRAZIER 10FR DISP (SUCTIONS) ×2 IMPLANT
SUT VIC AB 0 CT1 27XBRD ANBCTR (SUTURE) ×4 IMPLANT
SUT VIC AB 2-0 CT1 TAPERPNT 27 (SUTURE) ×4 IMPLANT
SYR 50ML LL SCALE MARK (SYRINGE) ×2 IMPLANT
TAPE LABRALWHITE 1.5X36 (TAPE) IMPLANT
TAPE SUT LABRALTAP WHT/BLK (SUTURE) IMPLANT
TOWEL GREEN STERILE (TOWEL DISPOSABLE) ×2 IMPLANT
TRAY FOLEY MTR SLVR 16FR STAT (SET/KITS/TRAYS/PACK) ×2 IMPLANT

## 2024-10-17 NOTE — Discharge Instructions (Signed)
 Discharge Instructions    Attending Surgeon: Elspeth Parker, MD Office Phone Number: [phone]   Diagnosis and Procedures:    Surgeries Performed: Right shoulder reverse shoulder replacement  Discharge Plan:    Diet: Resume usual diet. Begin with light or bland foods.  Drink plenty of fluids.  Activity:  Keep sling and dressing in place until your follow up visit in Physical Therapy You are advised to go home directly from the hospital or surgical center. Restrict your activities.  GENERAL INSTRUCTIONS: 1.  Keep your surgical site elevated above your heart for at least 5-7 days or longer to prevent swelling. This will improve your comfort and your overall recovery following surgery.     2. Please call Dr. Danetta office at [phone] with questions Monday-Friday during business hours. If no one answers, please leave a message and someone should get back to the patient within 24 hours. For emergencies please call 911 or proceed to the emergency room.   3. Patient to notify surgical team if experiences any of the following: Bowel/Bladder dysfunction, uncontrolled pain, nerve/muscle weakness, incision with increased drainage or redness, nausea/vomiting and Fever greater than 101.0 F.  Be alert for signs of infection including redness, streaking, odor, fever or chills. Be alert for excessive pain or bleeding and notify your surgeon immediately.  WOUND INSTRUCTIONS:   Leave your dressing/cast/splint in place until your post operative visit.  Keep it clean and dry.  Always keep the incision clean and dry until the staples/sutures are removed. If there is no drainage from the incision you should keep it open to air. If there is drainage from the incision you must keep it covered at all times until the drainage stops  Do not soak in a bath tub, hot tub, pool, lake or other body of water  until 21 days after your surgery and your incision is completely dry and healed.  If you  have removable sutures (or staples) they must be removed 10-14 days (unless otherwise instructed) from the day of your surgery.     1)  Elevate the extremity as much as possible.  2)  Keep the dressing clean and dry.  3)  Please call us  if the dressing becomes wet or dirty.  4)  If you are experiencing worsening pain or worsening swelling, please call.     MEDICATIONS: Resume all previous home medications at the previous prescribed dose and frequency unless otherwise noted Start taking the  pain medications on an as-needed basis as prescribed  Please taper down pain medication over the next week following surgery.  Ideally you should not require a refill of any narcotic pain medication.  Take pain medication with food to minimize nausea. In addition to the prescribed pain medication, you may take over-the-counter pain relievers such as Tylenol .  Do NOT take additional tylenol  if your pain medication already has tylenol  in it.  Aspirin  325mg  daily per bottle instructions. Narcotic Policy: Per [facility] clinic policy, our goal is ensure optimal postoperative pain control with a multimodal pain management strategy. For all [facility] patients, our goal is to wean post-operative narcotic medications by 6 weeks post-operatively, and many times sooner. If this is not possible due to utilization of pain medication prior to surgery, your [facility] doctor will support your acute post-operative pain control for the first 6 weeks postoperatively, with a plan to transition you back to your primary pain team following that. Mar[facility]e will work to ensure a therapist, occupational.  FOLLOWUP INSTRUCTIONS: 1. Follow up at the Physical Therapy Clinic 3-4 days following surgery. This appointment should be scheduled unless other arrangements have been made.The Physical Therapy scheduling number is [phone] if an appointment has not already been arranged.  2. Contact Dr. Danetta office during office hours at  [phone] or the practice after hours line at [phone] for non-emergencies. For medical emergencies call 911.   Discharge Location: Home

## 2024-10-17 NOTE — Interval H&P Note (Signed)
 History and Physical Interval Note:  10/17/2024 12:29 PM  [name]  has presented today for surgery, with the diagnosis of RIGHT ROTATOR CUFF ARTHROPATHY.  The various methods of treatment have been discussed with the patient and family. After consideration of risks, benefits and other options for treatment, the patient has consented to  Procedure(s): ARTHROPLASTY, SHOULDER, TOTAL, REVERSE (Right) as a surgical intervention.  The patient's history has been reviewed, patient examined, no change in status, stable for surgery.  I have reviewed the patient's chart and labs.  Questions were answered to the patient's satisfaction.     Shanitha Twining

## 2024-10-17 NOTE — Transfer of Care (Signed)
 Immediate Anesthesia Transfer of Care Note  Patient: [name]  Procedure(s) Performed: ARTHROPLASTY, SHOULDER, TOTAL, REVERSE (Right: Shoulder)  Patient Location: PACU  Anesthesia Type:GA combined with regional for post-op pain  Level of Consciousness: awake, alert , and oriented  Airway & Oxygen Therapy: Patient Spontanous Breathing and Patient connected to face mask oxygen  Post-op Assessment: Report given to RN and Post -op Vital signs reviewed and stable  Post vital signs: Reviewed and stable  Last Vitals:  Vitals Value Taken Time  BP 109/61 10/17/24 15:23  Temp    Pulse 68 10/17/24 15:25  Resp 10 10/17/24 15:25  SpO2 100 % 10/17/24 15:25  Vitals shown include unfiled device data.  Last Pain:  Vitals:   10/17/24 1320  TempSrc:   PainSc: 0-No pain         Complications: No notable events documented.

## 2024-10-17 NOTE — H&P (Signed)
 Post Operative Evaluation      Procedure/Date of Surgery: Left shoulder reverse shoulder arthroplasty 01/24/24   Interval History:    Presents today after a fall directly onto the right side.  She is here today for further discussion.  Her left shoulder has retained good range of motion     PMH/PSH/Family History/Social History/Meds/Allergies:         Past Medical History:  Diagnosis Date   Anxiety      states frequent  anxiety attacks   Asthma     Cancer (HCC)      HX OF THYROID  CANCER    Cold no drainage    since thursday 07-21-12, chest xray done 07-24-2012    COPD (chronic obstructive pulmonary disease) (HCC)      LOV with PFT 1/13  Dr Elsie Sauers on chart-   pt states no changes since last saw pulmonary   Depression     Fibromyalgia      states has had trigger point injections shoulders-    GERD (gastroesophageal reflux disease)     Headache(784.0)      migraines   History of COVID-19 05/2023   History of DVT of lower extremity 1972    right leg   Hyperlipidemia     Hypothyroidism     OA (osteoarthritis)     On home oxygen therapy      2 liters per minute per [place] as needed   Pneumonia      2008   Seasonal allergies      sgtates clear nasal drainage x 3- days- states no fever   Shortness of breath      oxygen at 2 liters at night, WITH EXERTION              Past Surgical History:  Procedure Laterality Date   ABDOMINAL HYSTERECTOMY   1990    bilateral salpingo-oophorectomy   BARTHOLIN GLAND CYST EXCISION   1977    PRIOR MARSIPULIZATION IN 1974   BOWEL SURGERY FOR OBSTRUCTION   1992   CLOSED MANIPULATION RIGHT KNEE REPLACEMENT   09-10-2010   DIAGNOSTIC LAPAROSCOPY   1989    x2-3 with ovarian cystectomy   FINGER ARTHROPLASTY   07/29/2012    Procedure: FINGER ARTHROPLASTY;  Surgeon: Lynwood SHAUNNA Bern, MD;  Location: [facility];  Service: Orthopedics;  Laterality: Left;  Left Thumb, CMC Interpositional Arthroplasty Left Thumb,with Palmaris  Longis Tendon Graft, IPSI Lateral       HEMORRHOID SURGERY   1988   KNEE ARTHROSCOPY   10-26-2008    AND REMOVAL 2 SCREWS PROXIMAL TIBIA  OF RIGHT KNEE   KNEE ARTHROTOMY   LAST ONE 2000    right x 3 prior to replacement   LEFT SHOULDER ARTHROSCOPY   03-02-2012   REVERSE SHOULDER ARTHROPLASTY Left 01/24/2024    Procedure: LEFT REVERSE SHOULDER ARTHROPLASTY;  Surgeon: Genelle Standing, MD;  Location: [facility];  Service: Orthopedics;  Laterality: Left;   THYROID  LOBECTOMY   2009    PARTIAL LEFT LOWER LOBECTOMY   TONSILLECTOMY   1959   TOTAL KNEE ARTHROPLASTY   07-31-2010    OA  RIGHT KNEE   TOTAL KNEE ARTHROPLASTY Left 10/03/2020    Procedure: LEFT TOTAL KNEE ARTHROPLASTY;  Surgeon: Vernetta Lonni GRADE, MD;  Location: [facility];  Service: Orthopedics;  Laterality: Left;        Social History         Socioeconomic History   Marital status: Married  Spouse name: Not on file   Number of children: Not on file   Years of education: Not on file   Highest education level: Not on file  Occupational History   Not on file  Tobacco Use   Smoking status: Former      Current packs/day: 0.00      Average packs/day: 1 pack/day for 43.0 years (43.0 ttl pk-yrs)      Types: Cigarettes      Quit date: 09/23/2021      Years since quitting: 2.8   Smokeless tobacco: Never  Vaping Use   Vaping status: Never Used  Substance and Sexual Activity   Alcohol use: No   Drug use: Yes      Types: Marijuana      Comment: smokes once every 3 weeks   Sexual activity: Not on file  Other Topics Concern   Not on file  Social History Narrative   Not on file    Social Drivers of Health    Financial Resource Strain: Not on file  Food Insecurity: Not on file  Transportation Needs: Not on file  Physical Activity: Not on file  Stress: Not on file  Social Connections: Not on file    No family history on file.     Allergies       Allergies  Allergen Reactions   Codeine Nausea And Vomiting    Doxycycline  Nausea And Vomiting      Pt states she can tolerate this   Penicillins Other (See Comments)      Family cannot take / update 03/02/12 - pt. does not remember the reaction.   Sulfa Antibiotics Nausea And Vomiting   Tylenol  [Acetaminophen ] Other (See Comments)      Cannot take with Neurontin  / update 03/02/12 - causes diarrhea   Vistaril [Hydroxyzine Hcl] Other (See Comments)       Hallucinations            Current Outpatient Medications  Medication Sig Dispense Refill   HYDROcodone -acetaminophen  (NORCO/VICODIN) 5-325 MG tablet Take 1 tablet by mouth every 6 (six) hours as needed for moderate pain (pain score 4-6). 30 tablet 0   albuterol  (PROVENTIL  HFA;VENTOLIN  HFA) 108 (90 BASE) MCG/ACT inhaler Inhale 2 puffs into the lungs every 6 (six) hours as needed for wheezing or shortness of breath.        albuterol  (PROVENTIL ) (2.5 MG/3ML) 0.083% nebulizer solution Take 2.5 mg by nebulization every 6 (six) hours as needed for shortness of breath or wheezing.       Ascorbic Acid  (VITAMIN C ) 1000 MG tablet Take 1,000 mg by mouth daily.       aspirin  325 MG EC tablet TAKE 1 TABLET BY MOUTH TWICE A DAY AFTER MEAL (Patient not taking: Reported on 01/12/2024) 30 tablet 0   atorvastatin  (LIPITOR) 20 MG tablet Take 10 mg by mouth daily before breakfast.        B Complex-C (B-COMPLEX WITH VITAMIN C ) tablet Take 1 tablet by mouth daily.       buPROPion  (WELLBUTRIN  XL) 300 MG 24 hr tablet Take 300 mg by mouth daily.       butalbital -aspirin -caffeine  (FIORINAL) 50-325-40 MG capsule Take 1 capsule by mouth 2 (two) times daily as needed for headache.       cetirizine (ZYRTEC) 10 MG tablet Take 10 mg by mouth daily.       clonazePAM  (KLONOPIN ) 0.5 MG tablet Take 0.25-0.5 mg by mouth in the morning, at noon, and at bedtime.  CREON 36000-114000 units CPEP capsule Take 108,000-144,000 Units by mouth 3 (three) times daily with meals.       escitalopram  (LEXAPRO ) 10 MG tablet Take 10 mg by mouth daily.        estradiol  (ESTRACE ) 0.5 MG tablet Take 0.5 mg by mouth daily.       famotidine (PEPCID) 40 MG tablet Take 40 mg by mouth 2 (two) times daily.       fentaNYL  (DURAGESIC ) 25 MCG/HR Place 1 patch onto the skin every other day.       fluticasone  (FLONASE ) 50 MCG/ACT nasal spray Place 1 spray into both nostrils daily as needed for allergies.        Fluticasone -Umeclidin-Vilant 100-62.5-25 MCG/INH AEPB Inhale 1 puff into the lungs daily.        folic acid (FOLVITE) 800 MCG tablet Take 800 mcg by mouth daily.       gabapentin  (NEURONTIN ) 300 MG capsule Take 600 mg by mouth 2 (two) times daily.       glycopyrrolate  (ROBINUL ) 1 MG tablet Take 1 mg by mouth at bedtime.       HYDROmorphone  (DILAUDID ) 2 MG tablet Take 2 mg by mouth 4 (four) times daily as needed for moderate pain.       HYDROmorphone  (DILAUDID ) 4 MG tablet Take 1 tablet (4 mg total) by mouth every 4 (four) hours as needed for severe pain (pain score 7-10). 30 tablet 0   HYDROmorphone  (DILAUDID ) 4 MG tablet Take 1 tablet (4 mg total) by mouth every 4 (four) hours as needed for severe pain (pain score 7-10) (if oxycodone  not effective). 30 tablet 0   hydroxypropyl methylcellulose / hypromellose (ISOPTO TEARS / GONIOVISC) 2.5 % ophthalmic solution Place 1 drop into both eyes as needed for dry eyes.       levothyroxine  (SYNTHROID , LEVOTHROID) 25 MCG tablet Take 25 mcg by mouth daily before breakfast.       linaclotide (LINZESS) 145 MCG CAPS capsule Take 145 mcg by mouth daily before breakfast.       Multiple Vitamin (MULTIVITAMIN WITH MINERALS) TABS tablet Take 1 tablet by mouth daily.       Multiple Vitamins-Minerals (PRESERVISION AREDS 2 PO) Take 1 capsule by mouth in the morning and at bedtime.       naratriptan (AMERGE) 2.5 MG tablet Take 2.5 mg by mouth as needed for migraine.        NARCAN  4 MG/0.1ML LIQD nasal spray kit Place 0.4 mg into the nose once.        oxyCODONE  (ROXICODONE ) 5 MG immediate release tablet Take 1 tablet (5 mg  total) by mouth every 4 (four) hours as needed for severe pain (pain score 7-10). 30 tablet 0   OXYGEN Inhale 3 L into the lungs as needed (shortness of breath).       rOPINIRole  (REQUIP ) 2 MG tablet Take 4 mg by mouth at bedtime.       traZODone (DESYREL) 50 MG tablet Take 100-150 mg by mouth at bedtime.       valACYclovir  (VALTREX ) 1000 MG tablet Take 1,000 mg by mouth 2 (two) times daily as needed (outbreak).        verapamil  (CALAN -SR) 120 MG CR tablet Take 120 mg by mouth daily.       vitamin E 180 MG (400 UNITS) capsule Take 400 Units by mouth daily.          No current facility-administered medications for this visit.      Imaging  Results (Last 48 hours)  No results found.     Review of Systems:   A ROS was performed including pertinent positives and negatives as documented in the HPI.     Musculoskeletal Exam:     There were no vitals taken for this visit.   Left shoulder incision is well-appearing without erythema or drainage.  Active forward elevation is to 80 degrees.  External rotation at side is to 20 degrees.  Internal rotation deferred today   Imaging:     2 views left shoulder: Status post reverse shoulder arthroplasty without evidence of complication   I personally reviewed and interpreted the radiographs.     Assessment:   Status post reverse shoulder arthroplasty without evidence of complication.  Overall her range of motion is improving nicely.  With regard to the right shoulder I did discuss that she does still have persistent osteoarthritis in the right shoulder.  She will return to clinic in 12 months following the resolution of her right shoulder bone bruise and we will talk about possible arthroplasty at that time Plan :     -Plan for right shoulder reverse shoulder arthroplasty   After a lengthy discussion of treatment options, including risks, benefits, alternatives, complications of surgical and nonsurgical conservative options, the patient elected  surgical repair.   The patient  is aware of the material risks  and complications including, but not limited to injury to adjacent structures, neurovascular injury, infection, numbness, bleeding, implant failure, thermal burns, stiffness, persistent pain, failure to heal, disease transmission from allograft, need for further surgery, dislocation, anesthetic risks, blood clots, risks of death,and others. The probabilities of surgical success and failure discussed with patient given their particular co-morbidities.The time and nature of expected rehabilitation and recovery was discussed.The patient's questions were all answered preoperatively.  No barriers to understanding were noted. I explained the natural history of the disease process and Rx rationale.  I explained to the patient what I considered to be reasonable expectations given their personal situation.  The final treatment plan was arrived at through a shared patient decision making process model.

## 2024-10-17 NOTE — Anesthesia Procedure Notes (Signed)
 Anesthesia Regional Block: Interscalene brachial plexus block   Pre-Anesthetic Checklist: , timeout performed,  Correct Patient, Correct Site, Correct Laterality,  Correct Procedure, Correct Position, site marked,  Risks and benefits discussed,  Surgical consent,  Pre-op evaluation,  At surgeon's request and post-op pain management  Laterality: Upper and Right  Prep: chloraprep       Needles:  Injection technique: Single-shot  Needle Type: Stimulator Needle - 40     Needle Length: 4cm  Needle Gauge: 22     Additional Needles:   Procedures:,,,, ultrasound used (permanent image in chart),,    Narrative:  Start time: 10/17/2024 12:40 PM End time: 10/17/2024 1:00 PM Injection made incrementally with aspirations every 5 mL.  Performed by: Personally  Anesthesiologist: Darlyn Rush, MD  Additional Notes: BP cuff, SpO2 and EKG monitors applied. Sedation begun. Nerve location verified with ultrasound. Anesthetic injected incrementally, slowly, and after neg aspirations under direct u/s guidance. Good perineural spread. Tolerated well.

## 2024-10-17 NOTE — Anesthesia Procedure Notes (Addendum)
 Procedure Name: Intubation Date/Time: 10/17/2024 1:58 PM  Performed by: Lanning Cena RAMAN, CRNAPre-anesthesia Checklist: Patient identified, Emergency Drugs available, Suction available, Patient being monitored and Timeout performed Patient Re-evaluated:Patient Re-evaluated prior to induction Oxygen Delivery Method: Circle system utilized Preoxygenation: Pre-oxygenation with 100% oxygen Induction Type: IV induction Ventilation: Mask ventilation without difficulty Laryngoscope Size: Mac and 3 Grade View: Grade I Tube type: Oral Tube size: 7.0 mm Airway Equipment and Method: Stylet Placement Confirmation: ETT inserted through vocal cords under direct vision, positive ETCO2 and breath sounds checked- equal and bilateral Secured at: 20 cm Tube secured with: Tape Dental Injury: Teeth and Oropharynx as per pre-operative assessment

## 2024-10-17 NOTE — Op Note (Signed)
 Date of Surgery: 10/17/2024  INDICATIONS: [name] is a 73 y.o.-year-old female with right shoulder rotator cuff arthropathy.  The risk and benefits of the procedure were discussed in detail and documented in the pre-operative evaluation.   PREOPERATIVE DIAGNOSIS: 1. Right shoulder rotator cuff arthropathy  POSTOPERATIVE DIAGNOSIS: Same.  PROCEDURE: 1. Right shoulder reverse shoulder arthroplasty 2. Right shoulder biceps tenodesis  SURGEON: Elspeth LITTIE Parker MD  ASSISTANT: Conley Dawson, ATC  ANESTHESIA:  general  IV FLUIDS AND URINE: See anesthesia record.  ANTIBIOTICS: Ancef   ESTIMATED BLOOD LOSS: 10 mL.  IMPLANTS:  Implant Name Type Inv. Item Serial No. Manufacturer Lot No. LRB No. Used Action  SCREW BONE INTRNL SM 7 - [id number] Screw SCREW BONE INTRNL SM 7 [id number] [employer]  Right 1 Implanted  AUGMENT BASEPLATE 15DEG 25 WDG - [id number] Joint AUGMENT BASEPLATE 15DEG 25 WDG [id number] [employer]  Right 1 Implanted  SCREW 5.0X18 - [id number] Screw SCREW 5.0X18  [employer]  Right 1 Implanted  SCREW PERIPHERAL 30 - [id number] Screw SCREW PERIPHERAL 30  [employer]  Right 3 Implanted  GLENOSPHERE REV SHOULDER 36 - [id number] Joint GLENOSPHERE REV SHOULDER 36 [id number] [employer]  Right 1 Implanted  STEM HUMERAL PLUS LONG 1+ - [id number] Orthopedic Implant STEM HUMERAL PLUS LONG 1+ [id number] [employer]  Right 1 Implanted  CUP HUM SYS INSERT SZ 1/2 36 - [id number] Joint CUP HUM SYS INSERT SZ 1/2 36 [id number] [employer]  Right 1 Implanted    DRAINS: None  CULTURES: None  COMPLICATIONS: none  DESCRIPTION OF PROCEDURE:   Patient was identified in the preoperative holding area.  Anesthesia performed an interscalene nerve block after universal timeout was performed with nursing.  Ancef  was given 1 hour prior to skin incision.    The surgical site was scrubbed with a chlorhexidine  scrub brush and alcohol.  The patient was then prepped with chlorhexidine  skin prep.  The  patient was subsequently taken back to the operating room.  Anesthesia was induced. The patient was transferred to the beachchair position.  All bony prominences were padded.  Final timeout was again performed.     The bony landmarks of the shoulder were marked with a marking pen. A delto-pectoral incision was made, extending up approximately 5 inches. The wound with then irrigated with dilute betadine . Cephalic vein was identified, and an protected. This was retracted medially. Subdeltoid and subpectoral lesions were released. Neurovascular structures were carefully protected. The Gelpi retractor was used to retract the deltoid and pectoralis major.    The deltoid was retracted laterally with a Brown humeral retractor.  The conjoined tendon was identified. The cleido-pectoral fascia was excised.  The axillary nerve was palpated and carefully protected throughout the procedure. The biceps tendon was found and tenodesed to the upper pec with # 2 Ethibond non-absorbable suture.  Proximally the biceps tendon was removed up to the joint.  The bicipital groove was used for a landmark to establish rotator cuff interval. The subscap was tagged with a #2 Ethibond.  At this point the subscapularis was peeled off from the lesser tuberosity with care to avoid dissection distally in order to protect the axillary nerve.  Once the joint was exposed the proximal humerus was delivered with external rotation and extension of the arm. The humerus was prepped initially by performing a humeral neck cut. This was done with the guide using 30 degrees of retroversion as a reference.  The head portion was removed.  A medullary sounding reamer was  then used.  We subsequently placed our guidewire through the center of the humeral head using the reference guide.  This was a size 1.  Metaphyseal reamer was then used.  Finally the size 1 long plus broach was malleted into place with excellent purchase.  A tonsil clamp was used to attempt  to pull this out with very good purchase   Attention was then turned to the glenoid.  Posteriorly a large Darach retractor was used.  A 360 Degree release of the subscapularis and glenoid were done. The capsule was released from the humerus.    Glenoid retractors were placed posteriorly, superiorly behind the biceps tendon and anteriorly on the glenoid neck. A 360-degree release of the capsule was performed with cautery.  The triceps was released off the inferior tubercle of the glenoid. The axillary nerve was carefully protected with the surgeon's index finger, retracting it and using cautery.   A guidepin was placed through the glenoid guide. The guidepin was drilled until it exited the cortex. The guidepin was over drilled. Next, the glenoid was prepared with the reamer down to cortical bone.  The central peg hole was totally within the scapular neck tested with the probe.  The baseplate was then placed screwed securely with good purchase in position and then secured with 4 screws. In each case, they were drilled and measured and the appropriate length screw placed with excellent rigid fixation of the baseplate. The glenosphere was placed with size based based on pre-operative templating.   The humerus was then delivered and a neutral polyethylene trial was placed.  This was brought to just the level of the reduction but not completely reduced.  A 0 retentive final poly was selected and impacted.    Appropriate tension was noted on the conjoined tendon and deltoid muscle.  Extension was stable, external and internal rotation as well.  The subscap was pulled over but as this was not able to reach comfortably decision was made not to repair in order to prevent limited in external rotation.  The wound was then irrigated. Vancomycin  powder was placed in the wound again for infection prevention.   The wound was then closed in layers with 0 Vicryl interrupted in the deep subcu followed by 2-0 Vicryl in the  superficial subcu and 3-0 nylon for skin.  An Aquacel dressing was applied as well as an Veterinary surgeon.  A shoulder immobilizer was applied.     Postoperative Plan: -The patient will begin the reverse shoulder rehab protocol  -Aspirin  325 mg daily will be used for 4 weeks for blood clot prevention -I will see the patient back in 2 weeks for first postoperative wound check    Elspeth LITTIE Parker, MD 2:56 PM

## 2024-10-17 NOTE — Brief Op Note (Signed)
   Brief Op Note  Date of Surgery: 10/17/2024  Preoperative Diagnosis: RIGHT ROTATOR CUFF ARTHROPATHY  Postoperative Diagnosis: same  Procedure: Procedure(s): ARTHROPLASTY, SHOULDER, TOTAL, REVERSE  Implants: Implant Name Type Inv. Item Serial No. Manufacturer Lot No. LRB No. Used Action  SCREW BONE INTRNL SM 7 - [id number] Screw SCREW BONE INTRNL SM 7 [id number] [employer]  Right 1 Implanted  AUGMENT BASEPLATE 15DEG 25 WDG - [id number] Joint AUGMENT BASEPLATE 15DEG 25 WDG [id number] [employer]  Right 1 Implanted  SCREW 5.0X18 - [id number] Screw SCREW 5.0X18  [employer]  Right 1 Implanted  SCREW PERIPHERAL 30 - [id number] Screw SCREW PERIPHERAL 30  [employer]  Right 3 Implanted  GLENOSPHERE REV SHOULDER 36 - [id number] Joint GLENOSPHERE REV SHOULDER 36 [id number] [employer]  Right 1 Implanted  STEM HUMERAL PLUS LONG 1+ - [id number] Orthopedic Implant STEM HUMERAL PLUS LONG 1+ [id number] [employer]  Right 1 Implanted  CUP HUM SYS INSERT SZ 1/2 36 - [id number] Joint CUP HUM SYS INSERT SZ 1/2 36 [id number] [employer]  Right 1 Implanted    Surgeons: Surgeon(s): Genelle Standing, MD  Anesthesia: General    Estimated Blood Loss: See anesthesia record  Complications: None  Condition to PACU: Stable  Standing LITTIE Genelle, MD 10/17/2024 2:56 PM

## 2024-10-18 ENCOUNTER — Telehealth (HOSPITAL_BASED_OUTPATIENT_CLINIC_OR_DEPARTMENT_OTHER): Payer: Self-pay | Admitting: Orthopaedic Surgery

## 2024-10-18 DIAGNOSIS — M12811 Other specific arthropathies, not elsewhere classified, right shoulder: Secondary | ICD-10-CM

## 2024-10-18 NOTE — Telephone Encounter (Signed)
 PA completed on [employer] 10:11am 10/18/2024

## 2024-10-18 NOTE — Telephone Encounter (Signed)
 Pre-op

## 2024-10-19 NOTE — Anesthesia Postprocedure Evaluation (Signed)
 Anesthesia Post Note  Patient: [name]  Procedure(s) Performed: ARTHROPLASTY, SHOULDER, TOTAL, REVERSE (Right: Shoulder)     Patient location during evaluation: PACU Anesthesia Type: General Level of consciousness: sedated and patient cooperative Pain management: pain level controlled Vital Signs Assessment: post-procedure vital signs reviewed and stable Respiratory status: spontaneous breathing Cardiovascular status: stable Anesthetic complications: no   No notable events documented.  Last Vitals:  Vitals:   10/17/24 1615 10/17/24 1630  BP: 104/66 105/65  Pulse: 66 70  Resp: 10 14  Temp:  36.5 C  SpO2: 96% 96%    Last Pain:  Vitals:   10/17/24 1630  TempSrc:   PainSc: 0-No pain                 Norleen Pope

## 2024-10-23 ENCOUNTER — Telehealth: Payer: Self-pay | Admitting: Orthopaedic Surgery

## 2024-10-23 ENCOUNTER — Encounter (HOSPITAL_COMMUNITY): Payer: Self-pay | Admitting: Orthopaedic Surgery

## 2024-10-23 NOTE — Telephone Encounter (Signed)
 Referral was placed on the 26th, will work on it shortly.

## 2024-10-23 NOTE — Telephone Encounter (Signed)
 [name] from [facility] called and said they are waiting on her referral. [phone]

## 2024-10-24 NOTE — Telephone Encounter (Signed)
 [facility] with [facility] and order has been faxed already

## 2024-10-26 ENCOUNTER — Telehealth (HOSPITAL_BASED_OUTPATIENT_CLINIC_OR_DEPARTMENT_OTHER): Payer: Self-pay | Admitting: Orthopaedic Surgery

## 2024-10-26 ENCOUNTER — Other Ambulatory Visit (HOSPITAL_BASED_OUTPATIENT_CLINIC_OR_DEPARTMENT_OTHER): Payer: Self-pay | Admitting: Orthopaedic Surgery

## 2024-10-26 MED ORDER — HYDROMORPHONE HCL 4 MG PO TABS: 4.0000 mg | ORAL_TABLET | ORAL | 0 refills | Status: DC | PRN

## 2024-10-26 NOTE — Telephone Encounter (Signed)
 meds

## 2024-11-01 ENCOUNTER — Ambulatory Visit (INDEPENDENT_AMBULATORY_CARE_PROVIDER_SITE_OTHER)

## 2024-11-01 ENCOUNTER — Ambulatory Visit (INDEPENDENT_AMBULATORY_CARE_PROVIDER_SITE_OTHER): Admitting: Orthopaedic Surgery

## 2024-11-01 DIAGNOSIS — M79601 Pain in right arm: Secondary | ICD-10-CM

## 2024-11-01 DIAGNOSIS — M12811 Other specific arthropathies, not elsewhere classified, right shoulder: Secondary | ICD-10-CM

## 2024-11-01 NOTE — Progress Notes (Signed)
 Post Operative Evaluation    Procedure/Date of Surgery: Right shoulder reverse shoulder arthroplasty 11/25  Interval History:    Presents today for 2-week follow-up status post right shoulder reverse shoulder arthroplasty overall doing extremely well   PMH/PSH/Family History/Social History/Meds/Allergies:    Past Medical History:  Diagnosis Date   Anxiety    states frequent  anxiety attacks   Asthma    Cancer (HCC)    HX OF THYROID  CANCER    Cold no drainage   since thursday 07-21-12, chest xray done 07-24-2012    COPD (chronic obstructive pulmonary disease) (HCC)    LOV with PFT 1/13  Dr Elsie Sauers on chart-   pt states no changes since last saw pulmonary   Depression    Fibromyalgia    states has had trigger point injections shoulders-    GERD (gastroesophageal reflux disease)    Headache(784.0)    migraines   History of COVID-19 05/2023   History of DVT of lower extremity 1972   right leg   Hyperlipidemia    Hypertension    pt denies   Hypothyroidism    OA (osteoarthritis)    On home oxygen therapy    2 liters per minute per [place] as needed   Pneumonia    2008   Scoliosis    Seasonal allergies    sgtates clear nasal drainage x 3- days- states no fever   Shortness of breath    oxygen at 2 liters at night, WITH EXERTION    Past Surgical History:  Procedure Laterality Date   ABDOMINAL HYSTERECTOMY  1990   bilateral salpingo-oophorectomy   BARTHOLIN GLAND CYST EXCISION  1977   PRIOR MARSIPULIZATION IN 1974   BOWEL SURGERY FOR OBSTRUCTION  1992   CLOSED MANIPULATION RIGHT KNEE REPLACEMENT  09-10-2010   DIAGNOSTIC LAPAROSCOPY  1989   x2-3 with ovarian cystectomy   FINGER ARTHROPLASTY  07/29/2012   Procedure: FINGER ARTHROPLASTY;  Surgeon: Lynwood SHAUNNA Bern, MD;  Location: [facility];  Service: Orthopedics;  Laterality: Left;  Left Thumb, CMC Interpositional Arthroplasty Left Thumb,with Palmaris Longis Tendon Graft, IPSI Lateral       HEMORRHOID SURGERY  1988   KNEE ARTHROSCOPY  10-26-2008   AND REMOVAL 2 SCREWS PROXIMAL TIBIA  OF RIGHT KNEE   KNEE ARTHROTOMY  LAST ONE 2000   right x 3 prior to replacement   LEFT SHOULDER ARTHROSCOPY  03-02-2012   REVERSE SHOULDER ARTHROPLASTY Left 01/24/2024   Procedure: LEFT REVERSE SHOULDER ARTHROPLASTY;  Surgeon: Genelle Standing, MD;  Location: [facility];  Service: Orthopedics;  Laterality: Left;   REVERSE SHOULDER ARTHROPLASTY Right 10/17/2024   Procedure: ARTHROPLASTY, SHOULDER, TOTAL, REVERSE;  Surgeon: Genelle Standing, MD;  Location: [facility];  Service: Orthopedics;  Laterality: Right;   THYROID  LOBECTOMY  2009   PARTIAL LEFT LOWER LOBECTOMY   TONSILLECTOMY  1959   TOTAL KNEE ARTHROPLASTY  07-31-2010   OA  RIGHT KNEE   TOTAL KNEE ARTHROPLASTY Left 10/03/2020   Procedure: LEFT TOTAL KNEE ARTHROPLASTY;  Surgeon: Vernetta Lonni GRADE, MD;  Location: [facility];  Service: Orthopedics;  Laterality: Left;   Social History   Socioeconomic History   Marital status: Married    Spouse name: Not on file   Number of children: Not on file   Years of education: Not on file  Highest education level: Not on file  Occupational History   Not on file  Tobacco Use   Smoking status: Some Days    Current packs/day: 0.00    Average packs/day: 1 pack/day for 43.0 years (43.0 ttl pk-yrs)    Types: Cigarettes    Last attempt to quit: 09/23/2021    Years since quitting: 3.1   Smokeless tobacco: Never  Vaping Use   Vaping status: Never Used  Substance and Sexual Activity   Alcohol use: No   Drug use: Yes    Types: Marijuana    Comment: last smoked around 09/29/24   Sexual activity: Not on file  Other Topics Concern   Not on file  Social History Narrative   Not on file   Social Drivers of Health   Financial Resource Strain: Not on file  Food Insecurity: Not on file  Transportation Needs: Not on file  Physical Activity: Not on file  Stress: Not on file  Social Connections: Not on file    No family history on file. Allergies  Allergen Reactions   Amitriptyline     Other Reaction(s): Unknown   Codeine Nausea And Vomiting   Doxycycline  Nausea And Vomiting    Pt states she can tolerate this   Imipramine Other (See Comments)   Lortab [Hydrocodone -Acetaminophen ] Other (See Comments)    Messed up digestive system   Metronidazole Other (See Comments)   Penicillins Other (See Comments)    Family cannot take / update 03/02/12 - pt. does not remember the reaction.   Pregabalin     Other Reaction(s): Unknown   Sulfa Antibiotics Nausea And Vomiting   Tylenol  [Acetaminophen ] Other (See Comments)    Cannot take with Neurontin  / update 03/02/12 - causes diarrhea   Vistaril [Hydroxyzine Hcl] Other (See Comments)     Hallucinations   Current Outpatient Medications  Medication Sig Dispense Refill   albuterol  (PROVENTIL  HFA;VENTOLIN  HFA) 108 (90 BASE) MCG/ACT inhaler Inhale 2 puffs into the lungs every 6 (six) hours as needed for wheezing or shortness of breath.      albuterol  (PROVENTIL ) (2.5 MG/3ML) 0.083% nebulizer solution Take 2.5 mg by nebulization every 6 (six) hours as needed for shortness of breath or wheezing.     Ascorbic Acid  (VITAMIN C ) 1000 MG tablet Take 1,000 mg by mouth daily.     aspirin  325 MG EC tablet TAKE 1 TABLET BY MOUTH TWICE A DAY AFTER MEAL (Patient taking differently: Take 325 mg by mouth daily as needed for mild pain (pain score 1-3) or moderate pain (pain score 4-6).) 30 tablet 0   aspirin  EC 325 MG tablet Take 1 tablet (325 mg total) by mouth daily. 30 tablet 0   atorvastatin  (LIPITOR) 20 MG tablet Take 10 mg by mouth daily before breakfast.      Azelastine HCl 137 MCG/SPRAY SOLN Place 2 sprays into both nostrils 2 (two) times daily.     B Complex-C (B-COMPLEX WITH VITAMIN C ) tablet Take 1 tablet by mouth daily. (Patient not taking: Reported on 10/11/2024)     buPROPion  (WELLBUTRIN  XL) 300 MG 24 hr tablet Take 300 mg by mouth daily.      butalbital -aspirin -caffeine  (FIORINAL) 50-325-40 MG capsule Take 1 capsule by mouth daily as needed for headache.     Calcium  Carb-Cholecalciferol (CALCIUM  + D3 PO) Take 1 tablet by mouth daily at 12 noon.     cetirizine (ZYRTEC) 10 MG tablet Take 10 mg by mouth daily.     Cholecalciferol (VITAMIN D-3) 125  MCG (5000 UT) TABS Take 1 tablet by mouth daily at 12 noon.     clonazePAM  (KLONOPIN ) 0.5 MG tablet Take 2.5 tablets by mouth daily as needed for anxiety.     CREON 36000-114000 units CPEP capsule Take 108,000-144,000 Units by mouth See admin instructions. 108,000 with snacks and 144,000 with meals     escitalopram  (LEXAPRO ) 10 MG tablet Take 10 mg by mouth daily.     estradiol  (ESTRACE ) 0.5 MG tablet Take 0.5 mg by mouth daily.     famotidine (PEPCID) 40 MG tablet Take 40 mg by mouth 2 (two) times daily.     fentaNYL  (DURAGESIC ) 25 MCG/HR Place 1 patch onto the skin every other day.     fluticasone  (FLONASE ) 50 MCG/ACT nasal spray Place 1 spray into both nostrils daily as needed for allergies.  (Patient not taking: Reported on 10/11/2024)     folic acid (FOLVITE) 800 MCG tablet Take 800 mcg by mouth daily.     gabapentin  (NEURONTIN ) 300 MG capsule Take 600 mg by mouth every morning. Sometimes takes 1 capsule in the afternoon     glycopyrrolate  (ROBINUL ) 1 MG tablet Take 1 mg by mouth at bedtime.     HYDROmorphone  (DILAUDID ) 2 MG tablet Take 2 mg by mouth 4 (four) times daily as needed for moderate pain.     HYDROmorphone  (DILAUDID ) 4 MG tablet Take 1 tablet (4 mg total) by mouth every 4 (four) hours as needed for severe pain (pain score 7-10). (Patient not taking: Reported on 10/11/2024) 30 tablet 0   HYDROmorphone  (DILAUDID ) 4 MG tablet Take 1 tablet (4 mg total) by mouth every 4 (four) hours as needed for severe pain (pain score 7-10) (if oxycodone  not effective). (Patient not taking: Reported on 10/11/2024) 30 tablet 0   HYDROmorphone  (DILAUDID ) 4 MG tablet Take 1 tablet (4 mg total) by mouth  every 4 (four) hours as needed for severe pain (pain score 7-10). 60 tablet 0   HYDROmorphone  (DILAUDID ) 4 MG tablet Take 1 tablet (4 mg total) by mouth every 4 (four) hours as needed for severe pain (pain score 7-10). 40 tablet 0   levothyroxine  (SYNTHROID , LEVOTHROID) 25 MCG tablet Take 25 mcg by mouth daily before breakfast.     linaclotide (LINZESS) 145 MCG CAPS capsule Take 145 mcg by mouth daily before breakfast.     Multiple Vitamin (MULTIVITAMIN WITH MINERALS) TABS tablet Take 1 tablet by mouth daily.     Multiple Vitamins-Minerals (PRESERVISION AREDS 2 PO) Take 1 capsule by mouth in the morning and at bedtime.     naratriptan (AMERGE) 2.5 MG tablet Take 2.5 mg by mouth as needed for migraine.      NARCAN  4 MG/0.1ML LIQD nasal spray kit Place 0.4 mg into the nose once.  (Patient not taking: Reported on 10/11/2024)     OXYGEN Inhale 2 L into the lungs as needed (shortness of breath).     Polyethyl Glycol-Propyl Glycol (SYSTANE OP) Place 1 drop into both eyes daily as needed (dry eyes).     rOPINIRole  (REQUIP ) 2 MG tablet Take 4 mg by mouth at bedtime.     traZODone (DESYREL) 50 MG tablet Take 100-150 mg by mouth at bedtime.     TRELEGY ELLIPTA 200-62.5-25 MCG/ACT AEPB Inhale 1 puff into the lungs daily.     verapamil  (CALAN -SR) 120 MG CR tablet Take 120 mg by mouth at bedtime.     vitamin E 180 MG (400 UNITS) capsule Take 400 Units by mouth daily.  No current facility-administered medications for this visit.   No results found.  Review of Systems:   A ROS was performed including pertinent positives and negatives as documented in the HPI.   Musculoskeletal Exam:    There were no vitals taken for this visit.  Right shoulder incision is well-appearing without erythema or drainage.  Active forward elevation is to 80 degrees.  External rotation at side is to 20 degrees.  Internal rotation deferred today  Imaging:    2 views right shoulder: Status post reverse shoulder  arthroplasty without evidence of complication  I personally reviewed and interpreted the radiographs.   Assessment:   Status post reverse shoulder arthroplasty without evidence of complication.  Overall her range of motion is improving nicely.  At this time she is doing extremely well.  She will continue to follow the reverse protocol and I will see her back in 1 month for reassessment Plan :    - Return to clinic 1 month for reassessment      I personally saw and evaluated the patient, and participated in the management and treatment plan.  Elspeth Parker, MD Attending Physician, Orthopedic Surgery  This document was dictated using Dragon voice recognition software. A reasonable attempt at proof reading has been made to minimize errors.

## 2024-11-06 ENCOUNTER — Telehealth: Payer: Self-pay | Admitting: Orthopaedic Surgery

## 2024-11-06 NOTE — Telephone Encounter (Signed)
 Pt called and needs a refill on pain medication. CB#[phone]

## 2024-11-07 ENCOUNTER — Other Ambulatory Visit: Payer: Self-pay | Admitting: Orthopaedic Surgery

## 2024-11-07 MED ORDER — HYDROMORPHONE HCL 4 MG PO TABS: 4.0000 mg | ORAL_TABLET | ORAL | 0 refills | Status: AC | PRN

## 2024-11-17 ENCOUNTER — Telehealth: Payer: Self-pay | Admitting: Orthopedic Surgery

## 2024-11-17 MED ORDER — HYDROMORPHONE HCL 4 MG PO TABS: 4.0000 mg | ORAL_TABLET | Freq: Four times a day (QID) | ORAL | 0 refills | Status: DC | PRN

## 2024-11-17 NOTE — Telephone Encounter (Signed)
 Orthopedic Telephone Note  Patient's husband called the on-call phone number today.  He states that his wife is out of her Dilaudid  which had been prescribed to her by Dr. Genelle on as a part of her pain control postoperatively after shoulder replacement surgery.  He said that they recently went to her pain management who stated that Dr. B will be in charge of her postoperative pain for 90 days after surgery.  I did review the PDMP data and it looks like she did get a prescription for Dilaudid  on 12/22. Dr. B is out of office and I am covering. I sent in a short course of dilaudid  at the same dosage that Dr. B had been prescribing for her. I will defer to him to figure out her opioid management going forward when he is back in office.    Samantha DELENA Ada, MD Orthopedic Surgeon

## 2024-11-22 ENCOUNTER — Other Ambulatory Visit (HOSPITAL_BASED_OUTPATIENT_CLINIC_OR_DEPARTMENT_OTHER): Payer: Self-pay | Admitting: Student

## 2024-11-22 ENCOUNTER — Telehealth (HOSPITAL_BASED_OUTPATIENT_CLINIC_OR_DEPARTMENT_OTHER): Payer: Self-pay | Admitting: Orthopaedic Surgery

## 2024-11-22 MED ORDER — HYDROMORPHONE HCL 4 MG PO TABS: 4.0000 mg | ORAL_TABLET | Freq: Four times a day (QID) | ORAL | 0 refills | Status: AC | PRN

## 2024-11-22 NOTE — Telephone Encounter (Signed)
 Patient needs to refill on pain meds she is not sure what she is taking

## 2024-11-23 ENCOUNTER — Other Ambulatory Visit: Payer: Self-pay | Admitting: Orthopaedic Surgery

## 2024-11-23 MED ORDER — HYDROMORPHONE HCL 4 MG PO TABS: 4.0000 mg | ORAL_TABLET | ORAL | 0 refills | Status: AC | PRN

## 2024-11-27 ENCOUNTER — Telehealth: Payer: Self-pay | Admitting: Orthopedic Surgery

## 2024-11-27 NOTE — Telephone Encounter (Signed)
 Mr. Nazario is calling for his wife, Angla. He states that she needs a refill of pain medication called into [facility], [address] In [place].  He said that Leonce was able to give her a little to get her through, but she is out.  Call back # is [phone].

## 2024-12-06 ENCOUNTER — Ambulatory Visit (INDEPENDENT_AMBULATORY_CARE_PROVIDER_SITE_OTHER): Admitting: Orthopaedic Surgery

## 2024-12-06 DIAGNOSIS — M12811 Other specific arthropathies, not elsewhere classified, right shoulder: Secondary | ICD-10-CM

## 2024-12-06 NOTE — Progress Notes (Signed)
 "                                Post Operative Evaluation    Procedure/Date of Surgery: Right shoulder reverse shoulder arthroplasty 11/25  Interval History:    Presents today for 6-week follow-up status post right shoulder arthroplasty.  She is continuing to have pain which is overall slowly improving.  She is here today for her 6-week follow-up.  Overhead range of motion is improving nicely  PMH/PSH/Family History/Social History/Meds/Allergies:    Past Medical History:  Diagnosis Date   Anxiety    states frequent  anxiety attacks   Asthma    Cancer (HCC)    HX OF THYROID  CANCER    Cold no drainage   since thursday 07-21-12, chest xray done 07-24-2012    COPD (chronic obstructive pulmonary disease) (HCC)    LOV with PFT 1/13  Dr Elsie Sauers on chart-   pt states no changes since last saw pulmonary   Depression    Fibromyalgia    states has had trigger point injections shoulders-    GERD (gastroesophageal reflux disease)    Headache(784.0)    migraines   History of COVID-19 05/2023   History of DVT of lower extremity 1972   right leg   Hyperlipidemia    Hypertension    pt denies   Hypothyroidism    OA (osteoarthritis)    On home oxygen therapy    2 liters per minute per [place] as needed   Pneumonia    2008   Scoliosis    Seasonal allergies    sgtates clear nasal drainage x 3- days- states no fever   Shortness of breath    oxygen at 2 liters at night, WITH EXERTION    Past Surgical History:  Procedure Laterality Date   ABDOMINAL HYSTERECTOMY  1990   bilateral salpingo-oophorectomy   BARTHOLIN GLAND CYST EXCISION  1977   PRIOR MARSIPULIZATION IN 1974   BOWEL SURGERY FOR OBSTRUCTION  1992   CLOSED MANIPULATION RIGHT KNEE REPLACEMENT  09-10-2010   DIAGNOSTIC LAPAROSCOPY  1989   x2-3 with ovarian cystectomy   FINGER ARTHROPLASTY  07/29/2012   Procedure: FINGER ARTHROPLASTY;  Surgeon: Lynwood SHAUNNA Bern, MD;  Location: [facility];  Service: Orthopedics;  Laterality:  Left;  Left Thumb, CMC Interpositional Arthroplasty Left Thumb,with Palmaris Longis Tendon Graft, IPSI Lateral      HEMORRHOID SURGERY  1988   KNEE ARTHROSCOPY  10-26-2008   AND REMOVAL 2 SCREWS PROXIMAL TIBIA  OF RIGHT KNEE   KNEE ARTHROTOMY  LAST ONE 2000   right x 3 prior to replacement   LEFT SHOULDER ARTHROSCOPY  03-02-2012   REVERSE SHOULDER ARTHROPLASTY Left 01/24/2024   Procedure: LEFT REVERSE SHOULDER ARTHROPLASTY;  Surgeon: Genelle Standing, MD;  Location: [facility];  Service: Orthopedics;  Laterality: Left;   REVERSE SHOULDER ARTHROPLASTY Right 10/17/2024   Procedure: ARTHROPLASTY, SHOULDER, TOTAL, REVERSE;  Surgeon: Genelle Standing, MD;  Location: [facility];  Service: Orthopedics;  Laterality: Right;   THYROID  LOBECTOMY  2009   PARTIAL LEFT LOWER LOBECTOMY   TONSILLECTOMY  1959   TOTAL KNEE ARTHROPLASTY  07-31-2010   OA  RIGHT KNEE   TOTAL KNEE ARTHROPLASTY Left 10/03/2020   Procedure: LEFT TOTAL KNEE ARTHROPLASTY;  Surgeon: Vernetta Lonni GRADE, MD;  Location: [facility];  Service: Orthopedics;  Laterality: Left;   Social History   Socioeconomic History   Marital status: Married  Spouse name: Not on file   Number of children: Not on file   Years of education: Not on file   Highest education level: Not on file  Occupational History   Not on file  Tobacco Use   Smoking status: Some Days    Current packs/day: 0.00    Average packs/day: 1 pack/day for 43.0 years (43.0 ttl pk-yrs)    Types: Cigarettes    Last attempt to quit: 09/23/2021    Years since quitting: 3.2   Smokeless tobacco: Never  Vaping Use   Vaping status: Never Used  Substance and Sexual Activity   Alcohol use: No   Drug use: Yes    Types: Marijuana    Comment: last smoked around 09/29/24   Sexual activity: Not on file  Other Topics Concern   Not on file  Social History Narrative   Not on file   Social Drivers of Health   Tobacco Use: Medium Risk (11/06/2024)   Received from [facility]   Patient History    Smoking Tobacco Use: Former    Smokeless Tobacco Use: Never    Passive Exposure: Not on [place] Strain: Not on file  Food Insecurity: Not on file  Transportation Needs: Not on file  Physical Activity: Not on file  Stress: Not on file  Social Connections: Not on file  Depression (EYV7-0): Not on file  Alcohol Screen: Not on file  Housing: Not on file  Utilities: Not on file  Health Literacy: Not on file   No family history on file. Allergies  Allergen Reactions   Amitriptyline     Other Reaction(s): Unknown   Codeine Nausea And Vomiting   Doxycycline  Nausea And Vomiting    Pt states she can tolerate this   Imipramine Other (See Comments)   Lortab [Hydrocodone -Acetaminophen ] Other (See Comments)    Messed up digestive system   Metronidazole Other (See Comments)   Penicillins Other (See Comments)    Family cannot take / update 03/02/12 - pt. does not remember the reaction.   Pregabalin     Other Reaction(s): Unknown   Sulfa Antibiotics Nausea And Vomiting   Tylenol  [Acetaminophen ] Other (See Comments)    Cannot take with Neurontin  / update 03/02/12 - causes diarrhea   Vistaril [Hydroxyzine Hcl] Other (See Comments)     Hallucinations   Current Outpatient Medications  Medication Sig Dispense Refill   albuterol  (PROVENTIL  HFA;VENTOLIN  HFA) 108 (90 BASE) MCG/ACT inhaler Inhale 2 puffs into the lungs every 6 (six) hours as needed for wheezing or shortness of breath.      albuterol  (PROVENTIL ) (2.5 MG/3ML) 0.083% nebulizer solution Take 2.5 mg by nebulization every 6 (six) hours as needed for shortness of breath or wheezing.     Ascorbic Acid  (VITAMIN C ) 1000 MG tablet Take 1,000 mg by mouth daily.     aspirin  325 MG EC tablet TAKE 1 TABLET BY MOUTH TWICE A DAY AFTER MEAL (Patient taking differently: Take 325 mg by mouth daily as needed for mild pain (pain score 1-3) or moderate pain (pain score 4-6).) 30 tablet 0   aspirin  EC 325 MG  tablet Take 1 tablet (325 mg total) by mouth daily. 30 tablet 0   atorvastatin  (LIPITOR) 20 MG tablet Take 10 mg by mouth daily before breakfast.      Azelastine HCl 137 MCG/SPRAY SOLN Place 2 sprays into both nostrils 2 (two) times daily.     B Complex-C (B-COMPLEX WITH VITAMIN C ) tablet  Take 1 tablet by mouth daily. (Patient not taking: Reported on 10/11/2024)     buPROPion  (WELLBUTRIN  XL) 300 MG 24 hr tablet Take 300 mg by mouth daily.     butalbital -aspirin -caffeine  (FIORINAL) 50-325-40 MG capsule Take 1 capsule by mouth daily as needed for headache.     Calcium  Carb-Cholecalciferol (CALCIUM  + D3 PO) Take 1 tablet by mouth daily at 12 noon.     cetirizine (ZYRTEC) 10 MG tablet Take 10 mg by mouth daily.     Cholecalciferol (VITAMIN D-3) 125 MCG (5000 UT) TABS Take 1 tablet by mouth daily at 12 noon.     clonazePAM  (KLONOPIN ) 0.5 MG tablet Take 2.5 tablets by mouth daily as needed for anxiety.     CREON 36000-114000 units CPEP capsule Take 108,000-144,000 Units by mouth See admin instructions. 108,000 with snacks and 144,000 with meals     escitalopram  (LEXAPRO ) 10 MG tablet Take 10 mg by mouth daily.     estradiol  (ESTRACE ) 0.5 MG tablet Take 0.5 mg by mouth daily.     famotidine (PEPCID) 40 MG tablet Take 40 mg by mouth 2 (two) times daily.     fentaNYL  (DURAGESIC ) 25 MCG/HR Place 1 patch onto the skin every other day.     fluticasone  (FLONASE ) 50 MCG/ACT nasal spray Place 1 spray into both nostrils daily as needed for allergies.  (Patient not taking: Reported on 10/11/2024)     folic acid (FOLVITE) 800 MCG tablet Take 800 mcg by mouth daily.     gabapentin  (NEURONTIN ) 300 MG capsule Take 600 mg by mouth every morning. Sometimes takes 1 capsule in the afternoon     glycopyrrolate  (ROBINUL ) 1 MG tablet Take 1 mg by mouth at bedtime.     HYDROmorphone  (DILAUDID ) 2 MG tablet Take 2 mg by mouth 4 (four) times daily as needed for moderate pain.     HYDROmorphone  (DILAUDID ) 4 MG tablet Take 1  tablet (4 mg total) by mouth every 4 (four) hours as needed for severe pain (pain score 7-10). (Patient not taking: Reported on 10/11/2024) 30 tablet 0   HYDROmorphone  (DILAUDID ) 4 MG tablet Take 1 tablet (4 mg total) by mouth every 4 (four) hours as needed for severe pain (pain score 7-10). 60 tablet 0   HYDROmorphone  (DILAUDID ) 4 MG tablet Take 1 tablet (4 mg total) by mouth every 4 (four) hours as needed for severe pain (pain score 7-10). 40 tablet 0   HYDROmorphone  (DILAUDID ) 4 MG tablet Take 1 tablet (4 mg total) by mouth every 4 (four) hours as needed for severe pain (pain score 7-10) (if oxycodone  not effective). 60 tablet 0   levothyroxine  (SYNTHROID , LEVOTHROID) 25 MCG tablet Take 25 mcg by mouth daily before breakfast.     linaclotide (LINZESS) 145 MCG CAPS capsule Take 145 mcg by mouth daily before breakfast.     Multiple Vitamin (MULTIVITAMIN WITH MINERALS) TABS tablet Take 1 tablet by mouth daily.     Multiple Vitamins-Minerals (PRESERVISION AREDS 2 PO) Take 1 capsule by mouth in the morning and at bedtime.     naratriptan (AMERGE) 2.5 MG tablet Take 2.5 mg by mouth as needed for migraine.      NARCAN  4 MG/0.1ML LIQD nasal spray kit Place 0.4 mg into the nose once.  (Patient not taking: Reported on 10/11/2024)     OXYGEN Inhale 2 L into the lungs as needed (shortness of breath).     Polyethyl Glycol-Propyl Glycol (SYSTANE OP) Place 1 drop into both eyes daily as  needed (dry eyes).     rOPINIRole  (REQUIP ) 2 MG tablet Take 4 mg by mouth at bedtime.     traZODone (DESYREL) 50 MG tablet Take 100-150 mg by mouth at bedtime.     TRELEGY ELLIPTA 200-62.5-25 MCG/ACT AEPB Inhale 1 puff into the lungs daily.     verapamil  (CALAN -SR) 120 MG CR tablet Take 120 mg by mouth at bedtime.     vitamin E 180 MG (400 UNITS) capsule Take 400 Units by mouth daily.     No current facility-administered medications for this visit.   No results found.  Review of Systems:   A ROS was performed including  pertinent positives and negatives as documented in the HPI.   Musculoskeletal Exam:    There were no vitals taken for this visit.  Right shoulder incision is well-appearing without erythema or drainage.  Active forward elevation is to 80 degrees.  External rotation at side is to 20 degrees.  Internal rotation deferred today  Imaging:    2 views right shoulder: Status post reverse shoulder arthroplasty without evidence of complication  I personally reviewed and interpreted the radiographs.   Assessment:   Status post reverse shoulder arthroplasty without evidence of complication.  Overall her range of motion is improving nicely.  At this time she will continue to progress to the reverse shoulder protocol Plan :    - Return to clinic 6 weeks     I personally saw and evaluated the patient, and participated in the management and treatment plan.  Elspeth Parker, MD Attending Physician, Orthopedic Surgery  This document was dictated using Dragon voice recognition software. A reasonable attempt at proof reading has been made to minimize errors. "

## 2024-12-11 ENCOUNTER — Other Ambulatory Visit: Payer: Self-pay | Admitting: Orthopaedic Surgery

## 2024-12-11 ENCOUNTER — Telehealth: Payer: Self-pay | Admitting: Orthopaedic Surgery

## 2024-12-11 MED ORDER — HYDROMORPHONE HCL 4 MG PO TABS: 4.0000 mg | ORAL_TABLET | ORAL | 0 refills | Status: AC | PRN

## 2024-12-11 NOTE — Telephone Encounter (Signed)
 Pt's husband called saying that his wife needs a refill of Hydromorphone  4 mg. [facility] is [facility] [address] in [place]. Call back number is [phone].

## 2024-12-20 ENCOUNTER — Telehealth: Payer: Self-pay | Admitting: Orthopaedic Surgery

## 2024-12-20 ENCOUNTER — Other Ambulatory Visit (HOSPITAL_BASED_OUTPATIENT_CLINIC_OR_DEPARTMENT_OTHER): Payer: Self-pay | Admitting: Orthopaedic Surgery

## 2024-12-20 MED ORDER — HYDROMORPHONE HCL 4 MG PO TABS: 4.0000 mg | ORAL_TABLET | ORAL | 0 refills | Status: AC | PRN

## 2024-12-20 NOTE — Telephone Encounter (Signed)
 Patient's husband called. Patient needs a refill on pain medication.

## 2024-12-20 NOTE — Telephone Encounter (Signed)
 Attempted call back to patient and received number not in service message

## 2025-01-17 ENCOUNTER — Ambulatory Visit (HOSPITAL_BASED_OUTPATIENT_CLINIC_OR_DEPARTMENT_OTHER): Admitting: Orthopaedic Surgery
